# Patient Record
Sex: Male | Born: 1935
Health system: Southern US, Community
[De-identification: ages and names within clinical notes are randomized; demographics above are authoritative.]

## PROBLEM LIST (undated history)

## (undated) DIAGNOSIS — I639 Cerebral infarction, unspecified: Secondary | ICD-10-CM

## (undated) DIAGNOSIS — E119 Type 2 diabetes mellitus without complications: Secondary | ICD-10-CM

## (undated) DIAGNOSIS — C44611 Basal cell carcinoma of skin of unspecified upper limb, including shoulder: Secondary | ICD-10-CM

## (undated) DIAGNOSIS — M1990 Unspecified osteoarthritis, unspecified site: Secondary | ICD-10-CM

## (undated) DIAGNOSIS — C44310 Basal cell carcinoma of skin of unspecified parts of face: Secondary | ICD-10-CM

## (undated) DIAGNOSIS — G252 Other specified forms of tremor: Secondary | ICD-10-CM

## (undated) DIAGNOSIS — R202 Paresthesia of skin: Secondary | ICD-10-CM

## (undated) DIAGNOSIS — G43909 Migraine, unspecified, not intractable, without status migrainosus: Secondary | ICD-10-CM

## (undated) DIAGNOSIS — R079 Chest pain, unspecified: Secondary | ICD-10-CM

## (undated) HISTORY — PX: TONSILLECTOMY: SUR1361

## (undated) HISTORY — DX: Other specified forms of tremor: G25.2

## (undated) HISTORY — PX: HAMMER TOE SURGERY: SHX385

## (undated) HISTORY — PX: JOINT REPLACEMENT: SHX530

## (undated) HISTORY — DX: Chest pain, unspecified: R07.9

## (undated) HISTORY — DX: Paresthesia of skin: R20.2

## (undated) HISTORY — PX: BACK SURGERY: SHX140

## (undated) HISTORY — PX: LUMBAR LAMINECTOMY: SHX95

## (undated) HISTORY — DX: Type 2 diabetes mellitus without complications: E11.9

## (undated) HISTORY — DX: Unspecified osteoarthritis, unspecified site: M19.90

---

## 1998-07-13 ENCOUNTER — Ambulatory Visit (HOSPITAL_COMMUNITY): Admission: RE | Admit: 1998-07-13 | Discharge: 1998-07-13 | Payer: Self-pay | Admitting: Orthopaedic Surgery

## 1998-09-16 ENCOUNTER — Ambulatory Visit (HOSPITAL_COMMUNITY): Admission: RE | Admit: 1998-09-16 | Discharge: 1998-09-17 | Payer: Self-pay | Admitting: Orthopaedic Surgery

## 1999-07-13 ENCOUNTER — Ambulatory Visit (HOSPITAL_COMMUNITY): Admission: RE | Admit: 1999-07-13 | Discharge: 1999-07-13 | Payer: Self-pay | Admitting: Orthopaedic Surgery

## 2000-02-22 HISTORY — PX: KNEE ARTHROSCOPY: SUR90

## 2000-02-24 ENCOUNTER — Ambulatory Visit (HOSPITAL_COMMUNITY): Admission: RE | Admit: 2000-02-24 | Discharge: 2000-02-24 | Payer: Self-pay | Admitting: Orthopaedic Surgery

## 2000-04-05 ENCOUNTER — Inpatient Hospital Stay (HOSPITAL_COMMUNITY): Admission: AD | Admit: 2000-04-05 | Discharge: 2000-04-06 | Payer: Self-pay | Admitting: Orthopaedic Surgery

## 2003-02-19 ENCOUNTER — Encounter: Payer: Self-pay | Admitting: Internal Medicine

## 2003-02-22 DIAGNOSIS — I639 Cerebral infarction, unspecified: Secondary | ICD-10-CM

## 2003-02-22 HISTORY — DX: Cerebral infarction, unspecified: I63.9

## 2003-04-02 ENCOUNTER — Encounter: Payer: Self-pay | Admitting: Internal Medicine

## 2004-05-25 ENCOUNTER — Ambulatory Visit: Payer: Self-pay | Admitting: Internal Medicine

## 2004-10-15 ENCOUNTER — Ambulatory Visit: Payer: Self-pay | Admitting: Internal Medicine

## 2004-10-19 ENCOUNTER — Encounter: Payer: Self-pay | Admitting: Internal Medicine

## 2004-11-03 ENCOUNTER — Encounter: Payer: Self-pay | Admitting: Internal Medicine

## 2005-10-14 ENCOUNTER — Encounter: Payer: Self-pay | Admitting: Internal Medicine

## 2006-12-07 ENCOUNTER — Ambulatory Visit: Payer: Self-pay | Admitting: Internal Medicine

## 2006-12-07 DIAGNOSIS — R209 Unspecified disturbances of skin sensation: Secondary | ICD-10-CM | POA: Insufficient documentation

## 2006-12-07 DIAGNOSIS — G250 Essential tremor: Secondary | ICD-10-CM

## 2006-12-07 DIAGNOSIS — G252 Other specified forms of tremor: Secondary | ICD-10-CM

## 2006-12-07 DIAGNOSIS — M1990 Unspecified osteoarthritis, unspecified site: Secondary | ICD-10-CM

## 2006-12-07 DIAGNOSIS — M179 Osteoarthritis of knee, unspecified: Secondary | ICD-10-CM | POA: Insufficient documentation

## 2007-04-30 ENCOUNTER — Ambulatory Visit: Payer: Self-pay | Admitting: Internal Medicine

## 2007-04-30 DIAGNOSIS — L500 Allergic urticaria: Secondary | ICD-10-CM | POA: Insufficient documentation

## 2007-10-22 ENCOUNTER — Encounter: Payer: Self-pay | Admitting: Internal Medicine

## 2007-10-23 ENCOUNTER — Ambulatory Visit: Payer: Self-pay | Admitting: Internal Medicine

## 2007-10-23 DIAGNOSIS — E119 Type 2 diabetes mellitus without complications: Secondary | ICD-10-CM

## 2007-10-23 DIAGNOSIS — IMO0002 Reserved for concepts with insufficient information to code with codable children: Secondary | ICD-10-CM | POA: Insufficient documentation

## 2007-10-23 DIAGNOSIS — E1065 Type 1 diabetes mellitus with hyperglycemia: Secondary | ICD-10-CM

## 2007-10-23 HISTORY — DX: Type 2 diabetes mellitus without complications: E11.9

## 2007-10-23 LAB — CONVERTED CEMR LAB
ALT: 35 units/L (ref 0–53)
AST: 22 units/L (ref 0–37)
Albumin: 3.4 g/dL — ABNORMAL LOW (ref 3.5–5.2)
Alkaline Phosphatase: 99 units/L (ref 39–117)
BUN: 18 mg/dL (ref 6–23)
Basophils Absolute: 0 10*3/uL (ref 0.0–0.1)
Basophils Relative: 0.5 % (ref 0.0–3.0)
Bilirubin, Direct: 0.1 mg/dL (ref 0.0–0.3)
CO2: 28 meq/L (ref 19–32)
Calcium: 9 mg/dL (ref 8.4–10.5)
Chloride: 101 meq/L (ref 96–112)
Creatinine, Ser: 1 mg/dL (ref 0.4–1.5)
Eosinophils Absolute: 0 10*3/uL (ref 0.0–0.7)
Eosinophils Relative: 1.2 % (ref 0.0–5.0)
GFR calc Af Amer: 95 mL/min
GFR calc non Af Amer: 79 mL/min
Glucose, Bld: 719 mg/dL (ref 70–99)
HCT: 44.3 % (ref 39.0–52.0)
Hemoglobin: 15.2 g/dL (ref 13.0–17.0)
Hgb A1c MFr Bld: 15.7 % — ABNORMAL HIGH (ref 4.6–6.0)
Lymphocytes Relative: 20.1 % (ref 12.0–46.0)
MCHC: 34.3 g/dL (ref 30.0–36.0)
MCV: 89.2 fL (ref 78.0–100.0)
Monocytes Absolute: 0.3 10*3/uL (ref 0.1–1.0)
Monocytes Relative: 7.9 % (ref 3.0–12.0)
Neutro Abs: 2.9 10*3/uL (ref 1.4–7.7)
Neutrophils Relative %: 70.3 % (ref 43.0–77.0)
Platelets: 175 10*3/uL (ref 150–400)
Potassium: 5.7 meq/L — ABNORMAL HIGH (ref 3.5–5.1)
RBC: 4.97 M/uL (ref 4.22–5.81)
RDW: 12.7 % (ref 11.5–14.6)
Sodium: 132 meq/L — ABNORMAL LOW (ref 135–145)
Total Bilirubin: 1.1 mg/dL (ref 0.3–1.2)
Total Protein: 6.8 g/dL (ref 6.0–8.3)
WBC: 4 10*3/uL — ABNORMAL LOW (ref 4.5–10.5)

## 2007-10-30 ENCOUNTER — Ambulatory Visit: Payer: Self-pay | Admitting: Internal Medicine

## 2007-11-27 ENCOUNTER — Encounter: Payer: Self-pay | Admitting: Internal Medicine

## 2007-11-27 ENCOUNTER — Encounter: Admission: RE | Admit: 2007-11-27 | Discharge: 2007-11-27 | Payer: Self-pay | Admitting: Internal Medicine

## 2007-11-28 ENCOUNTER — Telehealth: Payer: Self-pay | Admitting: Internal Medicine

## 2007-12-05 ENCOUNTER — Ambulatory Visit: Payer: Self-pay | Admitting: Internal Medicine

## 2007-12-27 ENCOUNTER — Telehealth: Payer: Self-pay | Admitting: Internal Medicine

## 2008-01-21 ENCOUNTER — Ambulatory Visit: Payer: Self-pay | Admitting: Internal Medicine

## 2008-01-21 LAB — CONVERTED CEMR LAB
Creatinine,U: 30.3 mg/dL
Hgb A1c MFr Bld: 5.9 % (ref 4.6–6.0)
Microalb, Ur: 0.2 mg/dL (ref 0.0–1.9)

## 2008-02-25 ENCOUNTER — Ambulatory Visit: Payer: Self-pay | Admitting: Internal Medicine

## 2008-04-01 ENCOUNTER — Encounter: Payer: Self-pay | Admitting: Internal Medicine

## 2008-04-10 ENCOUNTER — Encounter: Payer: Self-pay | Admitting: Internal Medicine

## 2008-04-12 ENCOUNTER — Emergency Department (HOSPITAL_COMMUNITY): Admission: EM | Admit: 2008-04-12 | Discharge: 2008-04-12 | Payer: Self-pay | Admitting: Emergency Medicine

## 2008-04-22 ENCOUNTER — Encounter: Payer: Self-pay | Admitting: Internal Medicine

## 2008-05-27 ENCOUNTER — Ambulatory Visit: Payer: Self-pay | Admitting: Internal Medicine

## 2008-05-27 LAB — CONVERTED CEMR LAB: Hgb A1c MFr Bld: 5.7 % (ref 4.6–6.5)

## 2008-07-11 ENCOUNTER — Ambulatory Visit: Payer: Self-pay | Admitting: Family Medicine

## 2008-07-11 DIAGNOSIS — L03119 Cellulitis of unspecified part of limb: Secondary | ICD-10-CM

## 2008-07-11 DIAGNOSIS — L02619 Cutaneous abscess of unspecified foot: Secondary | ICD-10-CM | POA: Insufficient documentation

## 2008-07-12 ENCOUNTER — Encounter: Payer: Self-pay | Admitting: Family Medicine

## 2008-07-17 ENCOUNTER — Ambulatory Visit: Payer: Self-pay | Admitting: Internal Medicine

## 2008-07-17 DIAGNOSIS — L84 Corns and callosities: Secondary | ICD-10-CM | POA: Insufficient documentation

## 2008-08-04 ENCOUNTER — Encounter: Payer: Self-pay | Admitting: Internal Medicine

## 2008-08-12 ENCOUNTER — Encounter: Payer: Self-pay | Admitting: Internal Medicine

## 2008-08-26 ENCOUNTER — Ambulatory Visit: Payer: Self-pay | Admitting: Internal Medicine

## 2008-08-29 ENCOUNTER — Encounter: Payer: Self-pay | Admitting: Internal Medicine

## 2008-09-02 ENCOUNTER — Encounter: Payer: Self-pay | Admitting: Internal Medicine

## 2008-09-02 ENCOUNTER — Ambulatory Visit: Payer: Self-pay

## 2008-11-19 ENCOUNTER — Ambulatory Visit: Payer: Self-pay | Admitting: Internal Medicine

## 2008-11-19 DIAGNOSIS — I951 Orthostatic hypotension: Secondary | ICD-10-CM | POA: Insufficient documentation

## 2008-11-19 LAB — CONVERTED CEMR LAB: Hgb A1c MFr Bld: 5.4 % (ref 4.6–6.5)

## 2008-12-15 ENCOUNTER — Ambulatory Visit: Payer: Self-pay | Admitting: Internal Medicine

## 2009-02-26 ENCOUNTER — Ambulatory Visit: Payer: Self-pay | Admitting: Internal Medicine

## 2009-04-13 ENCOUNTER — Ambulatory Visit: Payer: Self-pay | Admitting: Internal Medicine

## 2009-07-13 ENCOUNTER — Ambulatory Visit: Payer: Self-pay | Admitting: Internal Medicine

## 2009-07-13 LAB — CONVERTED CEMR LAB: Hgb A1c MFr Bld: 5.6 % (ref 4.6–6.5)

## 2009-11-10 ENCOUNTER — Ambulatory Visit: Payer: Self-pay | Admitting: Internal Medicine

## 2009-11-10 DIAGNOSIS — R519 Headache, unspecified: Secondary | ICD-10-CM | POA: Insufficient documentation

## 2009-11-10 DIAGNOSIS — R51 Headache: Secondary | ICD-10-CM

## 2009-11-10 LAB — CONVERTED CEMR LAB: Blood Glucose, Fingerstick: 147

## 2010-03-21 LAB — CONVERTED CEMR LAB
ALT: 17 units/L (ref 0–53)
ALT: 27 units/L (ref 0–53)
AST: 19 units/L (ref 0–37)
AST: 20 units/L (ref 0–37)
Albumin: 3.6 g/dL (ref 3.5–5.2)
Alkaline Phosphatase: 61 units/L (ref 39–117)
BUN: 13 mg/dL (ref 6–23)
BUN: 19 mg/dL (ref 6–23)
Basophils Absolute: 0 10*3/uL (ref 0.0–0.1)
Basophils Relative: 0.7 % (ref 0.0–1.0)
Bilirubin, Direct: 0 mg/dL (ref 0.0–0.3)
Bilirubin, Direct: 0.2 mg/dL (ref 0.0–0.3)
CO2: 26 meq/L (ref 19–32)
Calcium: 9.5 mg/dL (ref 8.4–10.5)
Chloride: 110 meq/L (ref 96–112)
Cholesterol: 227 mg/dL (ref 0–200)
Cholesterol: 228 mg/dL — ABNORMAL HIGH (ref 0–200)
Creatinine, Ser: 1 mg/dL (ref 0.4–1.5)
Creatinine, Ser: 1 mg/dL (ref 0.4–1.5)
Direct LDL: 119.3 mg/dL
Eosinophils Absolute: 0.1 10*3/uL (ref 0.0–0.6)
Eosinophils Relative: 1.4 % (ref 0.0–5.0)
Eosinophils Relative: 1.8 % (ref 0.0–5.0)
Folate: 14.5 ng/mL
GFR calc Af Amer: 96 mL/min
GFR calc non Af Amer: 78.3 mL/min (ref >60)
GFR calc non Af Amer: 79 mL/min
Glucose, Bld: 122 mg/dL — ABNORMAL HIGH (ref 70–99)
HCT: 42.6 % (ref 39.0–52.0)
HDL: 33.1 mg/dL — ABNORMAL LOW (ref >39.0)
HDL: 38.7 mg/dL — ABNORMAL LOW (ref >39.00)
Hemoglobin: 15.1 g/dL (ref 13.0–17.0)
Lymphocytes Relative: 25.3 % (ref 12.0–46.0)
MCHC: 35.4 g/dL (ref 30.0–36.0)
MCV: 89.7 fL (ref 78.0–100.0)
Monocytes Absolute: 0.5 10*3/uL (ref 0.1–1.0)
Monocytes Absolute: 0.5 10*3/uL (ref 0.2–0.7)
Monocytes Relative: 10.4 % (ref 3.0–12.0)
Monocytes Relative: 9 % (ref 3.0–11.0)
Neutro Abs: 3.2 10*3/uL (ref 1.4–7.7)
Neutrophils Relative %: 61.8 % (ref 43.0–77.0)
Neutrophils Relative %: 63.6 % (ref 43.0–77.0)
PSA: 0.45 ng/mL (ref 0.10–4.00)
PSA: 0.51 ng/mL (ref 0.10–4.00)
Platelets: 191 10*3/uL (ref 150.0–400.0)
Platelets: 213 10*3/uL (ref 150–400)
Potassium: 4.5 meq/L (ref 3.5–5.1)
RBC: 4.75 M/uL (ref 4.22–5.81)
RDW: 12.7 % (ref 11.5–14.6)
Sodium: 142 meq/L (ref 135–145)
TSH: 2.17 microintl units/mL (ref 0.35–5.50)
Total Bilirubin: 0.7 mg/dL (ref 0.3–1.2)
Total Bilirubin: 1.2 mg/dL (ref 0.3–1.2)
Total CHOL/HDL Ratio: 6.9
Total Protein: 6.5 g/dL (ref 6.0–8.3)
Triglycerides: 352 mg/dL (ref 0–149)
VLDL: 61.2 mg/dL — ABNORMAL HIGH (ref 0.0–40.0)
VLDL: 70 mg/dL — ABNORMAL HIGH (ref 0–40)
Vitamin B-12: 250 pg/mL (ref 211–911)
WBC: 5.1 10*3/uL (ref 4.5–10.5)
WBC: 5.1 10*3/uL (ref 4.5–10.5)

## 2010-03-23 NOTE — Assessment & Plan Note (Signed)
Summary: HEADACHE // RS   Vital Signs:  Patient profile:   75 year old male Weight:      202 pounds Temp:     97.8 degrees F oral BP sitting:   110 / 80  (right arm) Cuff size:   regular  Vitals Entered By: Duard Brady LPN (November 10, 2009 8:04 AM) CC: c/o headache - on saturday - severe and with visual distrubance Is Patient Diabetic? Yes CBG Result 147  Vision Screening:Left eye w/o correction: 20 / 60 Right Eye w/o correction: 20 / 60 Both eyes w/o correction:  20/ 40        Vision Entered By: Duard Brady LPN (November 10, 2009 8:41 AM) Flu Vaccine Consent Questions     Do you have a history of severe allergic reactions to this vaccine? no    Any prior history of allergic reactions to egg and/or gelatin? no    Do you have a sensitivity to the preservative Thimersol? no    Do you have a past history of Guillan-Barre Syndrome? no    Do you currently have an acute febrile illness? no    Have you ever had a severe reaction to latex? no    Vaccine information given and explained to patient? yes    Are you currently pregnant? no    Lot Number:[id number]   Exp Date:08/21/2010   Site Given  Left Deltoid IM   CC:  c/o headache - on saturday - severe and with visual distrubance.  History of Present Illness: 75 -year-old patient who is seen today for follow-up of his type 2 diabetes.  Three days ago he began having some headaches associated with some visual disturbances.  That lasted approximately 30 minutes.  This occurred while he was out shopping over the weekend.  He continues to have some mild intermittent headaches in the frontal region.  His vision is back to baseline.  Headaches are relieved by aspirin.  He has type 2 diabetes which has been stable.  Hemoglobin A1c is have been in a non-diabetic range.  Allergies (verified): No Known Drug Allergies  Past History:  Past Medical History: Reviewed history from 02/25/2008 and no changes  required. Osteoarthritis Diabetes mellitus, type II  onset September 2009 intention tremor paresthesias of the feet  Family History: Reviewed history from 10/23/2007 and no changes required. mother died at 56- history of impaired glucose tolerance father died age 57, MI two brothers two sisters one brother deceased of a melanoma; history of diabetes, type II  Social History: Reviewed history from 12/15/2008 and no changes required. Never Smoked Regular exercise-yes  Review of Systems       The patient complains of vision loss and headaches.  The patient denies anorexia, fever, weight loss, weight gain, decreased hearing, hoarseness, chest pain, syncope, dyspnea on exertion, peripheral edema, prolonged cough, hemoptysis, abdominal pain, melena, hematochezia, severe indigestion/heartburn, hematuria, incontinence, genital sores, muscle weakness, suspicious skin lesions, transient blindness, difficulty walking, depression, unusual weight change, abnormal bleeding, enlarged lymph nodes, angioedema, breast masses, and testicular masses.    Physical Exam  General:  Well-developed,well-nourished,in no acute distress; alert,appropriate and cooperative throughout examination Head:  Normocephalic and atraumatic without obvious abnormalities. No apparent alopecia or balding. Eyes:  No corneal or conjunctival inflammation noted. EOMI. Perrla. Funduscopic exam benign, without hemorrhages, exudates or papilledema. Vision grossly normal. 20/40 OU Mouth:  Oral mucosa and oropharynx without lesions or exudates.  Teeth in good repair. Neck:  No deformities, masses, or tenderness noted. Lungs:  Normal respiratory effort, chest expands symmetrically. Lungs are clear to auscultation, no crackles or wheezes. Heart:  Normal rate and regular rhythm. S1 and S2 normal without gallop, murmur, click, rub or other extra sounds. Abdomen:  Bowel sounds positive,abdomen soft and non-tender without masses,  organomegaly or hernias noted. Msk:  No deformity or scoliosis noted of thoracic or lumbar spine.   Extremities:  No clubbing, cyanosis, edema, or deformity noted with normal full range of motion of all joints.    Diabetes Management Exam:    Eye Exam:       Eye Exam done here today          Results: normal   Impression & Recommendations:  Problem # 1:  DIABETES MELLITUS, TYPE II (ICD-250.00)  His updated medication list for this problem includes:    Glyburide-metformin 2.5-500 Mg Tabs (Glyburide-metformin) ..... One twice daily  Orders: Capillary Blood Glucose/CBG (16109) Venipuncture (60454) TLB-A1C / Hgb A1C (Glycohemoglobin) (83036-A1C) Specimen Handling (09811)  His updated medication list for this problem includes:    Glyburide-metformin 2.5-500 Mg Tabs (Glyburide-metformin) ..... One twice daily  Problem # 2:  HEADACHE (ICD-784.0)  Complete Medication List: 1)  Triamcinolone Acetonide 0.1 % Crea (Triamcinolone acetonide) .... Use twice daily 2)  Glyburide-metformin 2.5-500 Mg Tabs (Glyburide-metformin) .... One twice daily 3)  Bd Ultra-fine Pen Needles 29g X 12.73mm Misc (Insulin pen needle) .... Use daily 4)  Accu-chek Aviva Strp (Glucose blood) .... Use daily  Other Orders: Flu Vaccine 5yrs + MEDICARE PATIENTS (B1478) Administration Flu vaccine - MCR (G9562)  Patient Instructions: 1)  ophthalmology evaluation this week 2)  call if  visual disturbances recur 3)  Take an Aspirin every day. 4)  Please schedule a follow-up appointment in 3 months. 5)  Limit your Sodium (Salt). 6)  It is important that you exercise regularly at least 20 minutes 5 times a week. If you develop chest pain, have severe difficulty breathing, or feel very tired , stop exercising immediately and seek medical attention. Prescriptions: ACCU-CHEK AVIVA  STRP (GLUCOSE BLOOD) use daily Brand medically necessary #100 x 7   Entered and Authorized by:   Gordy Savers  MD   Signed by:   Gordy Savers  MD on 11/10/2009   Method used:   Electronically to        [facility] (retail)       [address]       [place], [place]  [address]       Ph: [phone] or [phone]       Fax: [phone]   RxID:   [id number] BD ULTRA-FINE PEN NEEDLES 29G X 12.7MM MISC (INSULIN PEN NEEDLE) use daily  #100 x 5   Entered and Authorized by:   Gordy Savers  MD   Signed by:   Gordy Savers  MD on 11/10/2009   Method used:   Electronically to        [facility] (retail)       [address]       [place], [place]  [address]       Ph: [phone] or [phone]       Fax: [phone]   RxID:   [id number] GLYBURIDE-METFORMIN 2.5-500 MG TABS (GLYBURIDE-METFORMIN) one twice daily  #180 Tablet x 5   Entered and Authorized by:   Gordy Savers  MD   Signed by:   Gordy Savers  MD on 11/10/2009   Method used:  Electronically to        [facility]* (retail)       [address]       [place], [place]  [address]       Ph: [phone] or [phone]       Fax: [phone]   RxID:   [id number]

## 2010-03-23 NOTE — Assessment & Plan Note (Signed)
Summary: 3 MONTH ROV/NJR   Vital Signs:  Patient profile:   75 year old male Weight:      208 pounds Temp:     98.5 degrees F oral BP sitting:   118 / 70  (right arm) Cuff size:   regular  Vitals Entered By: Duard Brady LPN (Jul 13, 2009 8:57 AM) CC: rov - doing well       fbs87 Is Patient Diabetic? Yes Did you bring your meter with you today? No   CC:  rov - doing well       fbs87.  History of Present Illness: 75 year old patient who has a history of type 2 diabetes.  Last visit his Lantus was titrated and presently is on oral agents only.  He has osteoarthritis, which has been fairly stable.  No concerns or complaints today.  He has had no hypoglycemic reactions.  His weight is down 2 pounds.  He continues to need much better avoiding fried foods and starches  Allergies (verified): No Known Drug Allergies  Past History:  Past Medical History: Reviewed history from 02/25/2008 and no changes required. Osteoarthritis Diabetes mellitus, type II  onset September 2009 intention tremor paresthesias of the feet  Review of Systems  The patient denies anorexia, fever, weight loss, weight gain, vision loss, decreased hearing, hoarseness, chest pain, syncope, dyspnea on exertion, peripheral edema, prolonged cough, headaches, hemoptysis, abdominal pain, melena, hematochezia, severe indigestion/heartburn, hematuria, incontinence, genital sores, muscle weakness, suspicious skin lesions, transient blindness, difficulty walking, depression, unusual weight change, abnormal bleeding, enlarged lymph nodes, angioedema, breast masses, and testicular masses.    Physical Exam  General:  Well-developed,well-nourished,in no acute distress; alert,appropriate and cooperative throughout examination Head:  Normocephalic and atraumatic without obvious abnormalities. No apparent alopecia or balding. Eyes:  No corneal or conjunctival inflammation noted. EOMI. Perrla. Funduscopic exam benign,  without hemorrhages, exudates or papilledema. Vision grossly normal. Mouth:  Oral mucosa and oropharynx without lesions or exudates.  Teeth in good repair. Neck:  No deformities, masses, or tenderness noted. Lungs:  Normal respiratory effort, chest expands symmetrically. Lungs are clear to auscultation, no crackles or wheezes. Heart:  Normal rate and regular rhythm. S1 and S2 normal without gallop, murmur, click, rub or other extra sounds. Abdomen:  Bowel sounds positive,abdomen soft and non-tender without masses, organomegaly or hernias noted. Msk:  No deformity or scoliosis noted of thoracic or lumbar spine.   Pulses:  R and L carotid,radial,femoral,dorsalis pedis and posterior tibial pulses are full and equal bilaterally   Impression & Recommendations:  Problem # 1:  DIABETES MELLITUS, TYPE II (ICD-250.00)  His updated medication list for this problem includes:    Glyburide-metformin 2.5-500 Mg Tabs (Glyburide-metformin) ..... One twice daily    His updated medication list for this problem includes:    Glyburide-metformin 2.5-500 Mg Tabs (Glyburide-metformin) ..... One twice daily  Problem # 2:  OSTEOARTHRITIS (ICD-715.90)  Complete Medication List: 1)  Triamcinolone Acetonide 0.1 % Crea (Triamcinolone acetonide) .... Use twice daily 2)  Glyburide-metformin 2.5-500 Mg Tabs (Glyburide-metformin) .... One twice daily 3)  Bd Ultra-fine Pen Needles 29g X 12.39mm Misc (Insulin pen needle) .... Use daily 4)  Accu-chek Aviva Strp (Glucose blood) .... Use daily  Other Orders: Venipuncture (88416) TLB-A1C / Hgb A1C (Glycohemoglobin) (83036-A1C) Prescription Created Electronically [id number])  Patient Instructions: 1)  Please schedule a follow-up appointment in 4 months. 2)  Limit your Sodium (Salt). 3)  It is important that you exercise regularly at least 20 minutes 5 times a week.  If you develop chest pain, have severe difficulty breathing, or feel very tired , stop exercising immediately  and seek medical attention. 4)  Check your blood sugars regularly. If your readings are usually above : or below 70 you should contact our office. 5)  It is important that your Diabetic A1c level is checked every 3 months. 6)  See your eye doctor yearly to check for diabetic eye damage. Prescriptions: ACCU-CHEK AVIVA  STRP (GLUCOSE BLOOD) use daily Brand medically necessary #100 x 7   Entered and Authorized by:   Gordy Savers  MD   Signed by:   Gordy Savers  MD on 07/13/2009   Method used:   Electronically to        [facility] (retail)       [address]       [place], [place]  [address]       Ph: [phone] or [phone]       Fax: [phone]   RxID:   [id number] BD ULTRA-FINE PEN NEEDLES 29G X 12.7MM MISC (INSULIN PEN NEEDLE) use daily  #100 x 5   Entered and Authorized by:   Gordy Savers  MD   Signed by:   Gordy Savers  MD on 07/13/2009   Method used:   Electronically to        [facility] (retail)       [address]       [place], [place]  [address]       Ph: [phone] or [phone]       Fax: [phone]   RxID:   [id number] GLYBURIDE-METFORMIN 2.5-500 MG TABS (GLYBURIDE-METFORMIN) one twice daily  #180 Tablet x 5   Entered and Authorized by:   Gordy Savers  MD   Signed by:   Gordy Savers  MD on 07/13/2009   Method used:   Electronically to        [facility] (retail)       [address]       [place], [place]  [address]       Ph: [phone] or [phone]       Fax: [phone]   RxID:   [id number]

## 2010-03-23 NOTE — Assessment & Plan Note (Signed)
Summary: ROA X 4 MTHS / RS   Vital Signs:  Patient profile:   75 year old male Weight:      213 pounds Temp:     98.6 degrees F BP sitting:   120 / 70  (left arm) Cuff size:   regular  Vitals Entered By: Duard Brady LPN (April 13, 2009 8:36 AM) CC: 4 mos rov    fbs 67 Is Patient Diabetic? Yes   CC:  4 mos rov    fbs 67.  History of Present Illness: 59 -year-old male, who is seen today for follow-up of his diabetes.  His Lantus has been down titrated to 12 units at bedtime.  He remains on oral medications.  Denies any frank hypoglycemia, but does state that some fasting blood sugars are in the 60s.  His last two hemoglobin A1c's have been 5.4.  Otherwise, doing quite well.  He has a history of arthritis, which has been stable.  Preventive Screening-Counseling & Management  Alcohol-Tobacco     Smoking Status: never  Allergies: No Known Drug Allergies  Past History:  Past Medical History: Reviewed history from 02/25/2008 and no changes required. Osteoarthritis Diabetes mellitus, type II  onset September 2009 intention tremor paresthesias of the feet  Review of Systems  The patient denies anorexia, fever, weight loss, weight gain, vision loss, decreased hearing, hoarseness, chest pain, syncope, dyspnea on exertion, peripheral edema, prolonged cough, headaches, hemoptysis, abdominal pain, melena, hematochezia, severe indigestion/heartburn, hematuria, incontinence, genital sores, muscle weakness, suspicious skin lesions, transient blindness, difficulty walking, depression, unusual weight change, abnormal bleeding, enlarged lymph nodes, angioedema, breast masses, and testicular masses.    Physical Exam  General:  Well-developed,well-nourished,in no acute distress; alert,appropriate and cooperative throughout examination Eyes:  No corneal or conjunctival inflammation noted. EOMI. Perrla. Funduscopic exam benign, without hemorrhages, exudates or papilledema. Vision  grossly normal. Mouth:  Oral mucosa and oropharynx without lesions or exudates.  Teeth in good repair. Neck:  No deformities, masses, or tenderness noted. Lungs:  Normal respiratory effort, chest expands symmetrically. Lungs are clear to auscultation, no crackles or wheezes. Heart:  Normal rate and regular rhythm. S1 and S2 normal without gallop, murmur, click, rub or other extra sounds. Abdomen:  Bowel sounds positive,abdomen soft and non-tender without masses, organomegaly or hernias noted. Msk:  No deformity or scoliosis noted of thoracic or lumbar spine.   Pulses:  R and L carotid,radial,femoral,dorsalis pedis and posterior tibial pulses are full and equal bilaterally   Impression & Recommendations:  Problem # 1:  DIABETES MELLITUS, TYPE II (ICD-250.00)  The following medications were removed from the medication list:    Lantus Solostar 100 Unit/ml Soln (Insulin glargine) .Marland Kitchen... 14 units at bedtime His updated medication list for this problem includes:    Glyburide-metformin 2.5-500 Mg Tabs (Glyburide-metformin) ..... One twice daily    The following medications were removed from the medication list:    Lantus Solostar 100 Unit/ml Soln (Insulin glargine) .Marland Kitchen... 14 units at bedtime His updated medication list for this problem includes:    Glyburide-metformin 2.5-500 Mg Tabs (Glyburide-metformin) ..... One twice daily  Problem # 2:  OSTEOARTHRITIS (ICD-715.90)  Complete Medication List: 1)  Triamcinolone Acetonide 0.1 % Crea (Triamcinolone acetonide) .... Use twice daily 2)  Glyburide-metformin 2.5-500 Mg Tabs (Glyburide-metformin) .... One twice daily 3)  Bd Ultra-fine Pen Needles 29g X 12.2mm Misc (Insulin pen needle) .... Use daily 4)  Accu-chek Aviva Strp (Glucose blood) .... Use daily  Other Orders: Prescription Created Electronically [id number])  Patient  Instructions: 1)  Please schedule a follow-up appointment in 3 months. 2)  It is important that you exercise regularly at  least 20 minutes 5 times a week. If you develop chest pain, have severe difficulty breathing, or feel very tired , stop exercising immediately and seek medical attention. 3)  You need to lose weight. Consider a lower calorie diet and regular exercise.  4)  Check your blood sugars regularly. If your readings are usually above : or below 70 you should contact our office. 5)  It is important that your Diabetic A1c level is checked every 3 months. 6)  See your eye doctor yearly to check for diabetic eye damage. Prescriptions: ACCU-CHEK AVIVA  STRP (GLUCOSE BLOOD) use daily Brand medically necessary #100 x 7   Entered and Authorized by:   Gordy Savers  MD   Signed by:   Gordy Savers  MD on 04/13/2009   Method used:   Print then Give to Patient   RxID:   [id number] GLYBURIDE-METFORMIN 2.5-500 MG TABS (GLYBURIDE-METFORMIN) one twice daily  #180 Tablet x 5   Entered and Authorized by:   Gordy Savers  MD   Signed by:   Gordy Savers  MD on 04/13/2009   Method used:   Print then Give to Patient   RxID:   [id number] TRIAMCINOLONE ACETONIDE 0.1 %  CREA (TRIAMCINOLONE ACETONIDE) use twice daily  #120 gm x 2   Entered and Authorized by:   Gordy Savers  MD   Signed by:   Gordy Savers  MD on 04/13/2009   Method used:   Print then Give to Patient   RxID:   [id number] ACCU-CHEK AVIVA  STRP (GLUCOSE BLOOD) use daily Brand medically necessary #100 x 7   Entered and Authorized by:   Gordy Savers  MD   Signed by:   Gordy Savers  MD on 04/13/2009   Method used:   Electronically to        [facility] (retail)       [address]       [place], [place]  [address]       Ph: [phone] or [phone]       Fax: [phone]   RxID:   [id number] GLYBURIDE-METFORMIN 2.5-500 MG TABS (GLYBURIDE-METFORMIN) one twice daily  #180 Tablet x 5   Entered and Authorized by:   Gordy Savers  MD   Signed by:   Gordy Savers  MD on 04/13/2009   Method used:   Electronically to        [facility] (retail)       [address]       [place], [place]  [address]       Ph: [phone] or [phone]       Fax: [phone]   RxID:   [id number] TRIAMCINOLONE ACETONIDE 0.1 %  CREA (TRIAMCINOLONE ACETONIDE) use twice daily  #120 gm x 2   Entered and Authorized by:   Gordy Savers  MD   Signed by:   Gordy Savers  MD on 04/13/2009   Method used:   Electronically to        [facility] (retail)       [address]       [place], [place]  [address]       Ph: [phone] or [phone]       Fax: [phone]   RxID:   [id number]

## 2010-03-23 NOTE — Progress Notes (Signed)
Summary: [employer] [employer]   Imported By: Lennie Odor 08/13/2009 09:12:18  _____________________________________________________________________  External Attachment:    Type:   Image     Comment:   External Document

## 2010-03-23 NOTE — Letter (Signed)
Summary: Phone call from patient-wants lab results  Phone call from patient-wants lab results   Imported By: Maryln Gottron 07/16/2009 09:02:18  _____________________________________________________________________  External Attachment:    Type:   Image     Comment:   External Document

## 2010-03-23 NOTE — Op Note (Signed)
Summary: [employer] [employer]   Imported By: Lennie Odor 08/13/2009 09:10:35  _____________________________________________________________________  External Attachment:    Type:   Image     Comment:   External Document

## 2010-03-23 NOTE — Progress Notes (Signed)
Summary: Tyler Greer  Tyler Greer   Imported By: Maryln Gottron 07/15/2009 14:22:42  _____________________________________________________________________  External Attachment:    Type:   Image     Comment:   External Document

## 2010-03-23 NOTE — Progress Notes (Signed)
Summary: [employer] [employer]   Imported By: Lennie Odor 08/13/2009 09:13:17  _____________________________________________________________________  External Attachment:    Type:   Image     Comment:   External Document

## 2010-03-23 NOTE — Assessment & Plan Note (Signed)
Summary: [name]   Vital Signs:  Patient profile:   75 year old male Weight:      211 pounds BMI:     28.72 Temp:     98.0 degrees F oral BP sitting:   114 / 78  (left arm) Cuff size:   regular  Vitals Entered By: Raechel Ache, RN (February 26, 2009 1:14 PM) CC: C/o itchy rash on back and ankles. BS 70no. Is Patient Diabetic? Yes   CC:  C/o itchy rash on back and ankles. BS 70no.Tyler Greer  History of Present Illness: 75 year old patient who is seen today for follow-up of his type 2 diabetes.  He has history of urticaria Osteoarthritis.  doing quite well except for a very pruritic rash involving his upper back and right medial ankle.  His diabetes has been under excellent control.  Blood sugars fasting are often in the 70 range  Allergies: No Known Drug Allergies  Past History:  Past Medical History: Reviewed history from 02/25/2008 and no changes required. Osteoarthritis Diabetes mellitus, type II  onset September 2009 intention tremor paresthesias of the feet  Review of Systems       The patient complains of suspicious skin lesions.  The patient denies anorexia, fever, weight loss, weight gain, vision loss, decreased hearing, hoarseness, chest pain, syncope, dyspnea on exertion, peripheral edema, prolonged cough, headaches, hemoptysis, abdominal pain, melena, hematochezia, severe indigestion/heartburn, hematuria, incontinence, genital sores, muscle weakness, transient blindness, difficulty walking, depression, unusual weight change, abnormal bleeding, enlarged lymph nodes, angioedema, breast masses, and testicular masses.    Physical Exam  General:  Well-developed,well-nourished,in no acute distress; alert,appropriate and cooperative throughout examination Head:  Normocephalic and atraumatic without obvious abnormalities. No apparent alopecia or balding. Eyes:  No corneal or conjunctival inflammation noted. EOMI. Perrla. Funduscopic exam benign, without hemorrhages,  exudates or papilledema. Vision grossly normal. Mouth:  Oral mucosa and oropharynx without lesions or exudates.  Teeth in good repair. Neck:  No deformities, masses, or tenderness noted. Lungs:  Normal respiratory effort, chest expands symmetrically. Lungs are clear to auscultation, no crackles or wheezes. Heart:  Normal rate and regular rhythm. S1 and S2 normal without gallop, murmur, click, rub or other extra sounds. Abdomen:  Bowel sounds positive,abdomen soft and non-tender without masses, organomegaly or hernias noted. Skin:  dry flaky slightly erythematous rash involving his upper shoulder area, as well as his right medial ankle.   Impression & Recommendations:  Problem # 1:  DIABETES MELLITUS, TYPE II (ICD-250.00)  His updated medication list for this problem includes:    Glyburide-metformin 2.5-500 Mg Tabs (Glyburide-metformin) ..... One twice daily    Lantus Solostar 100 Unit/ml Soln (Insulin glargine) .Tyler Greer... 14 units at bedtime    His updated medication list for this problem includes:    Glyburide-metformin 2.5-500 Mg Tabs (Glyburide-metformin) ..... One twice daily    Lantus Solostar 100 Unit/ml Soln (Insulin glargine) .Tyler Greer... 14 units at bedtime  Problem # 2:  URTICARIA, ALLERGIC (ICD-708.0)  Complete Medication List: 1)  Triamcinolone Acetonide 0.1 % Crea (Triamcinolone acetonide) .... Use twice daily 2)  Glyburide-metformin 2.5-500 Mg Tabs (Glyburide-metformin) .... One twice daily 3)  Lantus Solostar 100 Unit/ml Soln (Insulin glargine) .Tyler Greer.. 14 units at bedtime 4)  Bd Ultra-fine Pen Needles 29g X 12.63mm Misc (Insulin pen needle) .... Use daily 5)  Accu-chek Aviva Strp (Glucose blood) .... Use daily  Other Orders: Venipuncture (60454) TLB-A1C / Hgb A1C (Glycohemoglobin) (83036-A1C)  Patient Instructions: 1)  Please schedule a follow-up appointment  in 4 months. 2)  Limit your Sodium (Salt). 3)  It is important that you exercise regularly at least 20 minutes 5 times a  week. If you develop chest pain, have severe difficulty breathing, or feel very tired , stop exercising immediately and seek medical attention. 4)  Check your blood sugars regularly. If your readings are usually above : or below 70 you should contact our office. 5)  It is important that your Diabetic A1c level is checked every 3 months. 6)  See your eye doctor yearly to check for diabetic eye damage. Prescriptions: TRIAMCINOLONE ACETONIDE 0.1 %  CREA (TRIAMCINOLONE ACETONIDE) use twice daily  #120 gm x 2   Entered and Authorized by:   Gordy Savers  MD   Signed by:   Gordy Savers  MD on 02/26/2009   Method used:   Print then Give to Patient   RxID:   [id number]

## 2010-04-23 ENCOUNTER — Encounter: Payer: Self-pay | Admitting: Internal Medicine

## 2010-04-26 ENCOUNTER — Encounter: Payer: Self-pay | Admitting: Internal Medicine

## 2010-04-26 ENCOUNTER — Ambulatory Visit (INDEPENDENT_AMBULATORY_CARE_PROVIDER_SITE_OTHER): Payer: Medicare Other | Admitting: Internal Medicine

## 2010-04-26 DIAGNOSIS — I739 Peripheral vascular disease, unspecified: Secondary | ICD-10-CM

## 2010-04-26 DIAGNOSIS — N529 Male erectile dysfunction, unspecified: Secondary | ICD-10-CM

## 2010-04-26 DIAGNOSIS — E119 Type 2 diabetes mellitus without complications: Secondary | ICD-10-CM

## 2010-04-26 DIAGNOSIS — E1065 Type 1 diabetes mellitus with hyperglycemia: Secondary | ICD-10-CM

## 2010-04-26 DIAGNOSIS — M1990 Unspecified osteoarthritis, unspecified site: Secondary | ICD-10-CM

## 2010-04-26 LAB — TESTOSTERONE: Testosterone: 334.01 ng/dL — ABNORMAL LOW (ref 350.00–890.00)

## 2010-04-26 LAB — HEMOGLOBIN A1C: Hgb A1c MFr Bld: 5.7 % (ref 4.6–6.5)

## 2010-04-26 MED ORDER — METFORMIN HCL ER 500 MG PO TB24: ORAL_TABLET | ORAL | Status: DC

## 2010-04-26 NOTE — Patient Instructions (Signed)
Limit your sodium (Salt) intake   Please check your hemoglobin A1c every 3 months    It is important that you exercise regularly, at least 20 minutes 3 to 4 times per week.  If you develop chest pain or shortness of breath seek  medical attention.   

## 2010-04-26 NOTE — Progress Notes (Signed)
  Subjective:    Patient ID: Tyler Greer, male    DOB: Apr 12, 1935, 75 y.o.   MRN: [id number]  HPI  75 Year old patient who is in today for followup of his type 2 diabetes. He has been on combination there being in blood sugars have been tightly controlled. Hemoglobin A1c is consistently less than 6. A random blood sugar today 69  and He has a history of claudication which has been stable. He has osteoarthritis . Main complaint today is erectile dysfunction. He has tried Viagra in the past which caused significant flushing.    Review of Systems  Constitutional: Negative for fever, chills, appetite change and fatigue.  HENT: Negative for hearing loss, ear pain, congestion, sore throat, trouble swallowing, neck stiffness, dental problem, voice change and tinnitus.   Eyes: Negative for pain, discharge and visual disturbance.  Respiratory: Negative for cough, chest tightness, wheezing and stridor.   Cardiovascular: Negative for chest pain, palpitations and leg swelling.  Gastrointestinal: Negative for nausea, vomiting, abdominal pain, diarrhea, constipation, blood in stool and abdominal distention.  Genitourinary: Negative for urgency, hematuria, flank pain, discharge, difficulty urinating and genital sores.  Musculoskeletal: Negative for myalgias, back pain, joint swelling, arthralgias and gait problem.  Skin: Negative for rash.  Neurological: Negative for dizziness, syncope, speech difficulty, weakness, numbness and headaches.  Hematological: Negative for adenopathy. Does not bruise/bleed easily.  Psychiatric/Behavioral: Negative for behavioral problems and dysphoric mood. The patient is not nervous/anxious.        Objective:   Physical Exam  Constitutional: He is oriented to person, place, and time. He appears well-developed.  HENT:  Head: Normocephalic.  Right Ear: External ear normal.  Left Ear: External ear normal.  Eyes: Conjunctivae and EOM are normal.  Neck: Normal range of  motion.  Cardiovascular: Normal rate and normal heart sounds.   Pulmonary/Chest: Breath sounds normal.  Abdominal: Bowel sounds are normal.  Musculoskeletal: Normal range of motion. He exhibits no edema and no tenderness.  Neurological: He is alert and oriented to person, place, and time.  Psychiatric: He has a normal mood and affect. His behavior is normal.          Assessment & Plan:   diabetes mellitus. We'll discontinued combination therapy in attempt to control on metformin alone  Claudication stable  Osteoarthritis stable  Erectile dysfunction. We'll check a testosterone level and give a trial of Levitra

## 2010-04-27 ENCOUNTER — Other Ambulatory Visit: Payer: Self-pay | Admitting: Internal Medicine

## 2010-04-27 MED ORDER — TESTOSTERONE 12.5 MG/ACT (1%) TD GEL: 4.0000 | Freq: Every day | TRANSDERMAL | Status: DC

## 2010-04-27 NOTE — Progress Notes (Signed)
Quick Note:  spke with pt - made aware of test results - he does wish to do replacement tx depending on cost. I informed it may require a pre auth. From ins. Co.  Rx. Sent to [facility] pharmacy ______

## 2010-04-27 NOTE — Progress Notes (Signed)
Quick Note:  Attempt to call - VM - LMTCB to discuss labs ______

## 2010-04-28 ENCOUNTER — Telehealth: Payer: Self-pay

## 2010-04-28 NOTE — Telephone Encounter (Signed)
error 

## 2010-04-30 ENCOUNTER — Telehealth: Payer: Self-pay

## 2010-04-30 NOTE — Telephone Encounter (Signed)
Pt in office inquiring about androgell rx - not filled yet.   Called Bob at [facility] to comfirm that they had rx - had been filled 3/7 Informed pt. KIK

## 2010-05-14 ENCOUNTER — Telehealth: Payer: Self-pay | Admitting: Internal Medicine

## 2010-05-14 NOTE — Telephone Encounter (Signed)
Pt was given last office visit note .

## 2010-05-14 NOTE — Telephone Encounter (Signed)
Pt came by office and needs to get documentation from when he was contacted about his potassium level being low. Pt got a ticket and has to appear in court. Pt says having low potassium makes him drowsy. Need to get this today.

## 2010-06-08 LAB — GLUCOSE, CAPILLARY: Glucose-Capillary: 102 mg/dL — ABNORMAL HIGH (ref 70–99)

## 2010-07-09 NOTE — Op Note (Signed)
[place]  Patient:    Tyler Greer, Tyler Greer                     MRN: [id number] Proc. Date: 04/05/00 Adm. Date:  [id number] Attending:  Jacki Cones                           Operative Report  PREOPERATIVE DIAGNOSIS:  L3-4 spinal stenosis.  POSTOPERATIVE DIAGNOSIS:  L3-4 spinal stenosis.  PROCEDURE:  L3-4 decompression, foraminotomy.  SURGEON:  Mark C. Ophelia Charter, M.D.  ASSISTANT:  Cammy Copa, M.D.  ANESTHESIA:  GOT.  ESTIMATED BLOOD LOSS:  200 ml.  DESCRIPTION OF PROCEDURE:  After induction of general anesthesia, orotracheal intubation, patient placed on the Andrews frame with careful padding and positioning.  Area was covered with towels, Betadine Vi-Drape applied, and laminectomy sheet and drapes.  Old incisions was inspected.  Needles were placed after palpation to approximate the area for planned decompression just above and below the L3-4 disk space.  Plan was for placement of the needles at the midbody position above and below, which corresponded to the areas of severe spinal stenosis on MRI scan.  X-ray was taken, which shows the top needle was at the top level of the inferior aspect of the pedicle as planned, midbody on 3, and inferior needle was just below the end plate of the disk, and a sterile skin marker was used to mark the drapes and lines drawn across to the exact level of decompression. Old midline incision top aspect was used, and it was extended, subcutaneous tissue sharply dissected with the Bovie electrocautery.  Subperiosteal dissection of the lamina.  Self-retaining retractor was placed.  Posterior elements were removed from the planned area of decompression, taking the spinous process and lamina of L3.  The top portion of L4 spinous process and top portion of the lamina was removed.  Entire lamina of L3 was removed. There was severe facet overgrowth and thick, hypertrophic ligamentum, which was removed in  large chunks.  Decompression started at the midline and extended up to the top of L3 lamina, then extended right and left out to the level of the pedicles of both sides.  Osteotomes were used to remove the portion of the medial facet that was spur and overhang.  Continued undercutting out to the level of the pedicle was performed until there followed decompression from the inferior aspect of the L3 pedicle to the midportion L4 pedicle.  Bone was removed and with palpation with the hockey stick, the L4 pedicles were free and smooth.  Disk was soft.  There were multiple small, flat epidural veins present.  Nerve root was inspected, L3 and L4 nerve root on both the right and left side, and palpation ventral to the nerve root as well as on the shoulder, and there were no areas of remaining compression.  Sweep could be made anterior to the dura along with this, 180 degree sweep from both sides nice and free.  The hour-glass severe deformity of the dura was well-decompressed, and there was space for a hockey stick to e placed on each side, not touching the dura as it was placed down the lateral gutter to the level of the disk and down to the pedicle above and below with decompression parallel to the medial edge of the pedicle on each side.  After repeat irrigation, x-ray was taken with hockey stick at the top and  a #4 Penfield at the bottom, showing the planned decompression.  Repeat irrigation, suctioning dry, closure of the fascia with 0 Vicryl, 2-0 Vicryl in the subcutaneous tissue, Marcaine infiltration in the skin, skin staple closure, and postoperative dressing, and was transferred to the recovery room. Instrument count and needle count was correct. DD:  04/05/00 TD:  04/06/00 Job: [id number]

## 2010-07-27 ENCOUNTER — Encounter: Payer: Self-pay | Admitting: Internal Medicine

## 2010-07-27 ENCOUNTER — Ambulatory Visit (INDEPENDENT_AMBULATORY_CARE_PROVIDER_SITE_OTHER): Payer: Medicare Other | Admitting: Internal Medicine

## 2010-07-27 DIAGNOSIS — E785 Hyperlipidemia, unspecified: Secondary | ICD-10-CM

## 2010-07-27 DIAGNOSIS — E349 Endocrine disorder, unspecified: Secondary | ICD-10-CM | POA: Insufficient documentation

## 2010-07-27 DIAGNOSIS — E119 Type 2 diabetes mellitus without complications: Secondary | ICD-10-CM

## 2010-07-27 DIAGNOSIS — R209 Unspecified disturbances of skin sensation: Secondary | ICD-10-CM

## 2010-07-27 DIAGNOSIS — M1990 Unspecified osteoarthritis, unspecified site: Secondary | ICD-10-CM

## 2010-07-27 DIAGNOSIS — E291 Testicular hypofunction: Secondary | ICD-10-CM

## 2010-07-27 DIAGNOSIS — G250 Essential tremor: Secondary | ICD-10-CM

## 2010-07-27 LAB — GLUCOSE, POCT (MANUAL RESULT ENTRY): POC Glucose: 110

## 2010-07-27 NOTE — Patient Instructions (Signed)
It is important that you exercise regularly, at least 20 minutes 3 to 4 times per week.  If you develop chest pain or shortness of breath seek  medical attention.  Return in 4 months for follow-up  

## 2010-07-27 NOTE — Progress Notes (Signed)
  Subjective:    Patient ID: Tyler Greer, male    DOB: February 13, 1936, 75 y.o.   MRN: [id number]  HPI  is a 75 year old patient who is in today for followup. He has a history of type 2 diabetes that has been well controlled over the past 2 years on combination therapy uncontrolled diabetes with hypoglycemic symptoms and a random blood sugar over 700. His hemoglobin A1c's are consistently less than 6. He has a history of osteoarthritis and has been followed by Dr. Ophelia Charter in the past he has required back surgery. He walks daily in spite of some knee pain. He has a history of testosterone deficiency but no longer uses supplemental medication. He takes daily aspirin in addition to his diabetic medication he does check blood sugars occasionally with all with normal results. He has a history of claudication. 2 years ago he presented with    Review of Systems  Constitutional: Negative for fever, chills, appetite change and fatigue.  HENT: Negative for hearing loss, ear pain, congestion, sore throat, trouble swallowing, neck stiffness, dental problem, voice change and tinnitus.   Eyes: Negative for pain, discharge and visual disturbance.  Respiratory: Negative for cough, chest tightness, wheezing and stridor.   Cardiovascular: Negative for chest pain, palpitations and leg swelling.  Gastrointestinal: Negative for nausea, vomiting, abdominal pain, diarrhea, constipation, blood in stool and abdominal distention.  Genitourinary: Negative for urgency, hematuria, flank pain, discharge, difficulty urinating and genital sores.  Musculoskeletal: Negative for myalgias, back pain, joint swelling, arthralgias and gait problem.  Skin: Negative for rash.  Neurological: Negative for dizziness, syncope, speech difficulty, weakness, numbness and headaches.  Hematological: Negative for adenopathy. Does not bruise/bleed easily.  Psychiatric/Behavioral: Negative for behavioral problems and dysphoric mood. The patient is not  nervous/anxious.        Objective:   Physical Exam  Constitutional: He is oriented to person, place, and time. He appears well-developed.  HENT:  Head: Normocephalic.  Right Ear: External ear normal.  Left Ear: External ear normal.  Eyes: Conjunctivae and EOM are normal.  Neck: Normal range of motion.  Cardiovascular: Normal rate and normal heart sounds.        Posterior tibia pulses full  Pulmonary/Chest: Breath sounds normal.  Abdominal: Bowel sounds are normal.  Musculoskeletal: Normal range of motion. He exhibits no edema and no tenderness.  Neurological: He is alert and oriented to person, place, and time.  Psychiatric: He has a normal mood and affect. His behavior is normal.          Assessment & Plan:  Diabetes mellitus. Well controlled. We'll continue his present regimen Osteoarthritis. Stable Testosterone deficiency. We'll continue him off medication Mild dyslipidemia. We'll check a fasting lipid profile and consider statin therapy at his next visit

## 2010-11-26 ENCOUNTER — Ambulatory Visit (INDEPENDENT_AMBULATORY_CARE_PROVIDER_SITE_OTHER): Payer: Medicare Other | Admitting: Internal Medicine

## 2010-11-26 ENCOUNTER — Encounter: Payer: Self-pay | Admitting: Internal Medicine

## 2010-11-26 DIAGNOSIS — Z0000 Encounter for general adult medical examination without abnormal findings: Secondary | ICD-10-CM

## 2010-11-26 DIAGNOSIS — Z23 Encounter for immunization: Secondary | ICD-10-CM

## 2010-11-26 DIAGNOSIS — Z125 Encounter for screening for malignant neoplasm of prostate: Secondary | ICD-10-CM

## 2010-11-26 DIAGNOSIS — E119 Type 2 diabetes mellitus without complications: Secondary | ICD-10-CM

## 2010-11-26 DIAGNOSIS — M1990 Unspecified osteoarthritis, unspecified site: Secondary | ICD-10-CM

## 2010-11-26 DIAGNOSIS — R209 Unspecified disturbances of skin sensation: Secondary | ICD-10-CM

## 2010-11-26 DIAGNOSIS — Z136 Encounter for screening for cardiovascular disorders: Secondary | ICD-10-CM

## 2010-11-26 DIAGNOSIS — E785 Hyperlipidemia, unspecified: Secondary | ICD-10-CM

## 2010-11-26 LAB — COMPREHENSIVE METABOLIC PANEL
ALT: 14 U/L (ref 0–53)
Albumin: 3.7 g/dL (ref 3.5–5.2)
CO2: 28 mEq/L (ref 19–32)
Calcium: 9 mg/dL (ref 8.4–10.5)
Chloride: 104 mEq/L (ref 96–112)
GFR: 90.25 mL/min (ref >60.00)
Glucose, Bld: 90 mg/dL (ref 70–99)
Potassium: 4.5 mEq/L (ref 3.5–5.1)
Sodium: 139 mEq/L (ref 135–145)
Total Protein: 6.5 g/dL (ref 6.0–8.3)

## 2010-11-26 LAB — GLUCOSE, POCT (MANUAL RESULT ENTRY): POC Glucose: 106

## 2010-11-26 LAB — CBC WITH DIFFERENTIAL/PLATELET
Basophils Absolute: 0 10*3/uL (ref 0.0–0.1)
Eosinophils Absolute: 0.1 10*3/uL (ref 0.0–0.7)
Hemoglobin: 13.9 g/dL (ref 13.0–17.0)
Lymphocytes Relative: 22.9 % (ref 12.0–46.0)
MCHC: 33.1 g/dL (ref 30.0–36.0)
Neutro Abs: 3.3 10*3/uL (ref 1.4–7.7)
Neutrophils Relative %: 66.7 % (ref 43.0–77.0)
Platelets: 194 10*3/uL (ref 150.0–400.0)
RDW: 14.6 % (ref 11.5–14.6)

## 2010-11-26 LAB — HEMOGLOBIN A1C: Hgb A1c MFr Bld: 6.7 % — ABNORMAL HIGH (ref 4.6–6.5)

## 2010-11-26 LAB — LIPID PANEL: Total CHOL/HDL Ratio: 5

## 2010-11-26 LAB — PSA: PSA: 0.49 ng/mL (ref 0.10–4.00)

## 2010-11-26 MED ORDER — GLUCOSE BLOOD VI STRP: ORAL_STRIP | Status: DC

## 2010-11-26 MED ORDER — GLYBURIDE-METFORMIN 2.5-500 MG PO TABS: 1.0000 | ORAL_TABLET | Freq: Two times a day (BID) | ORAL | Status: DC

## 2010-11-26 NOTE — Patient Instructions (Signed)
It is important that you exercise regularly, at least 20 minutes 3 to 4 times per week.  If you develop chest pain or shortness of breath seek  medical attention.   Please check your hemoglobin A1c every 3 months  Schedule your colonoscopy to help detect colon cancer.

## 2010-11-26 NOTE — Progress Notes (Signed)
Subjective:    Patient ID: Tyler Greer, male    DOB: [birth year], 75 y.o.   MRN: [id number]  HPI  History of Present Illness:   75 year-old patient who is seen today for a comprehensive examination. He has an approximate  3 year history diabetes mellitus. He presented with a random blood sugar is 719 about  3 years  ago and has been under excellent control. Since that time. Blood sugars are occasionally less than 70. Marland Kitchen He is also on oral medications. He has a history of lower extremity paresthesias and also an intention tremor. He has osteoarthritis. He has a history of mild testosterone deficiency  1. Risk factors, based on past  M,S,F history-  cardiovascular risk factors include a history of type 2 diabetes  2.  Physical activities: Remains quite active still works part-time walks daily in the mornings  3.  Depression/mood: No history depression or mood disorder 4.  Hearing:  No deficits  5.  ADL's: Independent in all aspects of daily living  6.  Fall risk: Low  7.  Home safety: No problems identified  8.  Height weight, and visual acuity; height and weight stable;  is scheduled to see a retinal specialist next month  9.  Counseling: Followup eye exam. He will consider a followup screening colonoscopy  10. Lab orders based on risk factors: Laboratory panel including hemoglobin A1c will be reviewed  11. Referral: Referral  12. Care plan: Continue a regular exercise program proper diet  13. Cognitive assessment: Alert and oriented normal affect. No cognitive dysfunction.      Preventive Screening-Counseling & Management  Alcohol-Tobacco  Smoking Status: never  Caffeine-Diet-Exercise  Does Patient Exercise: yes   Allergies:  No Known Drug Allergies   Past History:  Past Medical History:  Reviewed history from 02/25/2008 and no changes required.  Osteoarthritis  Diabetes mellitus, type II onset September 2009  intention tremor  paresthesias of the feet   Past  Surgical History:  Tonsillectomy  Lumbar laminectomyx 2  bilateral arthroscopic knee surgery 2002  colonoscopy approximately 2000  foot surgery for hammertoe deformities   Family History:  Reviewed history from 10/23/2007 and no changes required.  mother died at 67- history of impaired glucose tolerance  father died age 20, MI  two brothers two sisters  one brother deceased of a melanoma; history of diabetes, type II   Social History:   Widower Never Smoked  Regular exercise-yes  Smoking Status: never  Does Patient Exercise: yes      Review of Systems  Constitutional: Negative for fever, chills, activity change, appetite change and fatigue.  HENT: Negative for hearing loss, ear pain, congestion, rhinorrhea, sneezing, mouth sores, trouble swallowing, neck pain, neck stiffness, dental problem, voice change, sinus pressure and tinnitus.   Eyes: Negative for photophobia, pain, redness and visual disturbance.  Respiratory: Negative for apnea, cough, choking, chest tightness, shortness of breath and wheezing.   Cardiovascular: Negative for chest pain, palpitations and leg swelling.  Gastrointestinal: Negative for nausea, vomiting, abdominal pain, diarrhea, constipation, blood in stool, abdominal distention, anal bleeding and rectal pain.  Genitourinary: Negative for dysuria, urgency, frequency, hematuria, flank pain, decreased urine volume, discharge, penile swelling, scrotal swelling, difficulty urinating, genital sores and testicular pain.  Musculoskeletal: Negative for myalgias, back pain, joint swelling, arthralgias and gait problem.  Skin: Negative for color change, rash and wound.  Neurological: Negative for dizziness, tremors, seizures, syncope, facial asymmetry, speech difficulty, weakness, light-headedness, numbness and headaches.  Hematological: Negative  for adenopathy. Does not bruise/bleed easily.  Psychiatric/Behavioral: Negative for suicidal ideas, hallucinations,  behavioral problems, confusion, sleep disturbance, self-injury, dysphoric mood, decreased concentration and agitation. The patient is not nervous/anxious.        Objective:   Physical Exam  Constitutional: He appears well-developed and well-nourished.  HENT:  Head: Normocephalic and atraumatic.  Right Ear: External ear normal.  Left Ear: External ear normal.  Nose: Nose normal.  Mouth/Throat: Oropharynx is clear and moist.  Eyes: Conjunctivae and EOM are normal. Pupils are equal, round, and reactive to light. No scleral icterus.  Neck: Normal range of motion. Neck supple. No JVD present. No thyromegaly present.  Cardiovascular: Regular rhythm, normal heart sounds and intact distal pulses.  Exam reveals no gallop and no friction rub.   No murmur heard. Pulmonary/Chest: Effort normal and breath sounds normal. He exhibits no tenderness.  Abdominal: Soft. Bowel sounds are normal. He exhibits no distension and no mass. There is no tenderness.  Genitourinary: Prostate normal and penis normal.  Musculoskeletal: Normal range of motion. He exhibits no edema and no tenderness.  Lymphadenopathy:    He has no cervical adenopathy.  Neurological: He is alert. He has normal reflexes. No cranial nerve deficit. Coordination normal.       Mild decreased vibratory sensation of his feet   Skin: Skin is warm and dry. No rash noted.  Psychiatric: He has a normal mood and affect. His behavior is normal.          Assessment & Plan:    Preventive health examination Type 2 diabetes  We'll continue active lifestyle. His weight is ideal at present continue proper diet. Hemoglobin A1c in general I will be reviewed He will consider a followup colonoscopy. He is scheduled for followup eye exam

## 2011-04-07 ENCOUNTER — Encounter: Payer: Self-pay | Admitting: Internal Medicine

## 2011-04-14 ENCOUNTER — Encounter: Payer: Self-pay | Admitting: Internal Medicine

## 2011-04-14 ENCOUNTER — Ambulatory Visit (INDEPENDENT_AMBULATORY_CARE_PROVIDER_SITE_OTHER): Payer: Medicare Other | Admitting: Internal Medicine

## 2011-04-14 DIAGNOSIS — L84 Corns and callosities: Secondary | ICD-10-CM

## 2011-04-14 DIAGNOSIS — E119 Type 2 diabetes mellitus without complications: Secondary | ICD-10-CM | POA: Diagnosis not present

## 2011-04-14 DIAGNOSIS — R209 Unspecified disturbances of skin sensation: Secondary | ICD-10-CM

## 2011-04-14 LAB — HEMOGLOBIN A1C: Hgb A1c MFr Bld: 6 % (ref 4.6–6.5)

## 2011-04-14 NOTE — Progress Notes (Signed)
  Subjective:    Patient ID: Tyler Greer, male    DOB: 12/12/1935, 76 y.o.   MRN: [id number]  HPI  76 year old patient who is seen today for followup of his type 2 diabetes. This was diagnosed approximately 4 years ago and has been constipated by peripheral neuropathy. He describes a constant sense of coldness and numbness involving both feet. Last hemoglobin A1c of 6.7 but prior to this values have been less than 6. He feels well today except for his chronic paresthesias of the feet He has a prior history of foot cellulitis.    Review of Systems  Constitutional: Negative for fever, chills, appetite change and fatigue.  HENT: Negative for hearing loss, ear pain, congestion, sore throat, trouble swallowing, neck stiffness, dental problem, voice change and tinnitus.   Eyes: Negative for pain, discharge and visual disturbance.  Respiratory: Negative for cough, chest tightness, wheezing and stridor.   Cardiovascular: Negative for chest pain, palpitations and leg swelling.  Gastrointestinal: Negative for nausea, vomiting, abdominal pain, diarrhea, constipation, blood in stool and abdominal distention.  Genitourinary: Negative for urgency, hematuria, flank pain, discharge, difficulty urinating and genital sores.  Musculoskeletal: Negative for myalgias, back pain, joint swelling, arthralgias and gait problem.  Skin: Negative for rash.  Neurological: Positive for numbness. Negative for dizziness, syncope, speech difficulty, weakness and headaches.  Hematological: Negative for adenopathy. Does not bruise/bleed easily.  Psychiatric/Behavioral: Negative for behavioral problems and dysphoric mood. The patient is not nervous/anxious.        Objective:   Physical Exam  Constitutional: He is oriented to person, place, and time. He appears well-developed.  HENT:  Head: Normocephalic.  Right Ear: External ear normal.  Left Ear: External ear normal.  Eyes: Conjunctivae and EOM are normal.  Neck:  Normal range of motion.  Cardiovascular: Normal rate and normal heart sounds.   Pulmonary/Chest: Breath sounds normal.  Abdominal: Bowel sounds are normal.  Musculoskeletal: Normal range of motion. He exhibits no edema and no tenderness.       Left bunion Bilateral calluses present over the first metatarsal heads bilaterally  Neurological: He is alert and oriented to person, place, and time.       Decreased soft touch and vibratory sensation involving both feet  Psychiatric: He has a normal mood and affect. His behavior is normal.          Assessment & Plan:   Diabetes mellitus. Well controlled we'll check a hemoglobin A1c and continue present regimen Peripheral neuropathy. Forms completed for diabetic shoes Callus formation Left hallux valgus deformity   Recheck 3 months

## 2011-04-14 NOTE — Patient Instructions (Signed)
Please check your hemoglobin A1c every 3 months    It is important that you exercise regularly, at least 20 minutes 3 to 4 times per week.  If you develop chest pain or shortness of breath seek  medical attention.   

## 2011-04-18 ENCOUNTER — Telehealth: Payer: Self-pay | Admitting: Internal Medicine

## 2011-04-18 NOTE — Telephone Encounter (Signed)
Pt walk in requesting A1C results given 6.0. Pt will pick up dm med

## 2011-04-18 NOTE — Telephone Encounter (Signed)
Pt would like to know a1c results before getting a refill on dm med

## 2011-04-18 NOTE — Telephone Encounter (Signed)
noted 

## 2011-04-18 NOTE — Telephone Encounter (Signed)
Attempt to call- [facility] - LMT[facility] if questions - A1c WLN - contiune meds at current doses

## 2011-05-21 ENCOUNTER — Other Ambulatory Visit: Payer: Self-pay | Admitting: Internal Medicine

## 2011-05-27 ENCOUNTER — Ambulatory Visit: Payer: Medicare Other | Admitting: Internal Medicine

## 2011-07-14 ENCOUNTER — Ambulatory Visit (INDEPENDENT_AMBULATORY_CARE_PROVIDER_SITE_OTHER): Payer: Medicare Other | Admitting: Internal Medicine

## 2011-07-14 ENCOUNTER — Encounter: Payer: Self-pay | Admitting: Internal Medicine

## 2011-07-14 DIAGNOSIS — E119 Type 2 diabetes mellitus without complications: Secondary | ICD-10-CM

## 2011-07-14 DIAGNOSIS — R209 Unspecified disturbances of skin sensation: Secondary | ICD-10-CM | POA: Diagnosis not present

## 2011-07-14 DIAGNOSIS — M1990 Unspecified osteoarthritis, unspecified site: Secondary | ICD-10-CM | POA: Diagnosis not present

## 2011-07-14 MED ORDER — GLUCOSE BLOOD VI STRP: 1.0000 | ORAL_STRIP | Freq: Every day | Status: DC

## 2011-07-14 NOTE — Patient Instructions (Signed)
Limit your sodium (Salt) intake    It is important that you exercise regularly, at least 20 minutes 3 to 4 times per week.  If you develop chest pain or shortness of breath seek  medical attention.  Return in 6 months for follow-up  

## 2011-07-14 NOTE — Progress Notes (Signed)
  Subjective:    Patient ID: Tyler Greer, male    DOB: November 15, 1935, 76 y.o.   MRN: [id number]  HPI  76 year old patient who is seen today for followup of type 2 diabetes. He is on metformin therapy alone with very nice glycemic control. Last hemoglobin A1c 6.0. He does have a peripheral neuropathy. No concerns or complaints he does have osteoarthritis but remains reactive with a regular and fairly vigorous exercise regimen.    Review of Systems  Constitutional: Negative for fever, chills, appetite change and fatigue.  HENT: Negative for hearing loss, ear pain, congestion, sore throat, trouble swallowing, neck stiffness, dental problem, voice change and tinnitus.   Eyes: Negative for pain, discharge and visual disturbance.  Respiratory: Negative for cough, chest tightness, wheezing and stridor.   Cardiovascular: Negative for chest pain, palpitations and leg swelling.  Gastrointestinal: Negative for nausea, vomiting, abdominal pain, diarrhea, constipation, blood in stool and abdominal distention.  Genitourinary: Negative for urgency, hematuria, flank pain, discharge, difficulty urinating and genital sores.  Musculoskeletal: Negative for myalgias, back pain, joint swelling, arthralgias and gait problem.  Skin: Negative for rash.  Neurological: Negative for dizziness, syncope, speech difficulty, weakness, numbness and headaches.  Hematological: Negative for adenopathy. Does not bruise/bleed easily.  Psychiatric/Behavioral: Negative for behavioral problems and dysphoric mood. The patient is not nervous/anxious.        Objective:   Physical Exam  Constitutional: He is oriented to person, place, and time. He appears well-developed.  HENT:  Head: Normocephalic.  Right Ear: External ear normal.  Left Ear: External ear normal.  Eyes: Conjunctivae and EOM are normal.  Neck: Normal range of motion.  Cardiovascular: Normal rate and normal heart sounds.   Pulmonary/Chest: Breath sounds normal.   Abdominal: Bowel sounds are normal.  Musculoskeletal: Normal range of motion. He exhibits no edema and no tenderness.  Neurological: He is alert and oriented to person, place, and time.  Psychiatric: He has a normal mood and affect. His behavior is normal.          Assessment & Plan:    I diabetes mellitus well controlled. We'll check a hemoglobin A1c. If this still remains in a nondiabetic range we'll recheck in 6 months osteoarthritis stable Peripheral neuropathy

## 2011-08-24 ENCOUNTER — Other Ambulatory Visit: Payer: Self-pay | Admitting: Internal Medicine

## 2011-11-30 ENCOUNTER — Encounter: Payer: Self-pay | Admitting: Internal Medicine

## 2011-11-30 ENCOUNTER — Ambulatory Visit (INDEPENDENT_AMBULATORY_CARE_PROVIDER_SITE_OTHER): Payer: Medicare Other | Admitting: Internal Medicine

## 2011-11-30 VITALS — BP 120/70 | Temp 98.3°F | Wt 202.0 lb

## 2011-11-30 DIAGNOSIS — S90129A Contusion of unspecified lesser toe(s) without damage to nail, initial encounter: Secondary | ICD-10-CM

## 2011-11-30 DIAGNOSIS — E119 Type 2 diabetes mellitus without complications: Secondary | ICD-10-CM | POA: Diagnosis not present

## 2011-11-30 DIAGNOSIS — M1990 Unspecified osteoarthritis, unspecified site: Secondary | ICD-10-CM | POA: Diagnosis not present

## 2011-11-30 DIAGNOSIS — Z23 Encounter for immunization: Secondary | ICD-10-CM

## 2011-11-30 DIAGNOSIS — S90122A Contusion of left lesser toe(s) without damage to nail, initial encounter: Secondary | ICD-10-CM

## 2011-11-30 NOTE — Patient Instructions (Signed)
Limit your sodium (Salt) intake    It is important that you exercise regularly, at least 20 minutes 3 to 4 times per week.  If you develop chest pain or shortness of breath seek  medical attention.  Return in 6 months for follow-up  

## 2011-11-30 NOTE — Progress Notes (Signed)
Subjective:    Patient ID: Tyler Greer, male    DOB: 11-08-1935, 76 y.o.   MRN: [id number]  HPI  76 year old patient who has type 2 diabetes that has been tightly controlled on metformin therapy. Hemoglobin A1c is are generally less than 6. He does have a history of lower extremity paresthesias. Yesterday he traumatized his left third toe and was concerned about the complications. He does have a history of claudication but when he walks or even runs knee pain is his limiting factor  Past Medical History  Diagnosis Date  . Osteoarthritis   . Diabetes mellitus type II 10/2007  . Intention tremor   . Paresthesia of foot     bilateral    History   Social History  . Marital Status: Widowed    Spouse Name: N/A    Number of Children: N/A  . Years of Education: N/A   Occupational History  . Not on file.   Social History Main Topics  . Smoking status: Never Smoker   . Smokeless tobacco: Never Used  . Alcohol Use: No  . Drug Use: No  . Sexually Active: Not on file   Other Topics Concern  . Not on file   Social History Narrative   Designated third party release signed on 07/13/09    Past Surgical History  Procedure Date  . Tonsillectomy   . Lumbar laminectomy     times 2  . Knee arthroscopy 2002    bilateral  . Hammer toe surgery     Family History  Problem Relation Age of Onset  . Heart disease Father     died age 8-MI  . Diabetes type II Brother   . Melanoma Brother     No Known Allergies  Current Outpatient Prescriptions on File Prior to Visit  Medication Sig Dispense Refill  . ACCU-CHEK COMPACT TEST DRUM test strip CHECK BLOOD GLOCOSE (SUGAR) DAILY  102 each  3  . aspirin 81 MG tablet Take 81 mg by mouth daily.        . Lancets Misc. (ACCU-CHEK MULTICLIX LANCET DEV) KIT AS DIRECTED  100 each  3  . metFORMIN (GLUCOPHAGE-XR) 500 MG 24 hr tablet TAKE TWO TABLETS EVERY DAY  180 tablet  3    BP 120/70  Temp 98.3 F (36.8 C) (Oral)  Wt 202 lb (91.627  kg)      Review of Systems  Constitutional: Negative for fever, chills, appetite change and fatigue.  HENT: Negative for hearing loss, ear pain, congestion, sore throat, trouble swallowing, neck stiffness, dental problem, voice change and tinnitus.   Eyes: Negative for pain, discharge and visual disturbance.  Respiratory: Negative for cough, chest tightness, wheezing and stridor.   Cardiovascular: Negative for chest pain, palpitations and leg swelling.  Gastrointestinal: Negative for nausea, vomiting, abdominal pain, diarrhea, constipation, blood in stool and abdominal distention.  Genitourinary: Negative for urgency, hematuria, flank pain, discharge, difficulty urinating and genital sores.  Musculoskeletal: Positive for gait problem. Negative for myalgias, back pain, joint swelling and arthralgias.       Ecchymoses left third distal toe. No subungual hematoma  Skin: Negative for rash.  Neurological: Negative for dizziness, syncope, speech difficulty, weakness, numbness and headaches.  Hematological: Negative for adenopathy. Does not bruise/bleed easily.  Psychiatric/Behavioral: Negative for behavioral problems and dysphoric mood. The patient is not nervous/anxious.        Objective:   Physical Exam  Constitutional: He is oriented to person, place, and time. He appears well-developed.  HENT:  Head: Normocephalic.  Right Ear: External ear normal.  Left Ear: External ear normal.  Eyes: Conjunctivae normal and EOM are normal.  Neck: Normal range of motion.  Cardiovascular: Normal rate, normal heart sounds and intact distal pulses.        Pedal pulses are full  Pulmonary/Chest: Breath sounds normal.  Abdominal: Bowel sounds are normal.  Musculoskeletal: Normal range of motion. He exhibits no edema and no tenderness.       Ecchymoses left distal third toe. No misalignment  Neurological: He is alert and oriented to person, place, and time.  Psychiatric: He has a normal mood and  affect. His behavior is normal.          Assessment & Plan:    diabetes mellitus excellent control  contusion ecchymosis left third toe  Osteoarthritis stable  A hemoglobin A1c will be checked Flu vaccine administered Recheck 6 months/

## 2012-01-16 ENCOUNTER — Encounter: Payer: Medicare Other | Admitting: Internal Medicine

## 2012-05-29 ENCOUNTER — Encounter: Payer: Self-pay | Admitting: Internal Medicine

## 2012-05-29 ENCOUNTER — Ambulatory Visit (INDEPENDENT_AMBULATORY_CARE_PROVIDER_SITE_OTHER): Payer: Medicare Other | Admitting: Internal Medicine

## 2012-05-29 VITALS — BP 132/80 | HR 76 | Temp 97.6°F | Resp 18 | Ht 70.5 in | Wt 210.0 lb

## 2012-05-29 DIAGNOSIS — R209 Unspecified disturbances of skin sensation: Secondary | ICD-10-CM | POA: Diagnosis not present

## 2012-05-29 DIAGNOSIS — E785 Hyperlipidemia, unspecified: Secondary | ICD-10-CM

## 2012-05-29 DIAGNOSIS — M1990 Unspecified osteoarthritis, unspecified site: Secondary | ICD-10-CM | POA: Diagnosis not present

## 2012-05-29 DIAGNOSIS — E349 Endocrine disorder, unspecified: Secondary | ICD-10-CM

## 2012-05-29 DIAGNOSIS — Z0000 Encounter for general adult medical examination without abnormal findings: Secondary | ICD-10-CM

## 2012-05-29 DIAGNOSIS — E291 Testicular hypofunction: Secondary | ICD-10-CM

## 2012-05-29 DIAGNOSIS — E119 Type 2 diabetes mellitus without complications: Secondary | ICD-10-CM | POA: Diagnosis not present

## 2012-05-29 LAB — LIPID PANEL
Cholesterol: 218 mg/dL — ABNORMAL HIGH (ref 0–200)
Total CHOL/HDL Ratio: 6
VLDL: 59.6 mg/dL — ABNORMAL HIGH (ref 0.0–40.0)

## 2012-05-29 LAB — COMPREHENSIVE METABOLIC PANEL
ALT: 15 U/L (ref 0–53)
AST: 19 U/L (ref 0–37)
Chloride: 103 mEq/L (ref 96–112)
Creatinine, Ser: 1 mg/dL (ref 0.4–1.5)
Sodium: 139 mEq/L (ref 135–145)
Total Bilirubin: 0.6 mg/dL (ref 0.3–1.2)

## 2012-05-29 LAB — CBC WITH DIFFERENTIAL/PLATELET
Basophils Relative: 0.2 % (ref 0.0–3.0)
Eosinophils Relative: 1 % (ref 0.0–5.0)
HCT: 42.9 % (ref 39.0–52.0)
Hemoglobin: 14.4 g/dL (ref 13.0–17.0)
Lymphs Abs: 1 10*3/uL (ref 0.7–4.0)
Monocytes Relative: 9.2 % (ref 3.0–12.0)
Neutro Abs: 5.4 10*3/uL (ref 1.4–7.7)
WBC: 7.1 10*3/uL (ref 4.5–10.5)

## 2012-05-29 LAB — MICROALBUMIN / CREATININE URINE RATIO: Microalb Creat Ratio: 0.4 mg/g (ref 0.0–30.0)

## 2012-05-29 MED ORDER — METFORMIN HCL ER 500 MG PO TB24: ORAL_TABLET | ORAL | Status: DC

## 2012-05-29 NOTE — Progress Notes (Signed)
Subjective:    Patient ID: Tyler Greer, male    DOB: [birth year], 77 y.o.   MRN: [id number]  HPI   Wt Readings from Last 3 Encounters:  05/29/12 210 lb (95.255 kg)  11/30/11 202 lb (91.627 kg)  07/14/11 247 lb (112.038 kg)    Subjective:    Patient ID: Tyler Greer, male    DOB: 1936/02/19, 77 y.o.   MRN: [id number]  HPI  History of Present Illness:   77  year-old patient who is seen today for a comprehensive examination. He has an approximate  3 year history diabetes mellitus. He presented with a random blood sugar is 719 about  5 years  ago and has been under excellent control. Since that time. Blood sugars are occasionally less than 70. Marland Kitchen He is also on oral medications. He has a history of lower extremity paresthesias and also an intention tremor. He has osteoarthritis. He has a history of mild testosterone deficiency  1. Risk factors, based on past  M,S,F history-  cardiovascular risk factors include a history of type 2 diabetes  2.  Physical activities: Remains quite active still works part-time walks daily in the mornings  3.  Depression/mood: No history depression or mood disorder 4.  Hearing:  No deficits  5.  ADL's: Independent in all aspects of daily living  6.  Fall risk: Low  7.  Home safety: No problems identified  8.  Height weight, and visual acuity; height and weight stable;  is scheduled to see a retinal specialist next month  9.  Counseling: Followup eye exam. He will consider a followup screening colonoscopy  10. Lab orders based on risk factors: Laboratory panel including hemoglobin A1c will be reviewed  11. Referral: Referral  12. Care plan: Continue a regular exercise program proper diet  13. Cognitive assessment: Alert and oriented normal affect. No cognitive dysfunction.      Preventive Screening-Counseling & Management  Alcohol-Tobacco  Smoking Status: never  Caffeine-Diet-Exercise  Does Patient Exercise: yes   Allergies:  No  Known Drug Allergies   Past History:  Past Medical History:  Reviewed history from 02/25/2008 and no changes required.  Osteoarthritis  Diabetes mellitus, type II onset September 2009  intention tremor  paresthesias of the feet   Past Surgical History:  Tonsillectomy  Lumbar laminectomyx 2  bilateral arthroscopic knee surgery 2002  colonoscopy approximately 2000  foot surgery for hammertoe deformities   Family History:  Reviewed history from 10/23/2007 and no changes required.  mother died at 48- history of impaired glucose tolerance  father died age 2, MI  two brothers two sisters  one brother deceased of a melanoma; history of diabetes, type II   Social History:   Widower Never Smoked  Regular exercise-yes  Smoking Status: never  Does Patient Exercise: yes      Review of Systems  Constitutional: Negative for fever, chills, activity change, appetite change and fatigue.  HENT: Negative for hearing loss, ear pain, congestion, rhinorrhea, sneezing, mouth sores, trouble swallowing, neck pain, neck stiffness, dental problem, voice change, sinus pressure and tinnitus.   Eyes: Negative for photophobia, pain, redness and visual disturbance.  Respiratory: Negative for apnea, cough, choking, chest tightness, shortness of breath and wheezing.   Cardiovascular: Negative for chest pain, palpitations and leg swelling.  Gastrointestinal: Negative for nausea, vomiting, abdominal pain, diarrhea, constipation, blood in stool, abdominal distention, anal bleeding and rectal pain.  Genitourinary: Negative for dysuria, urgency, frequency, hematuria, flank pain, decreased urine volume, discharge,  penile swelling, scrotal swelling, difficulty urinating, genital sores and testicular pain.  Musculoskeletal: Negative for myalgias, back pain, joint swelling, arthralgias and gait problem.  Skin: Negative for color change, rash and wound.  Neurological: Negative for dizziness, tremors, seizures,  syncope, facial asymmetry, speech difficulty, weakness, light-headedness, numbness and headaches.  Hematological: Negative for adenopathy. Does not bruise/bleed easily.  Psychiatric/Behavioral: Negative for suicidal ideas, hallucinations, behavioral problems, confusion, sleep disturbance, self-injury, dysphoric mood, decreased concentration and agitation. The patient is not nervous/anxious.        Objective:   Physical Exam  Constitutional: He appears well-developed and well-nourished.  HENT:  Head: Normocephalic and atraumatic.  Right Ear: External ear normal.  Left Ear: External ear normal.  Nose: Nose normal.  Mouth/Throat: Oropharynx is clear and moist.  Eyes: Conjunctivae and EOM are normal. Pupils are equal, round, and reactive to light. No scleral icterus.  Neck: Normal range of motion. Neck supple. No JVD present. No thyromegaly present.  Cardiovascular: Regular rhythm, normal heart sounds and intact distal pulses.  Exam reveals no gallop and no friction rub.   No murmur heard. Pulmonary/Chest: Effort normal and breath sounds normal. He exhibits no tenderness.  Abdominal: Soft. Bowel sounds are normal. He exhibits no distension and no mass. There is no tenderness.  Genitourinary: Prostate normal and penis normal.  Musculoskeletal: Normal range of motion. He exhibits no edema and no tenderness.  Lymphadenopathy:    He has no cervical adenopathy.  Neurological: He is alert. He has normal reflexes. No cranial nerve deficit. Coordination normal.       Mild decreased vibratory sensation of his feet   Skin: Skin is warm and dry. No rash noted.  Psychiatric: He has a normal mood and affect. His behavior is normal.          Assessment & Plan:    Preventive health examination Type 2 diabetes  We'll continue active lifestyle. His weight is ideal at present continue proper diet. Hemoglobin A1c will be checked   He will consider a followup colonoscopy. He is scheduled for  followup eye exam  .Review of Systems     Objective:   Physical Exam        Assessment & Plan:

## 2012-05-29 NOTE — Patient Instructions (Signed)
Limit your sodium (Salt) intake    It is important that you exercise regularly, at least 20 minutes 3 to 4 times per week.  If you develop chest pain or shortness of breath seek  medical attention.  You need to lose weight.  Consider a lower calorie diet and regular exercise. 

## 2012-05-30 ENCOUNTER — Encounter: Payer: Medicare Other | Admitting: Internal Medicine

## 2012-07-11 ENCOUNTER — Encounter: Payer: Self-pay | Admitting: Family Medicine

## 2012-07-11 ENCOUNTER — Ambulatory Visit (INDEPENDENT_AMBULATORY_CARE_PROVIDER_SITE_OTHER): Payer: Medicare Other | Admitting: Family Medicine

## 2012-07-11 VITALS — BP 120/72 | HR 66 | Temp 98.2°F | Resp 18

## 2012-07-11 DIAGNOSIS — R110 Nausea: Secondary | ICD-10-CM

## 2012-07-11 DIAGNOSIS — R42 Dizziness and giddiness: Secondary | ICD-10-CM

## 2012-07-11 DIAGNOSIS — H8110 Benign paroxysmal vertigo, unspecified ear: Secondary | ICD-10-CM

## 2012-07-11 DIAGNOSIS — E119 Type 2 diabetes mellitus without complications: Secondary | ICD-10-CM | POA: Diagnosis not present

## 2012-07-11 NOTE — Progress Notes (Signed)
  Subjective:    Patient ID: Tyler Greer, male    DOB: Jun 11, 1935, 77 y.o.   MRN: [id number]  HPI Here as a walk in for feeling bad for the past few hours. He was painting a door and started to feel lightheaded and dizzy. This has gotten worse to the point that he now feels like the room is spinning around him whenever he moves quickly. When he sits still he feels better. He is very nauseated but has not vomited. No chest pain or palpitations or SOB. He had complete labs here last month and these were within normal limits. He denies any neurologic deficits. He has had "inner ear trouble" with similar symptoms in the past.    Review of Systems  Constitutional: Negative.   HENT: Negative.   Respiratory: Negative.   Cardiovascular: Negative.   Gastrointestinal: Positive for nausea. Negative for vomiting, abdominal pain, diarrhea, constipation, blood in stool and abdominal distention.  Neurological: Positive for dizziness and light-headedness. Negative for tremors, seizures, syncope, facial asymmetry, speech difficulty, weakness, numbness and headaches.       Objective:   Physical Exam  Constitutional: He is oriented to person, place, and time.  He appears ill and wants to sit still. No photophobia   HENT:  Head: Normocephalic and atraumatic.  Right Ear: External ear normal.  Left Ear: External ear normal.  Eyes: Conjunctivae and EOM are normal. Pupils are equal, round, and reactive to light.  Neck: Normal range of motion. Neck supple. No thyromegaly present.  Cardiovascular: Normal rate, regular rhythm, normal heart sounds and intact distal pulses.   EKG shows no change from one month ago with sinus rhythm , RBBB, and bifascicular block   Pulmonary/Chest: Effort normal and breath sounds normal. No respiratory distress. He has no wheezes. He has no rales. He exhibits no tenderness.  Lymphadenopathy:    He has no cervical adenopathy.  Neurological: He is alert and oriented to person,  place, and time. He has normal reflexes. He displays normal reflexes. No cranial nerve deficit. He exhibits normal muscle tone.  A little unsteady on his feet but walks without assistance          Assessment & Plan:  He is having vertigo from vestibular dysfunction. I offered to get him some meclizine and phenergan, but he declined. He has some Dramamine at home and he wants to just go home and use this. I advised him to rest and drink plenty of fluids. Recheck prn

## 2012-11-28 ENCOUNTER — Ambulatory Visit (INDEPENDENT_AMBULATORY_CARE_PROVIDER_SITE_OTHER): Payer: Medicare Other | Admitting: Internal Medicine

## 2012-11-28 ENCOUNTER — Encounter: Payer: Self-pay | Admitting: Internal Medicine

## 2012-11-28 VITALS — BP 120/72 | HR 84 | Temp 97.7°F | Resp 20 | Wt 205.0 lb

## 2012-11-28 DIAGNOSIS — E119 Type 2 diabetes mellitus without complications: Secondary | ICD-10-CM | POA: Diagnosis not present

## 2012-11-28 DIAGNOSIS — M1990 Unspecified osteoarthritis, unspecified site: Secondary | ICD-10-CM | POA: Diagnosis not present

## 2012-11-28 DIAGNOSIS — Z23 Encounter for immunization: Secondary | ICD-10-CM | POA: Diagnosis not present

## 2012-11-28 DIAGNOSIS — G250 Essential tremor: Secondary | ICD-10-CM | POA: Diagnosis not present

## 2012-11-28 DIAGNOSIS — E785 Hyperlipidemia, unspecified: Secondary | ICD-10-CM | POA: Diagnosis not present

## 2012-11-28 LAB — HEMOGLOBIN A1C: Hgb A1c MFr Bld: 6.1 % (ref 4.6–6.5)

## 2012-11-28 MED ORDER — ASPIRIN 81 MG PO TABS: 81.0000 mg | ORAL_TABLET | Freq: Every day | ORAL | Status: DC

## 2012-11-28 NOTE — Progress Notes (Signed)
Subjective:    Patient ID: Tyler Greer, male    DOB: Jun 14, 1935, 77 y.o.   MRN: [id number]  HPI  77 year old patient who has type 2 diabetes which has been well controlled with low-dose metformin therapy. Hemoglobin A1c's have basically been in a nondiabetic range. He feels quite well CHD risk calculated at 16% and statin therapy discussed. He wishes to avoid Denies any cardiopulmonary complaints.  Past Medical History  Diagnosis Date  . Osteoarthritis   . Diabetes mellitus type II 10/2007  . Intention tremor   . Paresthesia of foot     bilateral    History   Social History  . Marital Status: Widowed    Spouse Name: N/A    Number of Children: N/A  . Years of Education: N/A   Occupational History  . Not on file.   Social History Main Topics  . Smoking status: Never Smoker   . Smokeless tobacco: Never Used  . Alcohol Use: No  . Drug Use: No  . Sexual Activity: Not on file   Other Topics Concern  . Not on file   Social History Narrative   Designated third party release signed on 07/13/09    Past Surgical History  Procedure Laterality Date  . Tonsillectomy    . Lumbar laminectomy      times 2  . Knee arthroscopy  2002    bilateral  . Hammer toe surgery      Family History  Problem Relation Age of Onset  . Heart disease Father     died age 57-MI  . Diabetes type II Brother   . Melanoma Brother     No Known Allergies  Current Outpatient Prescriptions on File Prior to Visit  Medication Sig Dispense Refill  . ACCU-CHEK COMPACT TEST DRUM test strip CHECK BLOOD GLOCOSE (SUGAR) DAILY  102 each  3  . aspirin 325 MG tablet Take 325 mg by mouth daily.      . Lancets Misc. (ACCU-CHEK MULTICLIX LANCET DEV) KIT AS DIRECTED  100 each  3  . metFORMIN (GLUCOPHAGE-XR) 500 MG 24 hr tablet TAKE TWO TABLETS EVERY DAY  180 tablet  6   No current facility-administered medications on file prior to visit.    BP 120/72  Pulse 84  Temp(Src) 97.7 F (36.5 C) (Oral)   Resp 20  Wt 205 lb (92.987 kg)  BMI 28.99 kg/m2  SpO2 98%       Review of Systems  Constitutional: Negative for fever, chills, appetite change and fatigue.  HENT: Negative for congestion, dental problem, ear pain, hearing loss, sore throat, tinnitus, trouble swallowing and voice change.   Eyes: Negative for pain, discharge and visual disturbance.  Respiratory: Negative for cough, chest tightness, wheezing and stridor.   Cardiovascular: Negative for chest pain, palpitations and leg swelling.  Gastrointestinal: Negative for nausea, vomiting, abdominal pain, diarrhea, constipation, blood in stool and abdominal distention.  Genitourinary: Negative for urgency, hematuria, flank pain, discharge, difficulty urinating and genital sores.  Musculoskeletal: Negative for arthralgias, back pain, gait problem, joint swelling, myalgias and neck stiffness.  Skin: Negative for rash.  Neurological: Negative for dizziness, syncope, speech difficulty, weakness, numbness and headaches.  Hematological: Negative for adenopathy. Does not bruise/bleed easily.  Psychiatric/Behavioral: Negative for behavioral problems and dysphoric mood. The patient is not nervous/anxious.        Objective:   Physical Exam  Constitutional: He is oriented to person, place, and time. He appears well-developed.  HENT:  Head: Normocephalic.  Right  Ear: External ear normal.  Left Ear: External ear normal.  Eyes: Conjunctivae and EOM are normal.  Neck: Normal range of motion.  Cardiovascular: Normal rate and normal heart sounds.   Pulmonary/Chest: Breath sounds normal.  Abdominal: Bowel sounds are normal.  Musculoskeletal: Normal range of motion. He exhibits no edema and no tenderness.  Neurological: He is alert and oriented to person, place, and time.  Psychiatric: He has a normal mood and affect. His behavior is normal.          Assessment & Plan:   Diabetes mellitus Dyslipidemia  Statin therapy discussed. He is  aware of the benefits. He wishes to defer at this time CPX 6 months

## 2012-11-28 NOTE — Patient Instructions (Signed)
Limit your sodium (Salt) intake    It is important that you exercise regularly, at least 20 minutes 3 to 4 times per week.  If you develop chest pain or shortness of breath seek  medical attention.  Return in 6 months for follow-up  

## 2013-04-18 DIAGNOSIS — C44319 Basal cell carcinoma of skin of other parts of face: Secondary | ICD-10-CM | POA: Diagnosis not present

## 2013-04-18 DIAGNOSIS — C44320 Squamous cell carcinoma of skin of unspecified parts of face: Secondary | ICD-10-CM | POA: Diagnosis not present

## 2013-04-18 DIAGNOSIS — D0420 Carcinoma in situ of skin of unspecified ear and external auricular canal: Secondary | ICD-10-CM | POA: Diagnosis not present

## 2013-04-18 DIAGNOSIS — D492 Neoplasm of unspecified behavior of bone, soft tissue, and skin: Secondary | ICD-10-CM | POA: Diagnosis not present

## 2013-05-30 ENCOUNTER — Encounter: Payer: Medicare Other | Admitting: Internal Medicine

## 2013-06-01 ENCOUNTER — Other Ambulatory Visit: Payer: Self-pay | Admitting: Internal Medicine

## 2013-08-01 DIAGNOSIS — C44319 Basal cell carcinoma of skin of other parts of face: Secondary | ICD-10-CM | POA: Diagnosis not present

## 2013-08-28 ENCOUNTER — Ambulatory Visit (INDEPENDENT_AMBULATORY_CARE_PROVIDER_SITE_OTHER): Payer: Medicare Other | Admitting: Internal Medicine

## 2013-08-28 ENCOUNTER — Encounter: Payer: Self-pay | Admitting: Internal Medicine

## 2013-08-28 VITALS — BP 130/80 | HR 62 | Temp 98.1°F | Resp 20 | Ht 71.0 in | Wt 198.0 lb

## 2013-08-28 DIAGNOSIS — E119 Type 2 diabetes mellitus without complications: Secondary | ICD-10-CM

## 2013-08-28 DIAGNOSIS — E1349 Other specified diabetes mellitus with other diabetic neurological complication: Secondary | ICD-10-CM

## 2013-08-28 DIAGNOSIS — G250 Essential tremor: Secondary | ICD-10-CM | POA: Diagnosis not present

## 2013-08-28 DIAGNOSIS — E1142 Type 2 diabetes mellitus with diabetic polyneuropathy: Secondary | ICD-10-CM

## 2013-08-28 DIAGNOSIS — M1990 Unspecified osteoarthritis, unspecified site: Secondary | ICD-10-CM | POA: Diagnosis not present

## 2013-08-28 DIAGNOSIS — Z0000 Encounter for general adult medical examination without abnormal findings: Secondary | ICD-10-CM | POA: Diagnosis not present

## 2013-08-28 DIAGNOSIS — E1140 Type 2 diabetes mellitus with diabetic neuropathy, unspecified: Secondary | ICD-10-CM | POA: Insufficient documentation

## 2013-08-28 DIAGNOSIS — R209 Unspecified disturbances of skin sensation: Secondary | ICD-10-CM

## 2013-08-28 DIAGNOSIS — E0842 Diabetes mellitus due to underlying condition with diabetic polyneuropathy: Secondary | ICD-10-CM

## 2013-08-28 DIAGNOSIS — G252 Other specified forms of tremor: Secondary | ICD-10-CM

## 2013-08-28 DIAGNOSIS — E785 Hyperlipidemia, unspecified: Secondary | ICD-10-CM

## 2013-08-28 LAB — LIPID PANEL
CHOL/HDL RATIO: 6
CHOLESTEROL: 247 mg/dL — AB (ref 0–200)
HDL: 39 mg/dL — AB (ref >39.00)
LDL CALC: 147 mg/dL — AB (ref 0–99)
NonHDL: 208
TRIGLYCERIDES: 303 mg/dL — AB (ref 0.0–149.0)
VLDL: 60.6 mg/dL — ABNORMAL HIGH (ref 0.0–40.0)

## 2013-08-28 LAB — COMPREHENSIVE METABOLIC PANEL
ALBUMIN: 3.4 g/dL — AB (ref 3.5–5.2)
ALK PHOS: 65 U/L (ref 39–117)
ALT: 11 U/L (ref 0–53)
AST: 17 U/L (ref 0–37)
BUN: 25 mg/dL — AB (ref 6–23)
CALCIUM: 9.1 mg/dL (ref 8.4–10.5)
CO2: 27 mEq/L (ref 19–32)
CREATININE: 1 mg/dL (ref 0.4–1.5)
Chloride: 107 mEq/L (ref 96–112)
GFR: 75.54 mL/min (ref >60.00)
GLUCOSE: 103 mg/dL — AB (ref 70–99)
POTASSIUM: 4 meq/L (ref 3.5–5.1)
Sodium: 138 mEq/L (ref 135–145)
Total Bilirubin: 0.4 mg/dL (ref 0.2–1.2)
Total Protein: 6.9 g/dL (ref 6.0–8.3)

## 2013-08-28 LAB — MICROALBUMIN / CREATININE URINE RATIO
CREATININE, U: 194.4 mg/dL
Microalb Creat Ratio: 0.1 mg/g (ref 0.0–30.0)
Microalb, Ur: 0.2 mg/dL (ref 0.0–1.9)

## 2013-08-28 LAB — CBC WITH DIFFERENTIAL/PLATELET
BASOS PCT: 0.4 % (ref 0.0–3.0)
Basophils Absolute: 0 10*3/uL (ref 0.0–0.1)
EOS PCT: 1.7 % (ref 0.0–5.0)
Eosinophils Absolute: 0.1 10*3/uL (ref 0.0–0.7)
HCT: 40.1 % (ref 39.0–52.0)
HEMOGLOBIN: 13.6 g/dL (ref 13.0–17.0)
LYMPHS PCT: 22.8 % (ref 12.0–46.0)
Lymphs Abs: 1 10*3/uL (ref 0.7–4.0)
MCHC: 33.9 g/dL (ref 30.0–36.0)
MCV: 90 fl (ref 78.0–100.0)
Monocytes Absolute: 0.4 10*3/uL (ref 0.1–1.0)
Monocytes Relative: 9.4 % (ref 3.0–12.0)
NEUTROS ABS: 3 10*3/uL (ref 1.4–7.7)
NEUTROS PCT: 65.7 % (ref 43.0–77.0)
Platelets: 201 10*3/uL (ref 150.0–400.0)
RBC: 4.46 Mil/uL (ref 4.22–5.81)
RDW: 14.6 % (ref 11.5–15.5)
WBC: 4.6 10*3/uL (ref 4.0–10.5)

## 2013-08-28 LAB — TSH: TSH: 2.61 u[IU]/mL (ref 0.35–4.50)

## 2013-08-28 MED ORDER — METFORMIN HCL ER 500 MG PO TB24: ORAL_TABLET | ORAL | Status: DC

## 2013-08-28 NOTE — Progress Notes (Signed)
Subjective:    Patient ID: Tyler Greer, male    DOB: 20-Dec-1935, 78 y.o.   MRN: [id number]  Diabetes     Wt Readings from Last 3 Encounters:  08/28/13 198 lb (89.812 kg)  11/28/12 205 lb (92.987 kg)  05/29/12 210 lb (95.255 kg)    Subjective:    Patient ID: Tyler Greer, male    DOB: 01-18-1936, 78 y.o.   MRN: [id number]  HPI  History of Present Illness:   78   year-old patient who is seen today for a comprehensive examination. He has an approximate  5 year history diabetes mellitus. He presented with a random blood sugar of  719 about  5 years  ago and has been under excellent control. Since that time. Blood sugars are occasionally less than 70. Marland Kitchen He is also on oral medications. He has a history of lower extremity paresthesias and also an intention tremor. He has osteoarthritis. He has a history of mild testosterone deficiency  His last colonoscopy was in 2000.  He does not wish to have any further colonoscopies.  He has had an eye exam within the past year  1. Risk factors, based on past  M,S,F history-  cardiovascular risk factors include a history of type 2 diabetes  2.  Physical activities: Remains quite active still works part-time walks daily in the mornings  3.  Depression/mood: No history depression or mood disorder 4.  Hearing:  No deficits  5.  ADL's: Independent in all aspects of daily living  6.  Fall risk: Low  7.  Home safety: No problems identified  8.  Height weight, and visual acuity; height and weight stable;  is scheduled to see a retinal specialist next month  9.  Counseling: Followup eye exam. He will consider a followup screening colonoscopy  10. Lab orders based on risk factors: Laboratory panel including hemoglobin A1c will be reviewed  11. Referral: Not appropriate at this time  12. Care plan: Continue a regular exercise program proper diet  13. Cognitive assessment: Alert and oriented normal affect. No cognitive dysfunction.       Preventive Screening-Counseling & Management  Alcohol-Tobacco  Smoking Status: never  Caffeine-Diet-Exercise  Does Patient Exercise: yes   Allergies:  No Known Drug Allergies   Past History:  Past Medical History:   Osteoarthritis  Diabetes mellitus, type II onset September 2009  intention tremor  paresthesias of the feet   Past Surgical History:  Tonsillectomy  Lumbar laminectomyx 2  bilateral arthroscopic knee surgery 2002  colonoscopy approximately 2000  foot surgery for hammertoe deformities   Family History:   mother died at 36- history of impaired glucose tolerance  father died age 45, MI  two brothers two sisters  one brother deceased of a melanoma; history of diabetes, type II   Social History:   Widower Never Smoked  Regular exercise-yes  Smoking Status: never  Does Patient Exercise: yes      Review of Systems  Constitutional: Negative for fever, chills, activity change, appetite change and fatigue.  HENT: Negative for hearing loss, ear pain, congestion, rhinorrhea, sneezing, mouth sores, trouble swallowing, neck pain, neck stiffness, dental problem, voice change, sinus pressure and tinnitus.   Eyes: Negative for photophobia, pain, redness and visual disturbance.  Respiratory: Negative for apnea, cough, choking, chest tightness, shortness of breath and wheezing.   Cardiovascular: Negative for chest pain, palpitations and leg swelling.  Gastrointestinal: Negative for nausea, vomiting, abdominal pain, diarrhea, constipation, blood in stool, abdominal  distention, anal bleeding and rectal pain.  Genitourinary: Negative for dysuria, urgency, frequency, hematuria, flank pain, decreased urine volume, discharge, penile swelling, scrotal swelling, difficulty urinating, genital sores and testicular pain.  Musculoskeletal: Negative for myalgias, back pain, joint swelling, arthralgias and gait problem.  Skin: Negative for color change, rash and wound.   Neurological: Negative for dizziness, tremors, seizures, syncope, facial asymmetry, speech difficulty, weakness, light-headedness, numbness and headaches.  Hematological: Negative for adenopathy. Does not bruise/bleed easily.  Psychiatric/Behavioral: Negative for suicidal ideas, hallucinations, behavioral problems, confusion, sleep disturbance, self-injury, dysphoric mood, decreased concentration and agitation. The patient is not nervous/anxious.        Objective:   Physical Exam  Constitutional: He appears well-developed and well-nourished.  HENT:  Head: Normocephalic and atraumatic.  Right Ear: External ear normal.  Left Ear: External ear normal.  Nose: Nose normal.  Mouth/Throat: Oropharynx is clear and moist.  Eyes: Conjunctivae and EOM are normal. Pupils are equal, round, and reactive to light. No scleral icterus.  Neck: Normal range of motion. Neck supple. No JVD present. No thyromegaly present.  Cardiovascular: Regular rhythm, normal heart sounds and intact distal pulses.  Exam reveals no gallop and no friction rub.   No murmur heard. Pulmonary/Chest: Effort normal and breath sounds normal. He exhibits no tenderness.  Abdominal: Soft. Bowel sounds are normal. He exhibits no distension and no mass. There is no tenderness.  Genitourinary: Prostate normal and penis normal.  Musculoskeletal: Normal range of motion. He exhibits no edema and no tenderness.  Lymphadenopathy:    He has no cervical adenopathy.  Neurological: He is alert. He has normal reflexes. No cranial nerve deficit. Coordination normal.       Mild decreased vibratory sensation of his feet   Skin: Skin is warm and dry. No rash noted.  Psychiatric: He has a normal mood and affect. His behavior is normal.          Assessment & Plan:    Preventive health examination Type 2 diabetes.  We'll check a hemoglobin A1c.  Statin therapy discussed.  He wishes to defer at bedtime.  He is aware that there are treatment  options for tremor and diabetic peripheral neuropathy.  If these symptoms worsen  We'll continue active lifestyle. His weight is ideal at present continue proper diet. Hemoglobin A1c will be checked   He will consider a followup colonoscopy. He is scheduled for followup eye exam  .Review of Systems     as above Objective:   Physical Exam  As above        Assessment & Plan:   As above

## 2013-08-28 NOTE — Patient Instructions (Signed)
Limit your sodium (Salt) intake    It is important that you exercise regularly, at least 20 minutes 3 to 4 times per week.  If you develop chest pain or shortness of breath seek  medical attention.  Return in one year for follow-up   

## 2013-08-28 NOTE — Progress Notes (Signed)
Pre visit review using our clinic review tool, if applicable. No additional management support is needed unless otherwise documented below in the visit note. 

## 2013-09-02 ENCOUNTER — Telehealth: Payer: Self-pay | Admitting: Internal Medicine

## 2013-09-02 DIAGNOSIS — E119 Type 2 diabetes mellitus without complications: Secondary | ICD-10-CM

## 2013-09-02 NOTE — Telephone Encounter (Signed)
Pt would like blood work results °

## 2013-09-03 NOTE — Telephone Encounter (Signed)
Pt is calling back requesting his blood work results

## 2013-09-03 NOTE — Telephone Encounter (Signed)
Please call/notify patient that lab/test/procedure is normal except cholesterol elevated at 247

## 2013-09-04 NOTE — Telephone Encounter (Signed)
Pt called back told him lab results were normal except cholesterol was elevated at 247. Pt verbalized understanding and asked what his A1c was? Told pt that was not done it was not ordered, but I will put order in and he can call back and schedule lab appointment this week. Pt verbalized understanding.

## 2013-11-06 DIAGNOSIS — Z85828 Personal history of other malignant neoplasm of skin: Secondary | ICD-10-CM | POA: Diagnosis not present

## 2013-11-06 DIAGNOSIS — L570 Actinic keratosis: Secondary | ICD-10-CM | POA: Diagnosis not present

## 2013-11-20 ENCOUNTER — Telehealth: Payer: Self-pay

## 2013-11-20 ENCOUNTER — Ambulatory Visit (INDEPENDENT_AMBULATORY_CARE_PROVIDER_SITE_OTHER): Payer: Medicare Other | Admitting: *Deleted

## 2013-11-20 DIAGNOSIS — Z23 Encounter for immunization: Secondary | ICD-10-CM | POA: Diagnosis not present

## 2013-11-20 MED ORDER — SILDENAFIL CITRATE 100 MG PO TABS: 50.0000 mg | ORAL_TABLET | ORAL | Status: DC | PRN

## 2013-11-20 NOTE — Telephone Encounter (Signed)
100 mg  1/2-1 tab as needed

## 2013-11-20 NOTE — Telephone Encounter (Signed)
Pt notified Rx sent to pharmacy

## 2013-11-20 NOTE — Telephone Encounter (Signed)
Pt came into the office for flu shot today and states he saw Dr. Raliegh Ip for an annual exam in July and was told he could have a prescription sent over for Viagra.  Pls send to [facility].

## 2013-11-20 NOTE — Telephone Encounter (Signed)
Please advise dosage of Viagra? 

## 2014-02-24 ENCOUNTER — Encounter: Payer: Self-pay | Admitting: Internal Medicine

## 2014-02-24 ENCOUNTER — Ambulatory Visit (INDEPENDENT_AMBULATORY_CARE_PROVIDER_SITE_OTHER): Payer: Medicare Other | Admitting: Internal Medicine

## 2014-02-24 DIAGNOSIS — Z23 Encounter for immunization: Secondary | ICD-10-CM | POA: Diagnosis not present

## 2014-02-24 DIAGNOSIS — M150 Primary generalized (osteo)arthritis: Secondary | ICD-10-CM

## 2014-02-24 DIAGNOSIS — E119 Type 2 diabetes mellitus without complications: Secondary | ICD-10-CM

## 2014-02-24 DIAGNOSIS — E785 Hyperlipidemia, unspecified: Secondary | ICD-10-CM | POA: Diagnosis not present

## 2014-02-24 DIAGNOSIS — M159 Polyosteoarthritis, unspecified: Secondary | ICD-10-CM

## 2014-02-24 DIAGNOSIS — E0842 Diabetes mellitus due to underlying condition with diabetic polyneuropathy: Secondary | ICD-10-CM | POA: Diagnosis not present

## 2014-02-24 LAB — HEMOGLOBIN A1C: Hgb A1c MFr Bld: 6 % (ref 4.6–6.5)

## 2014-02-24 NOTE — Patient Instructions (Signed)
It is important that you exercise regularly, at least 20 minutes 3 to 4 times per week.  If you develop chest pain or shortness of breath seek  medical attention.  Please see your eye doctor yearly to check for diabetic eye damage  Return in 6 months for follow-up

## 2014-02-24 NOTE — Progress Notes (Signed)
Subjective:    Patient ID: Tyler Greer, male    DOB: Oct 15, 1935, 79 y.o.   MRN: [id number]  HPI 79 year old patient who is seen today for follow-up of type 2 diabetes.  This has been well-controlled on metformin therapy alone.  He does have diabetic neuropathy.  He has mild osteoarthritis in general has done quite well. He does have a history of mild dyslipidemia.  Statin therapy and current guidelines discussed at length.  He does not wish to consider statin therapy at this time  Past Medical History  Diagnosis Date  . Osteoarthritis   . Diabetes mellitus type II 10/2007  . Intention tremor   . Paresthesia of foot     bilateral    History   Social History  . Marital Status: Widowed    Spouse Name: N/A    Number of Children: N/A  . Years of Education: N/A   Occupational History  . Not on file.   Social History Main Topics  . Smoking status: Never Smoker   . Smokeless tobacco: Never Used  . Alcohol Use: No  . Drug Use: No  . Sexual Activity: Not on file   Other Topics Concern  . Not on file   Social History Narrative   Designated third party release signed on 07/13/09    Past Surgical History  Procedure Laterality Date  . Tonsillectomy    . Lumbar laminectomy      times 2  . Knee arthroscopy  2002    bilateral  . Hammer toe surgery      Family History  Problem Relation Age of Onset  . Heart disease Father     died age 55-MI  . Diabetes type II Brother   . Melanoma Brother     No Known Allergies  Current Outpatient Prescriptions on File Prior to Visit  Medication Sig Dispense Refill  . ACCU-CHEK COMPACT TEST DRUM test strip CHECK BLOOD GLOCOSE (SUGAR) DAILY 102 each 3  . Lancets Misc. (ACCU-CHEK MULTICLIX LANCET DEV) KIT AS DIRECTED 100 each 3  . metFORMIN (GLUCOPHAGE-XR) 500 MG 24 hr tablet TAKE TWO TABLETS EVERY DAY 180 tablet 1  . sildenafil (VIAGRA) 100 MG tablet Take 0.5-1 tablets (50-100 mg total) by mouth as needed for erectile dysfunction.  5 tablet 2   No current facility-administered medications on file prior to visit.    BP 120/80 mmHg  Pulse 91  Temp(Src) 97.9 F (36.6 C) (Oral)  Resp 20  Ht 5' 11"  (1.803 m)  Wt 205 lb (92.987 kg)  BMI 28.60 kg/m2     Review of Systems  Constitutional: Negative for fever, chills, appetite change and fatigue.  HENT: Negative for congestion, dental problem, ear pain, hearing loss, sore throat, tinnitus, trouble swallowing and voice change.   Eyes: Negative for pain, discharge and visual disturbance.  Respiratory: Negative for cough, chest tightness, wheezing and stridor.   Cardiovascular: Negative for chest pain, palpitations and leg swelling.  Gastrointestinal: Negative for nausea, vomiting, abdominal pain, diarrhea, constipation, blood in stool and abdominal distention.  Genitourinary: Negative for urgency, hematuria, flank pain, discharge, difficulty urinating and genital sores.  Musculoskeletal: Negative for myalgias, back pain, joint swelling, arthralgias, gait problem and neck stiffness.  Skin: Negative for rash.  Neurological: Positive for numbness. Negative for dizziness, syncope, speech difficulty, weakness and headaches.  Hematological: Negative for adenopathy. Does not bruise/bleed easily.  Psychiatric/Behavioral: Negative for behavioral problems and dysphoric mood. The patient is not nervous/anxious.  Objective:   Physical Exam  Constitutional: He is oriented to person, place, and time. He appears well-developed.  Blood pressure low normal  HENT:  Head: Normocephalic.  Right Ear: External ear normal.  Left Ear: External ear normal.  Eyes: Conjunctivae and EOM are normal.  Neck: Normal range of motion.  Cardiovascular: Normal rate and normal heart sounds.   Pulmonary/Chest: Breath sounds normal.  Abdominal: Bowel sounds are normal.  Musculoskeletal: Normal range of motion. He exhibits no edema or tenderness.  Neurological: He is alert and oriented to  person, place, and time.  Psychiatric: He has a normal mood and affect. His behavior is normal.          Assessment & Plan:   Diabetes mellitus.  Will check a hemoglobin A1c.  This has been well-controlled Dyslipidemia.  Statin therapy.  Discussed patient declines Osteoarthritis  Check hemoglobin A1c CPX 6 months

## 2014-02-24 NOTE — Progress Notes (Signed)
Pre visit review using our clinic review tool, if applicable. No additional management support is needed unless otherwise documented below in the visit note. 

## 2014-02-27 ENCOUNTER — Telehealth: Payer: Self-pay | Admitting: *Deleted

## 2014-02-27 DIAGNOSIS — Z86008 Personal history of in-situ neoplasm of other site: Secondary | ICD-10-CM | POA: Diagnosis not present

## 2014-02-27 DIAGNOSIS — L570 Actinic keratosis: Secondary | ICD-10-CM | POA: Diagnosis not present

## 2014-02-27 DIAGNOSIS — Z85828 Personal history of other malignant neoplasm of skin: Secondary | ICD-10-CM | POA: Diagnosis not present

## 2014-02-27 NOTE — Telephone Encounter (Signed)
Spoke to pt, told him hemoglobin A1c came back at 6.0 very good. Pt verbalized understanding.

## 2014-04-29 ENCOUNTER — Other Ambulatory Visit: Payer: Self-pay | Admitting: Internal Medicine

## 2014-04-29 NOTE — Telephone Encounter (Signed)
Sent to the pharmacy by e-scribe.  Pt has upcoming Medicare Wellness on 09/02/14 scheduled.

## 2014-07-23 DIAGNOSIS — C44122 Squamous cell carcinoma of skin of right eyelid, including canthus: Secondary | ICD-10-CM | POA: Diagnosis not present

## 2014-07-23 DIAGNOSIS — L570 Actinic keratosis: Secondary | ICD-10-CM | POA: Diagnosis not present

## 2014-07-23 DIAGNOSIS — Z86008 Personal history of in-situ neoplasm of other site: Secondary | ICD-10-CM | POA: Diagnosis not present

## 2014-07-23 DIAGNOSIS — Z85828 Personal history of other malignant neoplasm of skin: Secondary | ICD-10-CM | POA: Diagnosis not present

## 2014-07-23 DIAGNOSIS — Z9889 Other specified postprocedural states: Secondary | ICD-10-CM | POA: Diagnosis not present

## 2014-08-27 DIAGNOSIS — C44319 Basal cell carcinoma of skin of other parts of face: Secondary | ICD-10-CM | POA: Diagnosis not present

## 2014-08-27 DIAGNOSIS — C44329 Squamous cell carcinoma of skin of other parts of face: Secondary | ICD-10-CM | POA: Diagnosis not present

## 2014-09-02 ENCOUNTER — Ambulatory Visit (INDEPENDENT_AMBULATORY_CARE_PROVIDER_SITE_OTHER): Payer: Medicare Other | Admitting: Internal Medicine

## 2014-09-02 ENCOUNTER — Encounter: Payer: Self-pay | Admitting: Internal Medicine

## 2014-09-02 VITALS — BP 114/70 | HR 60 | Temp 98.0°F | Resp 18 | Ht 70.75 in | Wt 206.0 lb

## 2014-09-02 DIAGNOSIS — Z0000 Encounter for general adult medical examination without abnormal findings: Secondary | ICD-10-CM | POA: Diagnosis not present

## 2014-09-02 DIAGNOSIS — E119 Type 2 diabetes mellitus without complications: Secondary | ICD-10-CM | POA: Diagnosis not present

## 2014-09-02 DIAGNOSIS — E785 Hyperlipidemia, unspecified: Secondary | ICD-10-CM | POA: Diagnosis not present

## 2014-09-02 DIAGNOSIS — E0841 Diabetes mellitus due to underlying condition with diabetic mononeuropathy: Secondary | ICD-10-CM | POA: Diagnosis not present

## 2014-09-02 LAB — CBC WITH DIFFERENTIAL/PLATELET
BASOS PCT: 0.4 % (ref 0.0–3.0)
Basophils Absolute: 0 10*3/uL (ref 0.0–0.1)
Eosinophils Absolute: 0.1 10*3/uL (ref 0.0–0.7)
Eosinophils Relative: 1.7 % (ref 0.0–5.0)
HEMATOCRIT: 39.6 % (ref 39.0–52.0)
HEMOGLOBIN: 13.4 g/dL (ref 13.0–17.0)
Lymphocytes Relative: 24.5 % (ref 12.0–46.0)
Lymphs Abs: 1.1 10*3/uL (ref 0.7–4.0)
MCHC: 33.8 g/dL (ref 30.0–36.0)
MCV: 89.7 fl (ref 78.0–100.0)
MONOS PCT: 9.7 % (ref 3.0–12.0)
Monocytes Absolute: 0.4 10*3/uL (ref 0.1–1.0)
NEUTROS ABS: 2.9 10*3/uL (ref 1.4–7.7)
NEUTROS PCT: 63.7 % (ref 43.0–77.0)
Platelets: 196 10*3/uL (ref 150.0–400.0)
RBC: 4.41 Mil/uL (ref 4.22–5.81)
RDW: 14.3 % (ref 11.5–15.5)
WBC: 4.5 10*3/uL (ref 4.0–10.5)

## 2014-09-02 LAB — LIPID PANEL
CHOL/HDL RATIO: 5
CHOLESTEROL: 204 mg/dL — AB (ref 0–200)
HDL: 41.7 mg/dL (ref >39.00)
NonHDL: 162.3
Triglycerides: 202 mg/dL — ABNORMAL HIGH (ref 0.0–149.0)
VLDL: 40.4 mg/dL — ABNORMAL HIGH (ref 0.0–40.0)

## 2014-09-02 LAB — MICROALBUMIN / CREATININE URINE RATIO
Creatinine,U: 261.3 mg/dL
MICROALB/CREAT RATIO: 0.3 mg/g (ref 0.0–30.0)
Microalb, Ur: <0.7 mg/dL (ref 0.0–1.9)

## 2014-09-02 LAB — LDL CHOLESTEROL, DIRECT: Direct LDL: 119 mg/dL

## 2014-09-02 LAB — HEMOGLOBIN A1C: HEMOGLOBIN A1C: 5.7 % (ref 4.6–6.5)

## 2014-09-02 LAB — TSH: TSH: 2.48 u[IU]/mL (ref 0.35–4.50)

## 2014-09-02 NOTE — Patient Instructions (Signed)
Limit your sodium (Salt) intake    It is important that you exercise regularly, at least 20 minutes 3 to 4 times per week.  If you develop chest pain or shortness of breath seek  medical attention.  Return in 6 months for follow-up  

## 2014-09-02 NOTE — Progress Notes (Signed)
Subjective:    Patient ID: Tyler Greer, male    DOB: 12-07-1935, 79 y.o.   MRN: [id number]  HPI    Wt Readings from Last 3 Encounters:  09/02/14 206 lb (93.441 kg)  02/24/14 205 lb (92.987 kg)  08/28/13 198 lb (89.812 kg)    Subjective:    Patient ID: Tyler Greer, male    DOB: Feb 06, 1936, 79 y.o.   MRN: [id number]  HPI  History of Present Illness:   79    year-old patient who is seen today for a comprehensive examination.  He has an approximate  5 year history diabetes mellitus. He presented with a random blood sugar of  719 about  5 years  ago and has been under excellent control. Since that time. Blood sugars are occasionally less than 70. Marland Kitchen He is also on oral medications. He has a history of lower extremity paresthesias and also an intention tremor. He has osteoarthritis. He has a history of mild testosterone deficiency  His last colonoscopy was in 2000.  He does not wish to have any further colonoscopies.  He has had an eye exam within the past year  1. Risk factors, based on past  M,S,F history-  cardiovascular risk factors include a history of type 2 diabetes  2.  Physical activities: Remains quite active still works part-time walks daily in the mornings  3.  Depression/mood: No history depression or mood disorder 4.  Hearing:  No deficits  5.  ADL's: Independent in all aspects of daily living  6.  Fall risk: Low  7.  Home safety: No problems identified  8.  Height weight, and visual acuity; height and weight stable;  is scheduled to see a retinal specialist next month  9.  Counseling: Followup eye exam. He will consider a followup screening colonoscopy  10. Lab orders based on risk factors: Laboratory panel including hemoglobin A1c will be reviewed  11. Referral: Follow-up dermatology  12. Care plan: Continue a regular exercise program proper diet  13. Cognitive assessment: Alert and oriented normal affect. No cognitive dysfunction.  14.  Preventive  services will include annual examinations, as well as clinical physical examinations with screening lab.  Patient has declined any further screening colonoscopies. Patient was provided with a written and personalized care plan  15.  Provider list includes primary care ophthalmology and dermatology      Preventive Screening-Counseling & Management  Alcohol-Tobacco  Smoking Status: never  Caffeine-Diet-Exercise  Does Patient Exercise: yes   Allergies:  No Known Drug Allergies   Past History:  Past Medical History:   Osteoarthritis  Diabetes mellitus, type II onset September 2009  intention tremor  paresthesias of the feet   Past Surgical History:  Tonsillectomy  Lumbar laminectomyx 2  bilateral arthroscopic knee surgery 2002  colonoscopy approximately 2000  foot surgery for hammertoe deformities   Family History:   mother died at 31- history of impaired glucose tolerance  father died age 25, [place]  two brothers two sisters  one brother deceased of a melanoma; history of diabetes, type II   Social History:   Widower Never Smoked  Regular exercise-yes  Smoking Status: never  Does Patient Exercise: yes      Review of Systems  Constitutional: Negative for fever, chills, activity change, appetite change and fatigue.  HENT: Negative for hearing loss, ear pain, congestion, rhinorrhea, sneezing, mouth sores, trouble swallowing, neck pain, neck stiffness, dental problem, voice change, sinus pressure and tinnitus.   Eyes: Negative for  photophobia, pain, redness and visual disturbance.  Respiratory: Negative for apnea, cough, choking, chest tightness, shortness of breath and wheezing.   Cardiovascular: Negative for chest pain, palpitations and leg swelling.  Gastrointestinal: Negative for nausea, vomiting, abdominal pain, diarrhea, constipation, blood in stool, abdominal distention, anal bleeding and rectal pain.  Genitourinary: Negative for dysuria, urgency, frequency,  hematuria, flank pain, decreased urine volume, discharge, penile swelling, scrotal swelling, difficulty urinating, genital sores and testicular pain.  Musculoskeletal: Negative for myalgias, back pain, joint swelling, arthralgias and gait problem.  Skin: Negative for color change, rash and wound.  Neurological: Negative for dizziness, tremors, seizures, syncope, facial asymmetry, speech difficulty, weakness, light-headedness, numbness and headaches.  Hematological: Negative for adenopathy. Does not bruise/bleed easily.  Psychiatric/Behavioral: Negative for suicidal ideas, hallucinations, behavioral problems, confusion, sleep disturbance, self-injury, dysphoric mood, decreased concentration and agitation. The patient is not nervous/anxious.        Objective:   Physical Exam  Constitutional: He appears well-developed and well-nourished.  HENT:  Head: Normocephalic and atraumatic.  Right Ear: External ear normal.  Left Ear: External ear normal.  Nose: Nose normal.  Mouth/Throat: Oropharynx is clear and moist.  Eyes: Conjunctivae and EOM are normal. Pupils are equal, round, and reactive to light. No scleral icterus.  Neck: Normal range of motion. Neck supple. No JVD present. No thyromegaly present.  Cardiovascular: Regular rhythm, normal heart sounds and intact distal pulses.  Exam reveals no gallop and no friction rub.   No murmur heard. Pulmonary/Chest: Effort normal and breath sounds normal. He exhibits no tenderness.  Abdominal: Soft. Bowel sounds are normal. He exhibits no distension and no mass. There is no tenderness.  Genitourinary: Prostate normal and penis normal.  Musculoskeletal: Normal range of motion. He exhibits no edema and no tenderness.  Lymphadenopathy:    He has no cervical adenopathy.  Neurological: He is alert. He has normal reflexes. No cranial nerve deficit. Coordination normal.       Mild decreased vibratory sensation of his feet   Skin: Skin is warm and dry. No  rash noted.  Psychiatric: He has a normal mood and affect. His behavior is normal.          Assessment & Plan:    Preventive health examination Type 2 diabetes.  We'll check a hemoglobin A1c.  Statin therapy discussed.  He wishes to defer at bedtime.  He is aware that there are treatment options for tremor and diabetic peripheral neuropathy.  If these symptoms worsen  We'll continue active lifestyle. His weight is ideal at present continue proper diet. Hemoglobin A1c will be checked   He will consider a followup colonoscopy. He is scheduled for followup eye exam  .Review of Systems     as above Objective:   Physical Exam  As above        Assessment & Plan:   As above

## 2014-09-02 NOTE — Progress Notes (Signed)
Pre visit review using our clinic review tool, if applicable. No additional management support is needed unless otherwise documented below in the visit note. 

## 2014-09-19 NOTE — Progress Notes (Signed)
   Subjective:    Patient ID: Tyler Greer, male    DOB: 1936-01-10, 79 y.o.   MRN: [id number]  HPI  EKG reviewed.  This revealed a right bundle branch block as well as a left anterior hemiblock.  No change from prior tracings  Review of Systems     Objective:   Physical Exam        Assessment & Plan:

## 2014-09-29 DIAGNOSIS — L570 Actinic keratosis: Secondary | ICD-10-CM | POA: Diagnosis not present

## 2014-09-29 DIAGNOSIS — L821 Other seborrheic keratosis: Secondary | ICD-10-CM | POA: Diagnosis not present

## 2014-09-29 DIAGNOSIS — C44329 Squamous cell carcinoma of skin of other parts of face: Secondary | ICD-10-CM | POA: Diagnosis not present

## 2014-10-08 DIAGNOSIS — C44329 Squamous cell carcinoma of skin of other parts of face: Secondary | ICD-10-CM | POA: Diagnosis not present

## 2014-10-08 DIAGNOSIS — Z85828 Personal history of other malignant neoplasm of skin: Secondary | ICD-10-CM | POA: Diagnosis not present

## 2014-11-25 ENCOUNTER — Other Ambulatory Visit: Payer: Self-pay | Admitting: Internal Medicine

## 2014-11-28 ENCOUNTER — Encounter: Payer: Self-pay | Admitting: Podiatry

## 2014-11-28 ENCOUNTER — Ambulatory Visit (INDEPENDENT_AMBULATORY_CARE_PROVIDER_SITE_OTHER): Payer: Medicare Other

## 2014-11-28 ENCOUNTER — Ambulatory Visit (INDEPENDENT_AMBULATORY_CARE_PROVIDER_SITE_OTHER): Payer: Medicare Other | Admitting: Podiatry

## 2014-11-28 DIAGNOSIS — R52 Pain, unspecified: Secondary | ICD-10-CM | POA: Diagnosis not present

## 2014-11-28 DIAGNOSIS — M2012 Hallux valgus (acquired), left foot: Secondary | ICD-10-CM

## 2014-11-28 DIAGNOSIS — M2450 Contracture, unspecified joint: Secondary | ICD-10-CM | POA: Diagnosis not present

## 2014-11-28 DIAGNOSIS — M21822 Other specified acquired deformities of left upper arm: Secondary | ICD-10-CM

## 2014-11-28 NOTE — Progress Notes (Signed)
   Subjective:    Patient ID: Tyler Greer, male    DOB: 09/16/1935, 79 y.o.   MRN: [id number]  HPI  79 year old male presents the office with concerns of his left big toe crossing over the second digit. He states he previously underwent bunion surgery with Dr. Valentina Lucks. He states that after the surgery to the toe started to drift over the second toe. He has pain of the toe particularly. And pressure. He is tried offloading padding without any relief. At this time he had to proceed with surgical intervention to help correct the toe. No other complaints at this time.   Review of Systems  All other systems reviewed and are negative.      Objective:   Physical Exam AAO x3, NAD DP/PT pulses palpable bilaterally, CRT less than 3 seconds Protective sensation intact with Simms Weinstein monofilament, vibratory sensation intact, Achilles tendon reflex intact On the left foot the hallux does appear to be somewhat contracted in an extended position as well as abducted leaning over the second toe. Range of motion of the joint is intact in dorsiflexion however somewhat decrease in plantar flexion of the MTPJ. There is no crepitation with MTPJ range of motion. There is irritation of the dorsal aspect of the hallux irritation as well as between the hallux and second toe. There is no open lesions identified this time. No significant hyperkeratotic tissue. No hypermobility of the first ray. No other areas of tenderness to bilateral lower extremities. MMT 5/5, ROM WNL.  No open lesions or pre-ulcerative lesions.  No overlying edema, erythema, increase in warmth to bilateral lower extremities.  No pain with calf compression, swelling, warmth, erythema bilaterally.      Assessment & Plan:   79 year old male with left hallux interphalangeus/hallux abductus. -X-rays were obtained and reviewed with the patient. There is a screw the first metatarsal from previous bunion surgery. Hallux sits in an abducted  position and is overlapping the second digit. -I discussed both conservative and surgical treatment options. This time he wishes to proceed with surgery to help decrease his pain and deformity. -Discussed with him aching osteotomy with MTPJ release and extensor tendon lengthening. He states that he only wants to have one more surgery and do whatever is needed to get the toe straight. I discussed with him that intraoperatively if the cartilage/joint is deteriorated may need MPJ fusion. He agreed to this. He was consented today for either surgery pending intraoperative findings. -The incision placement as well as the postoperative course was discussed with the patient. I discussed risks of the surgery which include, but not limited to, infection, bleeding, pain, swelling, need for further surgery, delayed or nonhealing, painful or ugly scar, numbness or sensation changes, over/under correction, recurrence, transfer lesions, further deformity, hardware failure, DVT/PE, loss of toe/foot. Patient understands these risks and wishes to proceed with surgery. The surgical consent was reviewed with the patient all 3 pages were signed. No promises or guarantees were given to the outcome of the procedure. All questions were answered to the best of my ability. Before the surgery the patient was encouraged to call the office if there is any further questions. The surgery will be performed at the [facility] on an outpatient basis.  Celesta Gentile, DPM

## 2014-11-28 NOTE — Patient Instructions (Signed)
Pre-Operative Instructions  Congratulations, you have decided to take an important step to improving your quality of life.  You can be assured that the doctors of [facility] will be with you every step of the way.  1. Plan to be at the surgery center/hospital at least 1 (one) hour prior to your scheduled time unless otherwise directed by the surgical center/hospital staff.  You must have a responsible adult accompany you, remain during the surgery and drive you home.  Make sure you have directions to the surgical center/hospital and know how to get there on time. 2. For hospital based surgery you will need to obtain a history and physical form from your family physician within 1 month prior to the date of surgery- we will give you a form for you primary physician.  3. We make every effort to accommodate the date you request for surgery.  There are however, times where surgery dates or times have to be moved.  We will contact you as soon as possible if a change in schedule is required.   4. No Aspirin/Ibuprofen for one week before surgery.  If you are on aspirin, any non-steroidal anti-inflammatory medications (Mobic, Aleve, Ibuprofen) you should stop taking it 7 days prior to your surgery.  You make take Tylenol  For pain prior to surgery.  5. Medications- If you are taking daily heart and blood pressure medications, seizure, reflux, allergy, asthma, anxiety, pain or diabetes medications, make sure the surgery center/hospital is aware before the day of surgery so they may notify you which medications to take or avoid the day of surgery. 6. No food or drink after midnight the night before surgery unless directed otherwise by surgical center/hospital staff. 7. No alcoholic beverages 24 hours prior to surgery.  No smoking 24 hours prior to or 24 hours after surgery. 8. Wear loose pants or shorts- loose enough to fit over bandages, boots, and casts. 9. No slip on shoes, sneakers are best. 10. Bring  your boot with you to the surgery center/hospital.  Also bring crutches or a walker if your physician has prescribed it for you.  If you do not have this equipment, it will be provided for you after surgery. 11. If you have not been contracted by the surgery center/hospital by the day before your surgery, call to confirm the date and time of your surgery. 12. Leave-time from work may vary depending on the type of surgery you have.  Appropriate arrangements should be made prior to surgery with your employer. 13. Prescriptions will be provided immediately following surgery by your doctor.  Have these filled as soon as possible after surgery and take the medication as directed. 14. Remove nail polish on the operative foot. 15. Wash the night before surgery.  The night before surgery wash the foot and leg well with the antibacterial soap provided and water paying special attention to beneath the toenails and in between the toes.  Rinse thoroughly with water and dry well with a towel.  Perform this wash unless told not to do so by your physician.  Enclosed: 1 Ice pack (please put in freezer the night before surgery)   1 Hibiclens skin cleaner   Pre-op Instructions  If you have any questions regarding the instructions, do not hesitate to call our office.  [place]: [address] [place], [place] [address] [phone]  [place]: [address] [place], [place] [address] [phone]  [place]: [address]  [place], [place] [address] [phone]  Dr. Richard   Tuchman DPM, Dr. Norman Regal DPM Dr. Richard Sikora DPM, Dr. M. Todd Hyatt DPM, Dr. Kathryn Egerton DPM, Dr. Matthew Wagoner DPM 

## 2014-12-01 ENCOUNTER — Ambulatory Visit (INDEPENDENT_AMBULATORY_CARE_PROVIDER_SITE_OTHER): Payer: Medicare Other | Admitting: *Deleted

## 2014-12-01 DIAGNOSIS — Z23 Encounter for immunization: Secondary | ICD-10-CM | POA: Diagnosis not present

## 2014-12-26 ENCOUNTER — Telehealth: Payer: Self-pay | Admitting: *Deleted

## 2014-12-30 DIAGNOSIS — L570 Actinic keratosis: Secondary | ICD-10-CM | POA: Diagnosis not present

## 2014-12-30 DIAGNOSIS — Z85828 Personal history of other malignant neoplasm of skin: Secondary | ICD-10-CM | POA: Diagnosis not present

## 2014-12-30 NOTE — Telephone Encounter (Signed)
"  Tyler Greer just called and canceled.  He said he had canceled with the office but it was still on our schedule."  I received a call from the surgical center that you canceled your surgery.  "I called your office about 2 weeks ago and told the girl that I wanted to cancel.  She said I'll take care of it."  Would you like to reschedule?  "No, not at this time"

## 2015-01-09 ENCOUNTER — Encounter: Payer: Self-pay | Admitting: Podiatry

## 2015-01-21 ENCOUNTER — Ambulatory Visit: Payer: PRIVATE HEALTH INSURANCE | Admitting: Adult Health

## 2015-05-06 DIAGNOSIS — M25561 Pain in right knee: Secondary | ICD-10-CM | POA: Diagnosis not present

## 2015-05-06 DIAGNOSIS — M25562 Pain in left knee: Secondary | ICD-10-CM | POA: Diagnosis not present

## 2015-05-07 ENCOUNTER — Encounter: Payer: Self-pay | Admitting: Adult Health

## 2015-05-07 ENCOUNTER — Ambulatory Visit (INDEPENDENT_AMBULATORY_CARE_PROVIDER_SITE_OTHER): Payer: Medicare Other | Admitting: Adult Health

## 2015-05-07 VITALS — BP 118/70 | HR 68 | Temp 98.5°F | Ht 70.75 in | Wt 204.9 lb

## 2015-05-07 DIAGNOSIS — Z01818 Encounter for other preprocedural examination: Secondary | ICD-10-CM

## 2015-05-07 DIAGNOSIS — E119 Type 2 diabetes mellitus without complications: Secondary | ICD-10-CM

## 2015-05-07 LAB — POCT GLYCOSYLATED HEMOGLOBIN (HGB A1C): Hemoglobin A1C: 5.6

## 2015-05-07 NOTE — Progress Notes (Signed)
 Subjective:    Patient ID: Tyler Greer, male    DOB: 11/16/1935, 79 y.o.   MRN: [id number]  HPI  79 year old male who presents to the office today for surgical clearance for left knee replacement.    Review of Systems  Constitutional: Negative.   HENT: Negative.   Respiratory: Negative.   Cardiovascular: Negative.   Gastrointestinal: Negative.   Endocrine: Negative.   Musculoskeletal: Positive for arthralgias.  Skin: Negative.   Neurological: Negative.   Hematological: Negative.   Psychiatric/Behavioral: Negative.   All other systems reviewed and are negative.  Past Medical History  Diagnosis Date  . Osteoarthritis   . Diabetes mellitus type II 10/2007  . Intention tremor   . Paresthesia of foot     bilateral    Social History   Social History  . Marital Status: Widowed    Spouse Name: N/A  . Number of Children: N/A  . Years of Education: N/A   Occupational History  . Not on file.   Social History Main Topics  . Smoking status: Never Smoker   . Smokeless tobacco: Never Used  . Alcohol Use: No  . Drug Use: No  . Sexual Activity: Not on file   Other Topics Concern  . Not on file   Social History Narrative   Designated third party release signed on 07/13/09    Past Surgical History  Procedure Laterality Date  . Tonsillectomy    . Lumbar laminectomy      times 2  . Knee arthroscopy  2002    bilateral  . Hammer toe surgery      Family History  Problem Relation Age of Onset  . Heart disease Father     died age 67-MI  . Diabetes type II Brother   . Melanoma Brother     No Known Allergies  Current Outpatient Prescriptions on File Prior to Visit  Medication Sig Dispense Refill  . ACCU-CHEK COMPACT TEST DRUM test strip CHECK BLOOD GLOCOSE (SUGAR) DAILY 102 each 3  . Lancets Misc. (ACCU-CHEK MULTICLIX LANCET DEV) KIT AS DIRECTED 100 each 3  . metFORMIN (GLUCOPHAGE-XR) 500 MG 24 hr tablet TAKE TWO TABLETS EVERY DAY 180 tablet 3  . sildenafil  (VIAGRA) 100 MG tablet Take 0.5-1 tablets (50-100 mg total) by mouth as needed for erectile dysfunction. 5 tablet 2   No current facility-administered medications on file prior to visit.    BP 118/70 mmHg  Pulse 68  Temp(Src) 98.5 F (36.9 C) (Oral)  Ht 5' 10.75" (1.797 m)  Wt 204 lb 14.4 oz (92.942 kg)  BMI 28.78 kg/m2  SpO2 98%       Objective:   Physical Exam  Constitutional: He is oriented to person, place, and time. He appears well-developed and well-nourished. No distress.  HENT:  Head: Normocephalic and atraumatic.  Right Ear: External ear normal.  Left Ear: External ear normal.  Nose: Nose normal.  Mouth/Throat: Oropharynx is clear and moist. No oropharyngeal exudate.  Neck: Normal range of motion. Neck supple. No thyromegaly present.  Cardiovascular: Normal rate, regular rhythm, normal heart sounds and intact distal pulses.  Exam reveals no gallop.   No murmur heard. Pulmonary/Chest: Effort normal and breath sounds normal. No respiratory distress. He has no wheezes. He has no rales. He exhibits no tenderness.  Abdominal: Soft. Bowel sounds are normal. He exhibits no distension and no mass. There is no tenderness. There is no rebound and no guarding.  Musculoskeletal: Normal range of motion.   He exhibits no edema or tenderness.  Lymphadenopathy:    He has no cervical adenopathy.  Neurological: He is alert and oriented to person, place, and time.  Skin: Skin is warm and dry. No rash noted. He is not diaphoretic. No erythema. No pallor.  Psychiatric: He has a normal mood and affect. His behavior is normal. Judgment and thought content normal.  Vitals reviewed.      Assessment & Plan:  1. Pre-op examination  - EKG 12-Lead - Sinus Rhythm,Rate 61. RBBB. No new EKG changes.  - POC HgB A1c -Patient cleared for surgery   2. Type 2 diabetes mellitus without complication, without long-term current use of insulin (HCC) - POC HgB A1c  

## 2015-05-07 NOTE — Progress Notes (Signed)
Pre visit review using our clinic review tool, if applicable. No additional management support is needed unless otherwise documented below in the visit note. 

## 2015-05-11 ENCOUNTER — Telehealth: Payer: Self-pay | Admitting: Internal Medicine

## 2015-05-11 NOTE — Telephone Encounter (Signed)
Pt call to ask if the letter for surgery clearance has been fax to [facility]

## 2015-05-11 NOTE — Telephone Encounter (Signed)
Called and spoke with Tammy and [place] [facility] - she will fax over new surgical clearance form. Will await form. Thanks!

## 2015-05-12 NOTE — Telephone Encounter (Signed)
Received surgical clearance papework was signed  And faxed back

## 2015-05-29 NOTE — Pre-Procedure Instructions (Signed)
Tyler Greer  05/29/2015      [facility] - [place], [place] - [address] [place] [address] Phone: [phone] Fax: [phone]    Your procedure is scheduled on Wednesday, April 19  Report to [facility] Admitting at 10:30 A.M.  Call this number if you have problems the morning of surgery:  [phone]               Any questions prior to surgery call [phone] Monday-Friday 8am-4pm   Remember:  Do not eat food or drink liquids after midnight on Tuesday, April 18   Take these medicines the morning of surgery with A SIP OF WATER : none             Stop Bio Flex medicine on Wed. April 12. Don't take any advil, ibuprofen, motrin, goody's, BC'C also.   How to Manage Your Diabetes Before and After Surgery  Why is it important to control my blood sugar before and after surgery? . Improving blood sugar levels before and after surgery helps healing and can limit problems. . A way of improving blood sugar control is eating a healthy diet by: o  Eating less sugar and carbohydrates o  Increasing activity/exercise o  Talking with your doctor about reaching your blood sugar goals . High blood sugars (greater than 180 mg/dL) can raise your risk of infections and slow your recovery, so you will need to focus on controlling your diabetes during the weeks before surgery. . Make sure that the doctor who takes care of your diabetes knows about your planned surgery including the date and location.  How do I manage my blood sugar before surgery? . Check your blood sugar at least 4 times a day, starting 2 days before surgery, to make sure that the level is not too high or low. o Check your blood sugar the morning of your surgery when you wake up and every 2 hours until you get to the Short Stay unit. . If your blood sugar is less than 70 mg/dL, you will need to treat for low blood sugar: o Do not take insulin. o Treat a low blood sugar (less than  70 mg/dL) with  cup of clear juice (cranberry or apple), 4 glucose tablets, OR glucose gel. o Recheck blood sugar in 15 minutes after treatment (to make sure it is greater than 70 mg/dL). If your blood sugar is not greater than 70 mg/dL on recheck, call [phone] for further instructions. . Report your blood sugar to the short stay nurse when you get to Short Stay.  . If you are admitted to the hospital after surgery: o Your blood sugar will be checked by the staff and you will probably be given insulin after surgery (instead of oral diabetes medicines) to make sure you have good blood sugar levels. o The goal for blood sugar control after surgery is 80-180 mg/dL.       WHAT DO I DO ABOUT MY DIABETES MEDICATION?   Marland Kitchen Do not take oral diabetes medicines (pills) the morning of surgery.     Do not wear jewelry.  Do not wear lotions, powders, or perfumes.  You may not wear deodorant.  Do not shave 48 hours prior to surgery.  Men may shave face and neck.  Do not bring valuables to the hospital.  [facility] is not responsible for any belongings or valuables.  Contacts, dentures or bridgework may not be  worn into surgery.  Leave your suitcase in the car.  After surgery it may be brought to your room.  For patients admitted to the hospital, discharge time will be determined by your treatment team.  Patients discharged the day of surgery will not be allowed to drive home.    Special instructions: review preparing for surgery handout  Please read over the following fact sheets that you were given. Pain Booklet, Coughing and Deep Breathing, Blood Transfusion Information, Total Joint Packet, MRSA Information and Surgical Site Infection Prevention

## 2015-06-01 ENCOUNTER — Encounter (HOSPITAL_COMMUNITY)
Admission: RE | Admit: 2015-06-01 | Discharge: 2015-06-01 | Disposition: A | Discharge status: Home / Self Care | Payer: Medicare Other | Source: Ambulatory Visit | Attending: Orthopaedic Surgery | Admitting: Orthopaedic Surgery

## 2015-06-01 ENCOUNTER — Ambulatory Visit (HOSPITAL_COMMUNITY)
Admission: RE | Admit: 2015-06-01 | Discharge: 2015-06-01 | Disposition: A | Discharge status: Home / Self Care | Payer: Medicare Other | Source: Ambulatory Visit | Attending: Surgery | Admitting: Surgery

## 2015-06-01 ENCOUNTER — Encounter (HOSPITAL_COMMUNITY): Payer: Self-pay

## 2015-06-01 DIAGNOSIS — Z01818 Encounter for other preprocedural examination: Secondary | ICD-10-CM | POA: Diagnosis not present

## 2015-06-01 DIAGNOSIS — Z79899 Other long term (current) drug therapy: Secondary | ICD-10-CM | POA: Insufficient documentation

## 2015-06-01 DIAGNOSIS — E785 Hyperlipidemia, unspecified: Secondary | ICD-10-CM | POA: Diagnosis not present

## 2015-06-01 DIAGNOSIS — Z7984 Long term (current) use of oral hypoglycemic drugs: Secondary | ICD-10-CM | POA: Insufficient documentation

## 2015-06-01 DIAGNOSIS — Z01812 Encounter for preprocedural laboratory examination: Secondary | ICD-10-CM | POA: Insufficient documentation

## 2015-06-01 DIAGNOSIS — E1142 Type 2 diabetes mellitus with diabetic polyneuropathy: Secondary | ICD-10-CM | POA: Diagnosis not present

## 2015-06-01 HISTORY — DX: Cerebral infarction, unspecified: I63.9

## 2015-06-01 LAB — URINALYSIS, ROUTINE W REFLEX MICROSCOPIC
Bilirubin Urine: NEGATIVE
Glucose, UA: NEGATIVE mg/dL
HGB URINE DIPSTICK: NEGATIVE
KETONES UR: NEGATIVE mg/dL
Nitrite: NEGATIVE
PROTEIN: NEGATIVE mg/dL
Specific Gravity, Urine: 1.008 (ref 1.005–1.030)
pH: 5 (ref 5.0–8.0)

## 2015-06-01 LAB — COMPREHENSIVE METABOLIC PANEL
ALBUMIN: 3.6 g/dL (ref 3.5–5.0)
ALK PHOS: 60 U/L (ref 38–126)
ALT: 15 U/L — AB (ref 17–63)
AST: 20 U/L (ref 15–41)
Anion gap: 11 (ref 5–15)
BUN: 18 mg/dL (ref 6–20)
CALCIUM: 9.2 mg/dL (ref 8.9–10.3)
CO2: 24 mmol/L (ref 22–32)
CREATININE: 1.07 mg/dL (ref 0.61–1.24)
Chloride: 104 mmol/L (ref 101–111)
GFR calc Af Amer: >60 mL/min (ref >60)
GFR calc non Af Amer: >60 mL/min (ref >60)
GLUCOSE: 94 mg/dL (ref 65–99)
Potassium: 4.6 mmol/L (ref 3.5–5.1)
SODIUM: 139 mmol/L (ref 135–145)
Total Bilirubin: 0.5 mg/dL (ref 0.3–1.2)
Total Protein: 6.8 g/dL (ref 6.5–8.1)

## 2015-06-01 LAB — ABO/RH: ABO/RH(D): A NEG

## 2015-06-01 LAB — CBC
HCT: 42.1 % (ref 39.0–52.0)
HEMOGLOBIN: 14.2 g/dL (ref 13.0–17.0)
MCH: 30.5 pg (ref 26.0–34.0)
MCHC: 33.7 g/dL (ref 30.0–36.0)
MCV: 90.5 fL (ref 78.0–100.0)
Platelets: 201 10*3/uL (ref 150–400)
RBC: 4.65 MIL/uL (ref 4.22–5.81)
RDW: 14 % (ref 11.5–15.5)
WBC: 5.3 10*3/uL (ref 4.0–10.5)

## 2015-06-01 LAB — TYPE AND SCREEN
ABO/RH(D): A NEG
Antibody Screen: NEGATIVE

## 2015-06-01 LAB — URINE MICROSCOPIC-ADD ON

## 2015-06-01 LAB — PROTIME-INR
INR: 0.99 (ref 0.00–1.49)
Prothrombin Time: 13.3 seconds (ref 11.6–15.2)

## 2015-06-01 LAB — SURGICAL PCR SCREEN
MRSA, PCR: NEGATIVE
Staphylococcus aureus: NEGATIVE

## 2015-06-01 LAB — GLUCOSE, CAPILLARY: GLUCOSE-CAPILLARY: 79 mg/dL (ref 65–99)

## 2015-06-01 LAB — APTT: APTT: 30 s (ref 24–37)

## 2015-06-01 NOTE — Progress Notes (Signed)
PCP:Dr. Burnice Logan No cardiologist. Pt. States he doesn't check blood sugars. Last hgb A1C 5.6  On 05-07-15.

## 2015-06-02 NOTE — Progress Notes (Signed)
Anesthesia Chart Review:  Pt is a 80 year old male scheduled for L computer assisted total knee arthroplasty on 06/10/2015 with Dr. Lorin Mercy.   PMH includes:  Stroke (2005), DM. Never smoker. BMI 29  Medications include: metformin  Preoperative labs reviewed.  HgbA1c was 5.6 on 05/07/15.   Chest 06/01/15 x-ray reviewed. No active cardiopulmonary disease.   EKG 05/07/15: sinus rhythm. LAD. RBBB.  PCP is Dr. Bluford Kaufmann, last office visit with Dorothyann Peng, NP who cleared pt medically for surgery.   If no changes, I anticipate pt can proceed with surgery as scheduled.   Willeen Cass, FNP-BC [facility]/Anesthesiology Phone: [phone] 06/02/2015 2:20 PM

## 2015-06-09 MED ORDER — CEFAZOLIN SODIUM-DEXTROSE 2-4 GM/100ML-% IV SOLN
2.0000 g | INTRAVENOUS | Status: AC
  Administered 2015-06-10: 2 g via INTRAVENOUS
  Filled 2015-06-09: qty 100

## 2015-06-09 MED ORDER — CHLORHEXIDINE GLUCONATE 4 % EX LIQD: 60.0000 mL | Freq: Once | CUTANEOUS | Status: DC

## 2015-06-10 ENCOUNTER — Inpatient Hospital Stay (HOSPITAL_COMMUNITY)
Admission: RE | Admit: 2015-06-10 | Discharge: 2015-06-12 | DRG: 470 | Disposition: A | Discharge status: Home Health | Payer: Medicare Other | Source: Ambulatory Visit | Attending: Orthopaedic Surgery | Admitting: Orthopaedic Surgery

## 2015-06-10 ENCOUNTER — Inpatient Hospital Stay (HOSPITAL_COMMUNITY): Payer: Medicare Other

## 2015-06-10 ENCOUNTER — Inpatient Hospital Stay (HOSPITAL_COMMUNITY): Payer: Medicare Other | Admitting: Emergency Medicine

## 2015-06-10 ENCOUNTER — Inpatient Hospital Stay (HOSPITAL_COMMUNITY): Payer: Medicare Other | Admitting: Anesthesiology

## 2015-06-10 ENCOUNTER — Encounter (HOSPITAL_COMMUNITY)
Admission: RE | Disposition: A | Discharge status: Home Health | Payer: Self-pay | Source: Ambulatory Visit | Attending: Orthopaedic Surgery

## 2015-06-10 ENCOUNTER — Encounter (HOSPITAL_COMMUNITY): Payer: Self-pay | Admitting: *Deleted

## 2015-06-10 DIAGNOSIS — M1712 Unilateral primary osteoarthritis, left knee: Secondary | ICD-10-CM | POA: Diagnosis not present

## 2015-06-10 DIAGNOSIS — Z8249 Family history of ischemic heart disease and other diseases of the circulatory system: Secondary | ICD-10-CM

## 2015-06-10 DIAGNOSIS — Z833 Family history of diabetes mellitus: Secondary | ICD-10-CM

## 2015-06-10 DIAGNOSIS — M13842 Other specified arthritis, left hand: Secondary | ICD-10-CM | POA: Diagnosis present

## 2015-06-10 DIAGNOSIS — M179 Osteoarthritis of knee, unspecified: Secondary | ICD-10-CM | POA: Diagnosis not present

## 2015-06-10 DIAGNOSIS — Z96652 Presence of left artificial knee joint: Secondary | ICD-10-CM | POA: Diagnosis not present

## 2015-06-10 DIAGNOSIS — Z85828 Personal history of other malignant neoplasm of skin: Secondary | ICD-10-CM

## 2015-06-10 DIAGNOSIS — Z808 Family history of malignant neoplasm of other organs or systems: Secondary | ICD-10-CM | POA: Diagnosis not present

## 2015-06-10 DIAGNOSIS — E785 Hyperlipidemia, unspecified: Secondary | ICD-10-CM | POA: Diagnosis present

## 2015-06-10 DIAGNOSIS — G8918 Other acute postprocedural pain: Secondary | ICD-10-CM | POA: Diagnosis not present

## 2015-06-10 DIAGNOSIS — Z7984 Long term (current) use of oral hypoglycemic drugs: Secondary | ICD-10-CM

## 2015-06-10 DIAGNOSIS — M13841 Other specified arthritis, right hand: Secondary | ICD-10-CM | POA: Diagnosis present

## 2015-06-10 DIAGNOSIS — Z79899 Other long term (current) drug therapy: Secondary | ICD-10-CM | POA: Diagnosis not present

## 2015-06-10 DIAGNOSIS — Z471 Aftercare following joint replacement surgery: Secondary | ICD-10-CM | POA: Diagnosis not present

## 2015-06-10 DIAGNOSIS — E1140 Type 2 diabetes mellitus with diabetic neuropathy, unspecified: Secondary | ICD-10-CM | POA: Diagnosis not present

## 2015-06-10 DIAGNOSIS — Z8673 Personal history of transient ischemic attack (TIA), and cerebral infarction without residual deficits: Secondary | ICD-10-CM

## 2015-06-10 DIAGNOSIS — Z09 Encounter for follow-up examination after completed treatment for conditions other than malignant neoplasm: Secondary | ICD-10-CM

## 2015-06-10 HISTORY — PX: KNEE ARTHROPLASTY: SHX992

## 2015-06-10 HISTORY — DX: Migraine, unspecified, not intractable, without status migrainosus: G43.909

## 2015-06-10 HISTORY — PX: TOTAL KNEE ARTHROPLASTY: SHX125

## 2015-06-10 HISTORY — DX: Basal cell carcinoma of skin of unspecified parts of face: C44.310

## 2015-06-10 HISTORY — DX: Unspecified osteoarthritis, unspecified site: M19.90

## 2015-06-10 HISTORY — DX: Basal cell carcinoma of skin of unspecified upper limb, including shoulder: C44.611

## 2015-06-10 LAB — GLUCOSE, CAPILLARY
GLUCOSE-CAPILLARY: 139 mg/dL — AB (ref 65–99)
GLUCOSE-CAPILLARY: 161 mg/dL — AB (ref 65–99)
Glucose-Capillary: 103 mg/dL — ABNORMAL HIGH (ref 65–99)

## 2015-06-10 SURGERY — ARTHROPLASTY, KNEE, TOTAL, USING IMAGELESS COMPUTER-ASSISTED NAVIGATION
Anesthesia: General | Site: Knee | Laterality: Left

## 2015-06-10 MED ORDER — INSULIN ASPART 100 UNIT/ML ~~LOC~~ SOLN
0.0000 [IU] | Freq: Three times a day (TID) | SUBCUTANEOUS | Status: DC
  Administered 2015-06-11: 3 [IU] via SUBCUTANEOUS

## 2015-06-10 MED ORDER — SODIUM CHLORIDE 0.9 % IR SOLN
Status: DC | PRN
  Administered 2015-06-10: 3000 mL

## 2015-06-10 MED ORDER — LACTATED RINGERS IV SOLN
INTRAVENOUS | Status: DC
  Administered 2015-06-10 (×2): via INTRAVENOUS

## 2015-06-10 MED ORDER — DEXTROSE 5 % IV SOLN
500.0000 mg | Freq: Four times a day (QID) | INTRAVENOUS | Status: DC | PRN
  Filled 2015-06-10: qty 5

## 2015-06-10 MED ORDER — LABETALOL HCL 5 MG/ML IV SOLN
INTRAVENOUS | Status: DC | PRN
  Administered 2015-06-10 (×2): 5 mg via INTRAVENOUS

## 2015-06-10 MED ORDER — HYDROMORPHONE HCL 1 MG/ML IJ SOLN: 1.0000 mg | INTRAMUSCULAR | Status: DC | PRN

## 2015-06-10 MED ORDER — DOCUSATE SODIUM 100 MG PO CAPS
100.0000 mg | ORAL_CAPSULE | Freq: Two times a day (BID) | ORAL | Status: DC
  Administered 2015-06-11 – 2015-06-12 (×3): 100 mg via ORAL
  Filled 2015-06-10 (×3): qty 1

## 2015-06-10 MED ORDER — 0.9 % SODIUM CHLORIDE (POUR BTL) OPTIME
TOPICAL | Status: DC | PRN
  Administered 2015-06-10: 1000 mL

## 2015-06-10 MED ORDER — CEFAZOLIN SODIUM 1-5 GM-% IV SOLN
1.0000 g | Freq: Three times a day (TID) | INTRAVENOUS | Status: AC
  Administered 2015-06-10 – 2015-06-11 (×2): 1 g via INTRAVENOUS
  Filled 2015-06-10 (×2): qty 50

## 2015-06-10 MED ORDER — OXYCODONE HCL 5 MG/5ML PO SOLN: 5.0000 mg | Freq: Once | ORAL | Status: DC | PRN

## 2015-06-10 MED ORDER — ONDANSETRON HCL 4 MG/2ML IJ SOLN
INTRAMUSCULAR | Status: AC
  Filled 2015-06-10: qty 2

## 2015-06-10 MED ORDER — ONDANSETRON HCL 4 MG/2ML IJ SOLN
4.0000 mg | Freq: Once | INTRAMUSCULAR | Status: AC | PRN
  Administered 2015-06-10: 4 mg via INTRAVENOUS

## 2015-06-10 MED ORDER — MENTHOL 3 MG MT LOZG: 1.0000 | LOZENGE | OROMUCOSAL | Status: DC | PRN

## 2015-06-10 MED ORDER — FENTANYL CITRATE (PF) 100 MCG/2ML IJ SOLN
INTRAMUSCULAR | Status: AC
  Filled 2015-06-10: qty 2

## 2015-06-10 MED ORDER — SODIUM CHLORIDE 0.45 % IV SOLN: INTRAVENOUS | Status: DC

## 2015-06-10 MED ORDER — FENTANYL CITRATE (PF) 100 MCG/2ML IJ SOLN
INTRAMUSCULAR | Status: AC
  Administered 2015-06-10: 50 ug
  Filled 2015-06-10: qty 2

## 2015-06-10 MED ORDER — OXYCODONE HCL 5 MG PO TABS
5.0000 mg | ORAL_TABLET | ORAL | Status: DC | PRN
  Administered 2015-06-11 – 2015-06-12 (×6): 10 mg via ORAL
  Filled 2015-06-10 (×7): qty 2

## 2015-06-10 MED ORDER — LIDOCAINE HCL (CARDIAC) 20 MG/ML IV SOLN
INTRAVENOUS | Status: DC | PRN
  Administered 2015-06-10: 40 mg via INTRAVENOUS

## 2015-06-10 MED ORDER — ASPIRIN EC 325 MG PO TBEC
325.0000 mg | DELAYED_RELEASE_TABLET | Freq: Every day | ORAL | Status: DC
  Administered 2015-06-11 – 2015-06-12 (×2): 325 mg via ORAL
  Filled 2015-06-10 (×2): qty 1

## 2015-06-10 MED ORDER — METFORMIN HCL ER 500 MG PO TB24
500.0000 mg | ORAL_TABLET | Freq: Two times a day (BID) | ORAL | Status: DC
  Administered 2015-06-10 – 2015-06-12 (×3): 500 mg via ORAL
  Filled 2015-06-10 (×3): qty 1

## 2015-06-10 MED ORDER — ONDANSETRON HCL 4 MG/2ML IJ SOLN
INTRAMUSCULAR | Status: DC | PRN
  Administered 2015-06-10: 4 mg via INTRAVENOUS

## 2015-06-10 MED ORDER — POLYETHYLENE GLYCOL 3350 17 G PO PACK: 17.0000 g | PACK | Freq: Every day | ORAL | Status: DC | PRN

## 2015-06-10 MED ORDER — MIDAZOLAM HCL 5 MG/5ML IJ SOLN: INTRAMUSCULAR | Status: DC | PRN

## 2015-06-10 MED ORDER — FENTANYL CITRATE (PF) 100 MCG/2ML IJ SOLN
25.0000 ug | INTRAMUSCULAR | Status: DC | PRN
  Administered 2015-06-10 (×2): 50 ug via INTRAVENOUS

## 2015-06-10 MED ORDER — FENTANYL CITRATE (PF) 100 MCG/2ML IJ SOLN
INTRAMUSCULAR | Status: DC | PRN
  Administered 2015-06-10: 50 ug via INTRAVENOUS
  Administered 2015-06-10 (×2): 100 ug via INTRAVENOUS
  Administered 2015-06-10: 50 ug via INTRAVENOUS

## 2015-06-10 MED ORDER — ONDANSETRON HCL 4 MG PO TABS: 4.0000 mg | ORAL_TABLET | Freq: Four times a day (QID) | ORAL | Status: DC | PRN

## 2015-06-10 MED ORDER — MIDAZOLAM HCL 2 MG/2ML IJ SOLN
INTRAMUSCULAR | Status: AC
  Administered 2015-06-10: 1 mg
  Filled 2015-06-10: qty 2

## 2015-06-10 MED ORDER — ACETAMINOPHEN 650 MG RE SUPP: 650.0000 mg | Freq: Four times a day (QID) | RECTAL | Status: DC | PRN

## 2015-06-10 MED ORDER — BUPIVACAINE HCL (PF) 0.25 % IJ SOLN
INTRAMUSCULAR | Status: AC
  Filled 2015-06-10: qty 30

## 2015-06-10 MED ORDER — BUPIVACAINE LIPOSOME 1.3 % IJ SUSP
INTRAMUSCULAR | Status: DC | PRN
  Administered 2015-06-10: 20 mL

## 2015-06-10 MED ORDER — METHOCARBAMOL 500 MG PO TABS
500.0000 mg | ORAL_TABLET | Freq: Four times a day (QID) | ORAL | Status: DC | PRN
  Administered 2015-06-11 – 2015-06-12 (×5): 500 mg via ORAL
  Filled 2015-06-10 (×5): qty 1

## 2015-06-10 MED ORDER — BUPIVACAINE HCL (PF) 0.25 % IJ SOLN
INTRAMUSCULAR | Status: DC | PRN
  Administered 2015-06-10: 20 mL

## 2015-06-10 MED ORDER — FENTANYL CITRATE (PF) 250 MCG/5ML IJ SOLN
INTRAMUSCULAR | Status: AC
  Filled 2015-06-10: qty 5

## 2015-06-10 MED ORDER — PHENOL 1.4 % MT LIQD: 1.0000 | OROMUCOSAL | Status: DC | PRN

## 2015-06-10 MED ORDER — BUPIVACAINE LIPOSOME 1.3 % IJ SUSP
20.0000 mL | INTRAMUSCULAR | Status: DC
  Filled 2015-06-10: qty 20

## 2015-06-10 MED ORDER — OXYCODONE HCL 5 MG PO TABS: 5.0000 mg | ORAL_TABLET | Freq: Once | ORAL | Status: DC | PRN

## 2015-06-10 MED ORDER — METOCLOPRAMIDE HCL 5 MG/ML IJ SOLN
5.0000 mg | Freq: Three times a day (TID) | INTRAMUSCULAR | Status: DC | PRN
  Administered 2015-06-11: 10 mg via INTRAVENOUS
  Filled 2015-06-10: qty 2

## 2015-06-10 MED ORDER — ONDANSETRON HCL 4 MG/2ML IJ SOLN
4.0000 mg | Freq: Four times a day (QID) | INTRAMUSCULAR | Status: DC | PRN
  Administered 2015-06-10: 4 mg via INTRAVENOUS
  Filled 2015-06-10: qty 2

## 2015-06-10 MED ORDER — ACETAMINOPHEN 325 MG PO TABS
650.0000 mg | ORAL_TABLET | Freq: Four times a day (QID) | ORAL | Status: DC | PRN
  Administered 2015-06-11: 650 mg via ORAL
  Filled 2015-06-10: qty 2

## 2015-06-10 MED ORDER — METOCLOPRAMIDE HCL 5 MG PO TABS: 5.0000 mg | ORAL_TABLET | Freq: Three times a day (TID) | ORAL | Status: DC | PRN

## 2015-06-10 MED ORDER — PROPOFOL 10 MG/ML IV BOLUS
INTRAVENOUS | Status: DC | PRN
  Administered 2015-06-10: 160 mg via INTRAVENOUS

## 2015-06-10 SURGICAL SUPPLY — 78 items
APL SKNCLS STERI-STRIP NONHPOA (GAUZE/BANDAGES/DRESSINGS) ×1
BANDAGE ELASTIC 4 VELCRO ST LF (GAUZE/BANDAGES/DRESSINGS) ×5 IMPLANT
BANDAGE ESMARK 6X9 LF (GAUZE/BANDAGES/DRESSINGS) ×1 IMPLANT
BENZOIN TINCTURE PRP APPL 2/3 (GAUZE/BANDAGES/DRESSINGS) ×3 IMPLANT
BLADE SAGITTAL 25.0X1.19X90 (BLADE) ×2 IMPLANT
BLADE SAGITTAL 25.0X1.19X90MM (BLADE) ×1
BLADE SAW SGTL 13X75X1.27 (BLADE) ×3 IMPLANT
BNDG CMPR 9X6 STRL LF SNTH (GAUZE/BANDAGES/DRESSINGS) ×1
BNDG CMPR MED 10X6 ELC LF (GAUZE/BANDAGES/DRESSINGS) ×1
BNDG ELASTIC 6X10 VLCR STRL LF (GAUZE/BANDAGES/DRESSINGS) ×3 IMPLANT
BNDG ESMARK 6X9 LF (GAUZE/BANDAGES/DRESSINGS) ×3
BOWL SMART MIX CTS (DISPOSABLE) ×3 IMPLANT
CAP KNEE TOTAL 3 SIGMA ×2 IMPLANT
CEMENT HV SMART SET (Cement) ×6 IMPLANT
CLOSURE STERI-STRIP 1/2X4 (GAUZE/BANDAGES/DRESSINGS) ×2
CLOSURE WOUND 1/2 X4 (GAUZE/BANDAGES/DRESSINGS) ×1
CLSR STERI-STRIP ANTIMIC 1/2X4 (GAUZE/BANDAGES/DRESSINGS) ×4 IMPLANT
COVER SURGICAL LIGHT HANDLE (MISCELLANEOUS) ×3 IMPLANT
CUFF TOURNIQUET SINGLE 34IN LL (TOURNIQUET CUFF) ×3 IMPLANT
CUFF TOURNIQUET SINGLE 44IN (TOURNIQUET CUFF) IMPLANT
DRAPE ORTHO SPLIT 77X108 STRL (DRAPES) ×6
DRAPE SURG ORHT 6 SPLT 77X108 (DRAPES) ×2 IMPLANT
DRAPE U-SHAPE 47X51 STRL (DRAPES) ×3 IMPLANT
DRSG PAD ABDOMINAL 8X10 ST (GAUZE/BANDAGES/DRESSINGS) ×6 IMPLANT
DURAPREP 26ML APPLICATOR (WOUND CARE) ×3 IMPLANT
ELECT REM PT RETURN 9FT ADLT (ELECTROSURGICAL) ×3
ELECTRODE REM PT RTRN 9FT ADLT (ELECTROSURGICAL) ×1 IMPLANT
FACESHIELD WRAPAROUND (MASK) ×6 IMPLANT
FACESHIELD WRAPAROUND OR TEAM (MASK) ×2 IMPLANT
GAUZE SPONGE 4X4 12PLY STRL (GAUZE/BANDAGES/DRESSINGS) ×6 IMPLANT
GAUZE XEROFORM 5X9 LF (GAUZE/BANDAGES/DRESSINGS) ×3 IMPLANT
GLOVE BIO SURGEON STRL SZ7 (GLOVE) ×6 IMPLANT
GLOVE BIOGEL PI IND STRL 7.0 (GLOVE) IMPLANT
GLOVE BIOGEL PI IND STRL 8 (GLOVE) ×2 IMPLANT
GLOVE BIOGEL PI INDICATOR 7.0 (GLOVE) ×6
GLOVE BIOGEL PI INDICATOR 8 (GLOVE) ×4
GLOVE ORTHO TXT STRL SZ7.5 (GLOVE) ×6 IMPLANT
GOWN STRL REUS W/ TWL LRG LVL3 (GOWN DISPOSABLE) ×1 IMPLANT
GOWN STRL REUS W/ TWL XL LVL3 (GOWN DISPOSABLE) ×1 IMPLANT
GOWN STRL REUS W/TWL 2XL LVL3 (GOWN DISPOSABLE) ×3 IMPLANT
GOWN STRL REUS W/TWL LRG LVL3 (GOWN DISPOSABLE) ×9
GOWN STRL REUS W/TWL XL LVL3 (GOWN DISPOSABLE) ×3
HANDPIECE INTERPULSE COAX TIP (DISPOSABLE) ×3
IMMOBILIZER KNEE 22 UNIV (SOFTGOODS) ×2 IMPLANT
KIT BASIN OR (CUSTOM PROCEDURE TRAY) ×3 IMPLANT
KIT ROOM TURNOVER OR (KITS) ×3 IMPLANT
MANIFOLD NEPTUNE II (INSTRUMENTS) ×3 IMPLANT
MARKER SPHERE PSV REFLC THRD 5 (MARKER) ×12 IMPLANT
NDL HYPO 25GX1X1/2 BEV (NEEDLE) ×1 IMPLANT
NEEDLE 22X1 1/2 (OR ONLY) (NEEDLE) ×2 IMPLANT
NEEDLE HYPO 25GX1X1/2 BEV (NEEDLE) ×3 IMPLANT
NS IRRIG 1000ML POUR BTL (IV SOLUTION) ×3 IMPLANT
PACK TOTAL JOINT (CUSTOM PROCEDURE TRAY) ×3 IMPLANT
PACK UNIVERSAL I (CUSTOM PROCEDURE TRAY) ×3 IMPLANT
PAD ABD 8X10 STRL (GAUZE/BANDAGES/DRESSINGS) ×2 IMPLANT
PAD ARMBOARD 7.5X6 YLW CONV (MISCELLANEOUS) ×6 IMPLANT
PAD CAST 4YDX4 CTTN HI CHSV (CAST SUPPLIES) ×1 IMPLANT
PADDING CAST COTTON 4X4 STRL (CAST SUPPLIES) ×3
PADDING CAST COTTON 6X4 STRL (CAST SUPPLIES) ×6 IMPLANT
PIN SCHANZ 4MM 130MM (PIN) ×12 IMPLANT
SET HNDPC FAN SPRY TIP SCT (DISPOSABLE) ×1 IMPLANT
SPONGE GAUZE 4X4 12PLY STER LF (GAUZE/BANDAGES/DRESSINGS) ×4 IMPLANT
STAPLER VISISTAT 35W (STAPLE) IMPLANT
STRIP CLOSURE SKIN 1/2X4 (GAUZE/BANDAGES/DRESSINGS) ×1 IMPLANT
SUCTION FRAZIER HANDLE 10FR (MISCELLANEOUS) ×2
SUCTION TUBE FRAZIER 10FR DISP (MISCELLANEOUS) ×1 IMPLANT
SUT ETHILON 3 0 PS 1 (SUTURE) ×2 IMPLANT
SUT VIC AB 0 CT1 27 (SUTURE) ×3
SUT VIC AB 0 CT1 27XBRD ANBCTR (SUTURE) IMPLANT
SUT VIC AB 1 CTX 36 (SUTURE) ×9
SUT VIC AB 1 CTX36XBRD ANBCTR (SUTURE) ×2 IMPLANT
SUT VIC AB 2-0 CT1 27 (SUTURE) ×9
SUT VIC AB 2-0 CT1 TAPERPNT 27 (SUTURE) ×2 IMPLANT
SUT VIC AB 3-0 X1 27 (SUTURE) ×6 IMPLANT
SYR CONTROL 10ML LL (SYRINGE) ×3 IMPLANT
TOWEL OR 17X24 6PK STRL BLUE (TOWEL DISPOSABLE) ×3 IMPLANT
TOWEL OR 17X26 10 PK STRL BLUE (TOWEL DISPOSABLE) ×3 IMPLANT
WATER STERILE IRR 1000ML POUR (IV SOLUTION) ×3 IMPLANT

## 2015-06-10 NOTE — Interval H&P Note (Signed)
History and Physical Interval Note:  06/10/2015 12:33 PM  Tyler Greer  has presented today for surgery, with the diagnosis of Left Knee Degenerative Joint Disease  The various methods of treatment have been discussed with the patient and family. After consideration of risks, benefits and other options for treatment, the patient has consented to  Procedure(s): COMPUTER ASSISTED TOTAL KNEE ARTHROPLASTY (Left) as a surgical intervention .  The patient's history has been reviewed, patient examined, no change in status, stable for surgery.  I have reviewed the patient's chart and labs.  Questions were answered to the patient's satisfaction.     Warren Lindahl C

## 2015-06-10 NOTE — Progress Notes (Signed)
Orthopedic Tech Progress Note Patient Details:  Tyler Greer 1935/07/14 [id number] Applied CPM to LLE.  Applied OHF with trapeze to pt.'s bed.  Left Bone Foam with pt.'s nurse. CPM Left Knee CPM Left Knee: On Left Knee Flexion (Degrees): 90 Left Knee Extension (Degrees): 0   Darrol Poke 06/10/2015, 4:46 PM

## 2015-06-10 NOTE — H&P (Signed)
TOTAL KNEE ADMISSION H&P  Patient is being admitted for left total knee arthroplasty.  Subjective:  Chief Complaint:left knee pain.  HPI: Tyler Greer, 80 y.o. male, has a history of pain and functional disability in the left knee due to arthritis and has failed non-surgical conservative treatments for greater than 12 weeks to includeNSAID's and/or analgesics, corticosteriod injections, use of assistive devices, weight reduction as appropriate and activity modification.  Onset of symptoms was gradual, starting 10 years ago with gradually worsening course since that time. T  Patient currently rates pain in the left knee(s) at 10 out of 10 with activity. Patient has night pain, worsening of pain with activity and weight bearing, pain that interferes with activities of daily living, crepitus and joint swelling.  Patient has evidence of subchondral sclerosis, periarticular osteophytes and joint space narrowing by imaging studies. There is no active infection.  Patient Active Problem List   Diagnosis Date Noted  . Diabetic neuropathy ([place]) 08/28/2013  . Testosterone deficiency 07/27/2010  . Dyslipidemia 07/27/2010  . HEADACHE 11/10/2009  . CORNS AND CALLUSES 07/17/2008  . Type 2 diabetes mellitus without complication ([place]) 37/05/8887  . ACTION TREMOR 12/07/2006  . Osteoarthritis 12/07/2006  . PARESTHESIA 12/07/2006   Past Medical History  Diagnosis Date  . Osteoarthritis   . Diabetes mellitus type II 10/2007  . Intention tremor   . Paresthesia of foot     bilateral  . Stroke ([place]) 2005    mini stroke    Past Surgical History  Procedure Laterality Date  . Tonsillectomy    . Lumbar laminectomy      times 2  . Knee arthroscopy  2002    bilateral  . Hammer toe surgery      Prescriptions prior to admission  Medication Sig Dispense Refill Last Dose  . Glucosamine-Chondroitin (OSTEO BI-FLEX REGULAR STRENGTH PO) Take 1 tablet by mouth 2 (two) times daily.   Past Week at Unknown time   . metFORMIN (GLUCOPHAGE-XR) 500 MG 24 hr tablet TAKE TWO TABLETS EVERY DAY (Patient taking differently: Take 1 tablet by mouth twice a day) 180 tablet 3 06/09/2015 at Unknown time  . ACCU-CHEK COMPACT TEST DRUM test strip CHECK BLOOD GLOCOSE (SUGAR) DAILY (Patient not taking: Reported on 05/26/2015) 102 each 3 Taking  . Lancets Misc. (ACCU-CHEK MULTICLIX LANCET DEV) KIT AS DIRECTED (Patient not taking: Reported on 05/26/2015) 100 each 3 Taking  . sildenafil (VIAGRA) 100 MG tablet Take 0.5-1 tablets (50-100 mg total) by mouth as needed for erectile dysfunction. (Patient not taking: Reported on 05/26/2015) 5 tablet 2 Taking   No Known Allergies  Social History  Substance Use Topics  . Smoking status: Never Smoker   . Smokeless tobacco: Never Used  . Alcohol Use: No    Family History  Problem Relation Age of Onset  . Heart disease Father     died age 65-MI  . Diabetes type II Brother   . Melanoma Brother      Review of Systems  Constitutional: Negative.   HENT: Negative.   Eyes: Negative.   Respiratory: Negative.   Cardiovascular: Negative.   Gastrointestinal: Negative.   Genitourinary: Negative.   Musculoskeletal: Positive for joint pain.  Skin: Negative.   Psychiatric/Behavioral: Negative.     Objective:  Physical Exam  Constitutional: He is oriented to person, place, and time. No distress.  HENT:  Head: Atraumatic.  Eyes: EOM are normal.  Neck: Normal range of motion.  Cardiovascular: Normal rate.   Respiratory: No respiratory distress.  GI: He exhibits no distension.  Musculoskeletal: He exhibits tenderness.  Neurological: He is alert and oriented to person, place, and time.  Skin: Skin is warm and dry.  Psychiatric: He has a normal mood and affect.    Vital signs in last 24 hours: Temp:  [97.6 F (36.4 C)] 97.6 F (36.4 C) (04/19 1038) Pulse Rate:  [63] 63 (04/19 1038) Resp:  [20] 20 (04/19 1038) BP: (135)/(79) 135/79 mmHg (04/19 1038) SpO2:  [100 %] 100 % (04/19  1038) Weight:  [94.348 kg (208 lb)] 94.348 kg (208 lb) (04/19 1038)  Labs:   Estimated body mass index is 29.02 kg/(m^2) as calculated from the following:   Height as of 06/01/15: 5' 11"  (1.803 m).   Weight as of this encounter: 94.348 kg (208 lb).   Imaging Review Plain radiographs demonstrate moderate degenerative joint disease of the left knee(s). The overall alignment ismild varus. The bone quality appears to be good for age and reported activity level.  Assessment/Plan:  End stage arthritis, left knee   The patient history, physical examination, clinical judgment of the provider and imaging studies are consistent with end stage degenerative joint disease of the left knee(s) and total knee arthroplasty is deemed medically necessary. The treatment options including medical management, injection therapy arthroscopy and arthroplasty were discussed at length. The risks and benefits of total knee arthroplasty were presented and reviewed. The risks due to aseptic loosening, infection, stiffness, patella tracking problems, thromboembolic complications and other imponderables were discussed. The patient acknowledged the explanation, agreed to proceed with the plan and consent was signed. Patient is being admitted for inpatient treatment for surgery, pain control, PT, OT, prophylactic antibiotics, VTE prophylaxis, progressive ambulation and ADL's and discharge planning. The patient is planning to be discharged home with home health services

## 2015-06-10 NOTE — Op Note (Signed)
NAMEACESYN, MAIA              ACCOUNT NO.:  [id number]  MEDICAL RECORD NO.:  [id number]  LOCATION:  [facility]                         FACILITY:  [facility]  PHYSICIAN:  Mark C. Lorin Mercy, M.D.    DATE OF BIRTH:  1935-07-19  DATE OF PROCEDURE:  06/10/2015 DATE OF DISCHARGE:                              OPERATIVE REPORT   PREOPERATIVE DIAGNOSIS:  Left knee osteoarthritis.  POSTOPERATIVE DIAGNOSIS:  Left knee osteoarthritis.  PROCEDURE:  Left total knee arthroplasty.  DePuy L[employer] Sigma rotating platform.  COMPONENTS:  A 10-mm spacer, #5 cemented femur with lugs.  A #5 tibial tray, cemented.  A 38-mm 3-pegged poly.  SURGEON:  Mark C. Lorin Mercy, M.D.  ASSISTANT:  Alyson Locket. Ricard Dillon, PA-C, medically necessary and present for the entire procedure.  ANESTHESIA:  General plus preoperative adductor block plus Marcaine and Exparel 20+20.  TOURNIQUET TIME:  Less than an hour.  DESCRIPTION OF PROCEDURE:  After induction of general anesthesia, standard prepping and draping, time-out procedure, impervious stockinette, Coban, sterile skin marker, Betadine, Steri-Drape, wrapping with an Esmarch, tourniquet inflation and Ancef prophylaxis.  Midline incision was made.  Spurs were removed.  The patient had tricompartmental primary osteoarthritis involving all 3 compartments with large marginal spurs and erosive bone changes.  There was crystal formation suggestive of either CPPD or multiple cortisone injections. Meniscus remnants were resected.  Some portions of them were extremely hard, partially calcified suggesting CPPD.  Computer pins were placed in the tibia, stab incision mid tibia and inside the incision on the femur. The patient had 3 degrees lacking full extension and had 1.5 degrees valgus.  A 9 mm taken off the distal femur, 8 off the tibia.  Chamfer cuts were made, trial sizers for a #5, tibial keel preparation prepared. Patella was cut from facet to facet.  There was good position and alignment.   All components and balance was 21.5 mm medial and lateral flexion-extension gaps on the computer.  Knee reached full extension. Good collateral balance in flexion.  Trials removed, pulse lavage, cement mixed tibia and femur, placement of the polyethylene and then finally the patellar component was clamped and held in place until the cement was hard at 15 minutes.  Tourniquet was deflated.  Hemostasis standard layered closure.  A #1 Vicryl, 2-0 Vicryl in subcutaneous tissue, superficial retinaculum, subcuticular closure.  Exparel and Marcaine  have been infiltrated just prior to closure after hemostasis and dropping the tourniquet.  The patient tolerated the procedure well and transferred to recovery room in stable condition.     Mark C. Lorin Mercy, M.D.     MCY/MEDQ  D:  06/10/2015  T:  06/10/2015  Job:  [id number]

## 2015-06-10 NOTE — Progress Notes (Signed)
CRNA at bedside preparing to take pt back to OR. 

## 2015-06-10 NOTE — Brief Op Note (Signed)
06/10/2015  3:39 PM  PATIENT:  Tyler Greer  80 y.o. male  PRE-OPERATIVE DIAGNOSIS:  Left Knee Degenerative Joint Disease  POST-OPERATIVE DIAGNOSIS:  Left Knee Degenerative Joint Disease  PROCEDURE:  Procedure(s): COMPUTER ASSISTED TOTAL KNEE ARTHROPLASTY (Left)  SURGEON:  Surgeon(s) and Role:    Marybelle Killings, MD - Primary  PHYSICIAN ASSISTANT: Farheen Pfahler m. Ricard Dillon pa-c    ANESTHESIA:   regional and general  EBL:  Total I/O In: 1000 [I.V.:1000] Out: 100 [Blood:100]  BLOOD ADMINISTERED:none  DRAINS: none   LOCAL MEDICATIONS USED: marcaine/exparel  SPECIMEN:  No Specimen  DISPOSITION OF SPECIMEN:  N/A  COUNTS:  YES  TOURNIQUET:   Total Tourniquet Time Documented: Thigh (Left) - 59 minutes Total: Thigh (Left) - 59 minutes   DICTATION: .Viviann Spare Dictation  PLAN OF CARE: Admit to inpatient   PATIENT DISPOSITION:  PACU - hemodynamically stable.

## 2015-06-10 NOTE — Interval H&P Note (Signed)
History and Physical Interval Note:  06/10/2015 12:32 PM  Tyler Greer  has presented today for surgery, with the diagnosis of Left Knee Degenerative Joint Disease  The various methods of treatment have been discussed with the patient and family. After consideration of risks, benefits and other options for treatment, the patient has consented to  Procedure(s): COMPUTER ASSISTED TOTAL KNEE ARTHROPLASTY (Left) as a surgical intervention .  The patient's history has been reviewed, patient examined, no change in status, stable for surgery.  I have reviewed the patient's chart and labs.  Questions were answered to the patient's satisfaction.     [name]

## 2015-06-10 NOTE — Transfer of Care (Signed)
Immediate Anesthesia Transfer of Care Note  Patient: Tyler Greer  Procedure(s) Performed: Procedure(s): COMPUTER ASSISTED TOTAL KNEE ARTHROPLASTY (Left)  Patient Location: PACU  Anesthesia Type:GA combined with regional for post-op pain  Level of Consciousness: awake, alert  and patient cooperative  Airway & Oxygen Therapy: Patient Spontanous Breathing and Patient connected to nasal cannula oxygen  Post-op Assessment: Report given to RN and Post -op Vital signs reviewed and stable  Post vital signs: Reviewed and stable  Last Vitals:  Filed Vitals:   06/10/15 1245 06/10/15 1250  BP: 134/78 121/66  Pulse: 51 57  Temp:    Resp: 13 14    Complications: No apparent anesthesia complications

## 2015-06-10 NOTE — Anesthesia Procedure Notes (Addendum)
Procedure Name: LMA Insertion Date/Time: 06/10/2015 1:05 PM Performed by: Salli Quarry WEAVER Pre-anesthesia Checklist: Patient identified, Emergency Drugs available, Suction available and Patient being monitored Patient Re-evaluated:Patient Re-evaluated prior to inductionOxygen Delivery Method: Circle System Utilized Preoxygenation: Pre-oxygenation with 100% oxygen Intubation Type: IV induction LMA: LMA inserted LMA Size: 5.0 Number of attempts: 2 Placement Confirmation: positive ETCO2 Tube secured with: Tape Dental Injury: Teeth and Oropharynx as per pre-operative assessment    Anesthesia Regional Block:  Adductor canal block  Pre-Anesthetic Checklist: ,, timeout performed, Correct Patient, Correct Site, Correct Laterality, Correct Procedure, Correct Position, site marked, Risks and benefits discussed,  Surgical consent,  Pre-op evaluation,  At surgeon's request and post-op pain management  Laterality: Left  Prep: chloraprep       Needles:  Injection technique: Single-shot  Needle Type: Echogenic Stimulator Needle     Needle Length: 9cm 9 cm Needle Gauge: 21 and 21 G    Additional Needles:  Procedures: ultrasound guided (picture in chart) Adductor canal block Narrative:  Start time: 06/10/2015 12:30 PM End time: 06/10/2015 12:35 PM Injection made incrementally with aspirations every 5 mL.  Performed by: Personally   Additional Notes: 30 cc 0.5% Bupivacaine injected easily

## 2015-06-11 ENCOUNTER — Encounter (HOSPITAL_COMMUNITY): Payer: Self-pay | Admitting: Orthopaedic Surgery

## 2015-06-11 LAB — GLUCOSE, CAPILLARY
GLUCOSE-CAPILLARY: 155 mg/dL — AB (ref 65–99)
GLUCOSE-CAPILLARY: 157 mg/dL — AB (ref 65–99)
Glucose-Capillary: 117 mg/dL — ABNORMAL HIGH (ref 65–99)
Glucose-Capillary: 111 mg/dL — ABNORMAL HIGH (ref 65–99)

## 2015-06-11 LAB — CBC
HCT: 36 % — ABNORMAL LOW (ref 39.0–52.0)
HEMOGLOBIN: 11.7 g/dL — AB (ref 13.0–17.0)
MCH: 29.2 pg (ref 26.0–34.0)
MCHC: 32.5 g/dL (ref 30.0–36.0)
MCV: 89.8 fL (ref 78.0–100.0)
Platelets: 178 10*3/uL (ref 150–400)
RBC: 4.01 MIL/uL — ABNORMAL LOW (ref 4.22–5.81)
RDW: 14 % (ref 11.5–15.5)
WBC: 8.5 10*3/uL (ref 4.0–10.5)

## 2015-06-11 LAB — BASIC METABOLIC PANEL
Anion gap: 11 (ref 5–15)
BUN: 15 mg/dL (ref 6–20)
CHLORIDE: 104 mmol/L (ref 101–111)
CO2: 25 mmol/L (ref 22–32)
CREATININE: 1.11 mg/dL (ref 0.61–1.24)
Calcium: 8.4 mg/dL — ABNORMAL LOW (ref 8.9–10.3)
GFR calc Af Amer: >60 mL/min (ref >60)
GFR calc non Af Amer: >60 mL/min (ref >60)
Glucose, Bld: 144 mg/dL — ABNORMAL HIGH (ref 65–99)
Potassium: 3.9 mmol/L (ref 3.5–5.1)
SODIUM: 140 mmol/L (ref 135–145)

## 2015-06-11 MED ORDER — MECLIZINE HCL 12.5 MG PO TABS
12.5000 mg | ORAL_TABLET | Freq: Two times a day (BID) | ORAL | Status: DC | PRN
Start: 2015-06-11 — End: 2015-06-12
  Administered 2015-06-11: 12.5 mg via ORAL
  Filled 2015-06-11 (×2): qty 1

## 2015-06-11 NOTE — Care Management Note (Signed)
Case Management Note  Patient Details  Name: ANGELES KOELLNER MRN: [id number] Date of Birth: [birth year]  Subjective/Objective:           S/p left total knee arthroplasty         Action/Plan: Spoke with patient and his friend Dwana Melena, about discharge plan. He states that he will be staying with Ms. Landry Mellow at [address] [place], [phone]. Her home is handicap equipped and Ms Landry Mellow will be able to assist him after discharge. Gave Mr. Knuckles choice for home health, he selected [facility]. Contacted Yanci Bachtell at [facility] and set up [place] and [place]. Contacted James at [facility] and requested rolling walker be delivered to patient's room, patient does not want a 3N1. Will continue to follow for discharge needs.        Expected Discharge Date:                  Expected Discharge Plan:  [place]  In-House Referral:  Clinical Social Work  Discharge planning Services  CM Consult  Post Acute Care Choice:  Home Health, Durable Medical Equipment Choice offered to:  Patient  DME Arranged:  Walker rolling DME Agency:  [facility] Arranged:  PT, OT HH Agency:  [facility]  Status of Service:  In process, will continue to follow  Medicare Important Message Given:    Date Medicare IM Given:    Medicare IM give by:    Date Additional Medicare IM Given:    Additional Medicare Important Message give by:     If discussed at [place] of Stay Meetings, dates discussed:    Additional Comments:  Nila Nephew, RN 06/11/2015, 3:40 PM

## 2015-06-11 NOTE — Progress Notes (Signed)
Orthopedic Tech Progress Note Patient Details:  Tyler Greer 1935/09/13 [id number]  Patient ID: Tyler Greer, male   DOB: Dec 25, 1935, 80 y.o.   MRN: [id number] Applied cpm 0-60  Karolee Stamps 06/11/2015, 5:50 AM

## 2015-06-11 NOTE — Progress Notes (Signed)
Physical Therapy Treatment Patient Details Name: Tyler Greer MRN: [id number] DOB: 1935/03/05 Today's Date: 06/11/2015    History of Present Illness 80 y.o. male with h/o DM, diabetic neuropathy, vertigo admitted for L TKA.     PT Comments    Pt progressing well with mobility, he ambulated 42' with RW without loss of balance. Performed L TKA exercises with min assist. Will plan to do stair training tomorrow.   Follow Up Recommendations  Home health PT     Equipment Recommendations  Rolling walker with 5" wheels;3in1 (PT)    Recommendations for Other Services       Precautions / Restrictions Precautions Precautions: Knee;Fall Restrictions Weight Bearing Restrictions: Yes LLE Weight Bearing: Weight bearing as tolerated    Mobility  Bed Mobility Overal bed mobility: Needs Assistance Bed Mobility: Sidelying to Sit;Sit to Supine       Sit to supine: Min assist   General bed mobility comments: min A for LLE  Transfers Overall transfer level: Needs assistance Equipment used: Rolling walker (2 wheeled) Transfers: Sit to/from Stand Sit to Stand: Min assist         General transfer comment: verbal cues for hand placement and LLE positioning, assist to rise and to control descent  Ambulation/Gait Ambulation/Gait assistance: Min guard Ambulation Distance (Feet): 90 Feet Assistive device: Rolling walker (2 wheeled) Gait Pattern/deviations: Step-through pattern;Decreased step length - right;Decreased step length - left   Gait velocity interpretation: Below normal speed for age/gender General Gait Details: verbal cues to lift head and increase step length, distance limited by pain/fatigue   Stairs            Wheelchair Mobility    Modified Rankin (Stroke Patients Only)       Balance Overall balance assessment: Needs assistance Sitting-balance support: Feet supported;No upper extremity supported Sitting balance-Leahy Scale: Good     Standing  balance support: Bilateral upper extremity supported Standing balance-Leahy Scale: Poor Standing balance comment: RW for support                    Cognition Arousal/Alertness: Awake/alert Behavior During Therapy: WFL for tasks assessed/performed Overall Cognitive Status: Within Functional Limits for tasks assessed                      Exercises Total Joint Exercises Ankle Circles/Pumps: AROM;Both;10 reps;Supine Quad Sets: AROM;Both;10 reps;Supine Short Arc Quad: AROM;Left;10 reps;Supine Heel Slides: AAROM;Left;10 reps;Supine Straight Leg Raises: AAROM;Left;5 reps;Supine Goniometric ROM: 0-35* AAROM L knee    General Comments        Pertinent Vitals/Pain Pain Assessment: 0-10 Pain Score: 7  Pain Location: L knee and thigh Pain Descriptors / Indicators: Sore Pain Intervention(s): Premedicated before session;Monitored during session;Limited activity within patient's tolerance;Ice applied    Home Living Family/patient expects to be discharged to:: Private residence Living Arrangements: Alone Available Help at Discharge: Neighbor;Available 24 hours/day Type of Home: House Home Access: Stairs to enter   Home Layout: One level Home Equipment: None Additional Comments: pt lives alone, but plans to DC to neighbor's home    Prior Function Level of Independence: Independent          PT Goals (current goals can now be found in the care plan section) Acute Rehab PT Goals Patient Stated Goal: return to being independent PT Goal Formulation: With patient/family Time For Goal Achievement: 06/25/15 Potential to Achieve Goals: Good Progress towards PT goals: Progressing toward goals    Frequency  BID    PT  Plan Current plan remains appropriate    Co-evaluation             End of Session Equipment Utilized During Treatment: Gait belt Activity Tolerance: Patient tolerated treatment well (vertigo with nausea, RN notified) Patient left: with call  bell/phone within reach;in bed;with family/visitor present     Time: 1347-1410 PT Time Calculation (min) (ACUTE ONLY): 23 min  Charges:  $Gait Training: 8-22 mins $Therapeutic Exercise: 8-22 mins                    G Codes:      Philomena Doheny 06/11/2015, 2:14 PM [phone]

## 2015-06-11 NOTE — Progress Notes (Signed)
Utilization review completed.  

## 2015-06-11 NOTE — Evaluation (Signed)
Occupational Therapy Evaluation Patient Details Name: Tyler Greer MRN: [id number] DOB: [birth year] Today's Date: 06/11/2015    History of Present Illness 80 y.o. male with h/o DM, diabetic neuropathy, vertigo admitted for L TKA.    Clinical Impression   Pt reports he was independent with ADLs PTA. Pt currently overall min guard for safety with functional mobility and ADLs with the exception of mod assist for LB ADLs. Began safety and ADL education with pt. Pt planning to d/c to neighbors house where he can receive 24/7 supervision. Recommending HHOT for follow up in order to maximize independence and safety with ADLs and functional mobility. Pt would benefit from continued skilled OT to address established goals.    Follow Up Recommendations  Home health OT;Supervision/Assistance - 24 hour    Equipment Recommendations  3 in 1 bedside comode;Other (comment) (AE?)    Recommendations for Other Services       Precautions / Restrictions Precautions Precautions: Knee;Fall Restrictions Weight Bearing Restrictions: Yes LLE Weight Bearing: Weight bearing as tolerated Other Position/Activity Restrictions: WBAT LLE      Mobility Bed Mobility               General bed mobility comments: Pt OOB in chair upon arrival.  Transfers Overall transfer level: Needs assistance Equipment used: Rolling walker (2 wheeled) Transfers: Sit to/from Stand Sit to Stand: Min guard         General transfer comment: Min guard for safety; no physical assist required. Increased time needed. VCs for hand placement and technique.    Balance Overall balance assessment: Needs assistance Sitting-balance support: Feet supported;No upper extremity supported Sitting balance-Leahy Scale: Good     Standing balance support: Bilateral upper extremity supported Standing balance-Leahy Scale: Poor Standing balance comment: RW for support                            ADL Overall ADL's :  Needs assistance/impaired Eating/Feeding: Set up;Sitting   Grooming: Min guard;Standing   Upper Body Bathing: Supervision/ safety;Sitting;Set up   Lower Body Bathing: Moderate assistance;Sit to/from stand   Upper Body Dressing : Supervision/safety;Set up;Sitting   Lower Body Dressing: Moderate assistance;Sit to/from stand Lower Body Dressing Details (indicate cue type and reason): Pt unable to reach bil feet at this time.  Toilet Transfer: Min guard;Ambulation;BSC;RW (BSC over toilet) Toilet Transfer Details (indicate cue type and reason): Simulated sit to stand from chair Toileting- Clothing Manipulation and Hygiene: Min guard;Sit to/from stand       Functional mobility during ADLs: Min guard;Rolling walker General ADL Comments: Educated pt on energy conservation and home safety.      Vision     Perception     Praxis      Pertinent Vitals/Pain Pain Assessment: 0-10 Pain Score: 10-Worst pain ever Pain Location: L knee +dizziness Pain Descriptors / Indicators: Aching;Sore Pain Intervention(s): Limited activity within patient's tolerance;Monitored during session;Premedicated before session;Ice applied     Hand Dominance     Extremity/Trunk Assessment Upper Extremity Assessment Upper Extremity Assessment: Overall WFL for tasks assessed   Lower Extremity Assessment Lower Extremity Assessment: Defer to PT evaluation    Cervical / Trunk Assessment Cervical / Trunk Assessment: Kyphotic   Communication Communication Communication: HOH   Cognition Arousal/Alertness: Awake/alert Behavior During Therapy: WFL for tasks assessed/performed Overall Cognitive Status: Within Functional Limits for tasks assessed  General Comments       Exercises       Shoulder Instructions      Home Living Family/patient expects to be discharged to:: Private residence Living Arrangements: Alone Available Help at Discharge: Neighbor;Available 24  hours/day Type of Home: House Home Access: Stairs to enter [employer] of Steps: 1   Home Layout: One level     Bathroom Shower/Tub: Tub/shower unit Shower/tub characteristics: Architectural technologist: Standard     Home Equipment: None   Additional Comments: pt lives alone, but plans to DC to neighbor's home      Prior Functioning/Environment Level of Independence: Independent             OT Diagnosis: Generalized weakness;Acute pain   OT Problem List: Decreased strength;Decreased activity tolerance;Impaired balance (sitting and/or standing);Decreased knowledge of use of DME or AE;Decreased knowledge of precautions;Decreased safety awareness;Pain   OT Treatment/Interventions: Self-care/ADL training;Energy conservation;DME and/or AE instruction;Patient/family education;Balance training    OT Goals(Current goals can be found in the care plan section) Acute Rehab OT Goals Patient Stated Goal: return to being independent OT Goal Formulation: With patient Time For Goal Achievement: 06/25/15 Potential to Achieve Goals: Good ADL Goals Pt Will Perform Grooming: with supervision;standing Pt Will Perform Lower Body Bathing: with supervision;sit to/from stand (with or without AE) Pt Will Perform Lower Body Dressing: with supervision;sit to/from stand (with or without AE) Pt Will Transfer to Toilet: with supervision;ambulating;bedside commode (BSC over toilet) Pt Will Perform Toileting - Clothing Manipulation and hygiene: with supervision;sit to/from stand Pt Will Perform Tub/Shower Transfer: Tub transfer;with supervision;ambulating;3 in 1;rolling walker  OT Frequency: Min 2X/week   Barriers to D/C: Other (comment) (pt lives alone; plan to d/c to neighbors house)          Co-evaluation              End of Session Equipment Utilized During Treatment: Gait belt;Rolling walker Nurse Communication: Mobility status;Other (comment) (IV beeping)  Activity  Tolerance: Patient tolerated treatment well Patient left: in chair;with call bell/phone within reach;with chair alarm set   Time: 1144-1200 OT Time Calculation (min): 16 min Charges:  OT General Charges $OT Visit: 1 Procedure OT Evaluation $OT Eval Moderate Complexity: 1 Procedure G-Codes:     Binnie Kand M.S., OTR/L Pager: [phone]  06/11/2015, 1:37 PM

## 2015-06-11 NOTE — Progress Notes (Signed)
Subjective: Doing well. Pain controlled.  Lives alone.    Objective: Vital signs in last 24 hours: Temp:  [97.4 F (36.3 C)-98.5 F (36.9 C)] 98.5 F (36.9 C) (04/20 0600) Pulse Rate:  [51-97] 88 (04/20 0600) Resp:  [11-20] 19 (04/20 0600) BP: (121-144)/(66-84) 121/70 mmHg (04/20 0600) SpO2:  [97 %-100 %] 98 % (04/20 0600) Weight:  [94.348 kg (208 lb)] 94.348 kg (208 lb) (04/19 1038)  Intake/Output from previous day: 04/19 0701 - 04/20 0700 In: 1000 [I.V.:1000] Out: 1100 [Urine:1000; Blood:100] Intake/Output this shift:     Recent Labs  06/11/15 0516  HGB 11.7*    Recent Labs  06/11/15 0516  WBC 8.5  RBC 4.01*  HCT 36.0*  PLT 178    Recent Labs  06/11/15 0516  NA 140  K 3.9  CL 104  CO2 25  BUN 15  CREATININE 1.11  GLUCOSE 144*  CALCIUM 8.4*   No results for input(s): LABPT, INR in the last 72 hours.  Exam: alert and oriented.  Dressing c/d/i.    Assessment/Plan: Start PT today. States that he may have a friend in his neighborhood that will assist if he discharges home. Will see how he does. We did discuss possible short snf placement.  He will talk to social work.    OWENS,JAMES M 06/11/2015, 10:23 AM

## 2015-06-11 NOTE — Evaluation (Signed)
Physical Therapy Evaluation Patient Details Name: Tyler Greer MRN: [id number] DOB: 1935-05-08 Today's Date: 06/11/2015   History of Present Illness  79 y.o. male with h/o DM, diabetic neuropathy, vertigo admitted for L TKA.   Clinical Impression  Pt is s/p TKA resulting in the deficits listed below (see PT Problem List). Pt ambulated 30' with RW and min/guard assistance. Distance limited by vertigo and nausea, pt reports 30 year h/o vertigo which responds well to Dramamine. Requested order from RN. Initiated L TKA exercises. Good progress expected once vertigo resolves. Pt plans to DC to a neighbor's home.  Pt will benefit from skilled PT to increase their independence and safety with mobility to allow discharge to the venue listed below.      Follow Up Recommendations Home health PT    Equipment Recommendations  Rolling walker with 5" wheels;3in1 (PT)    Recommendations for Other Services       Precautions / Restrictions Precautions Precautions: Knee Restrictions Weight Bearing Restrictions: No Other Position/Activity Restrictions: WBAT LLE      Mobility  Bed Mobility               General bed mobility comments: NT-up in recliner  Transfers Overall transfer level: Needs assistance Equipment used: Rolling walker (2 wheeled) Transfers: Sit to/from Stand Sit to Stand: Mod assist         General transfer comment: verbal cues for hand placement and LLE positioning, assist to rise and to control descent  Ambulation/Gait Ambulation/Gait assistance: Min guard Ambulation Distance (Feet): 40 Feet Assistive device: Rolling walker (2 wheeled) Gait Pattern/deviations: Step-to pattern;Decreased step length - right;Decreased step length - left;Decreased stance time - left   Gait velocity interpretation: Below normal speed for age/gender General Gait Details: verbal cues to lift head, distance limited by vertigo, spoke to RN and requested order for meclazine (pt  reports dramamine controls his 30 year h/o vertigo well)  Stairs            Wheelchair Mobility    Modified Rankin (Stroke Patients Only)       Balance Overall balance assessment: Needs assistance   Sitting balance-Leahy Scale: Good     Standing balance support: Bilateral upper extremity supported Standing balance-Leahy Scale: Poor                               Pertinent Vitals/Pain Pain Assessment: 0-10 Pain Score: 7  Pain Location: L knee and thigh Pain Descriptors / Indicators: Sore Pain Intervention(s): Limited activity within patient's tolerance;Monitored during session;Premedicated before session;Ice applied    Home Living Family/patient expects to be discharged to:: Private residence Living Arrangements: Alone Available Help at Discharge: Neighbor;Available 24 hours/day Type of Home: House Home Access: Stairs to enter   [employer] of Steps: 1 Home Layout: One level Home Equipment: None Additional Comments: pt lives alone, but plans to DC to neighbor's home    Prior Function Level of Independence: Independent               Hand Dominance        Extremity/Trunk Assessment   Upper Extremity Assessment: Defer to OT evaluation           Lower Extremity Assessment: LLE deficits/detail   LLE Deficits / Details: 0-25* AAROM L knee, limited by pain, 2/5 SLR, ankle WNL, sensation intact to light touch  Cervical / Trunk Assessment: Kyphotic (forward head)  Communication   Communication: [facility]  Cognition Arousal/Alertness: Awake/alert Behavior During Therapy: WFL for tasks assessed/performed Overall Cognitive Status: Within Functional Limits for tasks assessed                      General Comments      Exercises Total Joint Exercises Ankle Circles/Pumps: AROM;Both;10 reps;Supine Quad Sets: AROM;Both;10 reps;Supine Heel Slides: AAROM;Left;10 reps;Supine Goniometric ROM: 0-25* AAROM L knee, limited by  pain      Assessment/Plan    PT Assessment Patient needs continued PT services  PT Diagnosis Difficulty walking;Acute pain   PT Problem List Decreased strength;Decreased range of motion;Decreased activity tolerance;Decreased balance;Pain;Decreased knowledge of use of DME;Decreased mobility  PT Treatment Interventions DME instruction;Gait training;Stair training;Functional mobility training;Therapeutic activities;Patient/family education;Therapeutic exercise   PT Goals (Current goals can be found in the Care Plan section) Acute Rehab PT Goals Patient Stated Goal: to be able to walk  PT Goal Formulation: With patient Time For Goal Achievement: 06/25/15 Potential to Achieve Goals: Good    Frequency BID   Barriers to discharge        Co-evaluation               End of Session Equipment Utilized During Treatment: Gait belt Activity Tolerance: Treatment limited secondary to medical complications (Comment) (vertigo with nausea, RN notified) Patient left: in chair;with call bell/phone within reach;with chair alarm set Nurse Communication: Mobility status         Time: [id number] PT Time Calculation (min) (ACUTE ONLY): 24 min   Charges:   PT Evaluation $PT Eval Low Complexity: 1 Procedure PT Treatments $Gait Training: 8-22 mins   PT G Codes:        Philomena Doheny 06/11/2015, 9:52 AM [phone]

## 2015-06-12 LAB — CBC
HEMATOCRIT: 34.7 % — AB (ref 39.0–52.0)
Hemoglobin: 11.1 g/dL — ABNORMAL LOW (ref 13.0–17.0)
MCH: 29.1 pg (ref 26.0–34.0)
MCHC: 32 g/dL (ref 30.0–36.0)
MCV: 90.8 fL (ref 78.0–100.0)
Platelets: 164 10*3/uL (ref 150–400)
RBC: 3.82 MIL/uL — AB (ref 4.22–5.81)
RDW: 14.3 % (ref 11.5–15.5)
WBC: 7.8 10*3/uL (ref 4.0–10.5)

## 2015-06-12 LAB — GLUCOSE, CAPILLARY: GLUCOSE-CAPILLARY: 103 mg/dL — AB (ref 65–99)

## 2015-06-12 MED ORDER — METAXALONE 800 MG PO TABS: 800.0000 mg | ORAL_TABLET | Freq: Three times a day (TID) | ORAL | Status: DC

## 2015-06-12 MED ORDER — ASPIRIN 325 MG PO TABS: 325.0000 mg | ORAL_TABLET | Freq: Every day | ORAL | Status: DC

## 2015-06-12 MED ORDER — OXYCODONE-ACETAMINOPHEN 5-325 MG PO TABS: 1.0000 | ORAL_TABLET | ORAL | Status: DC | PRN

## 2015-06-12 NOTE — Clinical Social Work Note (Signed)
CSW received referral for SNF.  Case discussed with case manager, and plan is to discharge home with home health.  CSW to sign off please re-consult if social work needs arise.  Zowie Lundahl R. Eda Magnussen, MSW, LCSWA [phone]  

## 2015-06-12 NOTE — Progress Notes (Signed)
Pt ready for discharge. Education/instructions reviewed and all questions/concerns addressed. IV removed and belongings gathered. Pt will be transported out via wheelchair to neighbor's car. Will continue to monitor.

## 2015-06-12 NOTE — Discharge Instructions (Addendum)
INSTRUCTIONS AFTER TOTAL KNEE JOINT REPLACEMENT  ° °o Remove items at home which could result in a fall. This includes throw rugs or furniture in walking pathways °o ICE to the affected joint every three hours while awake for 30 minutes at a time, for at least the first 3-5 days, and then as needed for pain and swelling.  Continue to use ice for pain and swelling. You may notice swelling that will progress down to the foot and ankle.  This is normal after surgery.  Elevate your leg when you are not up walking on it.   °o Continue to use the breathing machine you got in the hospital (incentive spirometer) which will help keep your temperature down.  It is common for your temperature to cycle up and down following surgery, especially at night when you are not up moving around and exerting yourself.  The breathing machine keeps your lungs expanded and your temperature down. ° ° °DIET:  As you were doing prior to hospitalization, we recommend a well-balanced diet. ° °DRESSING / WOUND CARE / SHOWERING ° °You may change your dressing 3-5 days after surgery.  Then change the dressing every day with sterile gauze.  Please use good hand washing techniques before changing the dressing.  Do not use any lotions or creams on the incision until instructed by your surgeon. ° °ACTIVITY ° °o Increase activity slowly as tolerated, but follow the weight bearing instructions below.   °o No driving for 6 weeks or until further direction given by your physician.  You cannot drive while taking narcotics.  °o No lifting or carrying greater than 10 lbs. until further directed by your surgeon. °o Avoid periods of inactivity such as sitting longer than an hour when not asleep. This helps prevent blood clots.  °o You may return to work once you are authorized by your doctor.  ° ° ° °WEIGHT BEARING  ° °Weight bearing as tolerated with assist device (walker, cane, etc) as directed, use it as long as suggested by your surgeon or therapist,  typically at least 4-6 weeks. ° ° °EXERCISES ° °Results after joint replacement surgery are often greatly improved when you follow the exercise, range of motion and muscle strengthening exercises prescribed by your doctor. Safety measures are also important to protect the joint from further injury. Any time any of these exercises cause you to have increased pain or swelling, decrease what you are doing until you are comfortable again and then slowly increase them. If you have problems or questions, call your caregiver or physical therapist for advice.  ° °Rehabilitation is important following a joint replacement. After just a few days of immobilization, the muscles of the leg can become weakened and shrink (atrophy).  These exercises are designed to build up the tone and strength of the thigh and leg muscles and to improve motion. Often times heat used for twenty to thirty minutes before working out will loosen up your tissues and help with improving the range of motion but do not use heat for the first two weeks following surgery (sometimes heat can increase post-operative swelling).  ° °These exercises can be done on a training (exercise) mat, on the floor, on a table or on a bed. Use whatever works the best and is most comfortable for you.    Use music or television while you are exercising so that the exercises are a pleasant break in your day. This will make your life better with the exercises acting as   a break in your routine that you can look forward to.   Perform all exercises about fifteen times, three times per day or as directed.  You should exercise both the operative leg and the other leg as well.  Exercises include:    Quad Sets - Tighten up the muscle on the front of the thigh (Quad) and hold for 5-10 seconds.    Straight Leg Raises - With your knee straight (if you were given a brace, keep it on), lift the leg to 60 degrees, hold for 3 seconds, and slowly lower the leg.  Perform this exercise  against resistance later as your leg gets stronger.   Leg Slides: Lying on your back, slowly slide your foot toward your buttocks, bending your knee up off the floor (only go as far as is comfortable). Then slowly slide your foot back down until your leg is flat on the floor again.   Angel Wings: Lying on your back spread your legs to the side as far apart as you can without causing discomfort.   Hamstring Strength:  Lying on your back, push your heel against the floor with your leg straight by tightening up the muscles of your buttocks.  Repeat, but this time bend your knee to a comfortable angle, and push your heel against the floor.  You may put a pillow under the heel to make it more comfortable if necessary.   A rehabilitation program following joint replacement surgery can speed recovery and prevent re-injury in the future due to weakened muscles. Contact your doctor or a physical therapist for more information on knee rehabilitation.    CONSTIPATION  Constipation is defined medically as fewer than three stools per week and severe constipation as less than one stool per week.  Even if you have a regular bowel pattern at home, your normal regimen is likely to be disrupted due to multiple reasons following surgery.  Combination of anesthesia, postoperative narcotics, change in appetite and fluid intake all can affect your bowels.   YOU MUST use at least one of the following options; they are listed in order of increasing strength to get the job done.  They are all available over the counter, and you may need to use some, POSSIBLY even all of these options:    Drink plenty of fluids (prune juice may be helpful) and high fiber foods Colace 100 mg by mouth twice a day  Senokot for constipation as directed and as needed Dulcolax (bisacodyl), take with full glass of water  Miralax (polyethylene glycol) once or twice a day as needed.  If you have tried all these things and are unable to have a  bowel movement in the first 3-4 days after surgery call either your surgeon or your primary doctor.    If you experience loose stools or diarrhea, hold the medications until you stool forms back up.  If your symptoms do not get better within 1 week or if they get worse, check with your doctor.  If you experience "the worst abdominal pain ever" or develop nausea or vomiting, please contact the office immediately for further recommendations for treatment.   ITCHING:  If you experience itching with your medications, try taking only a single pain pill, or even half a pain pill at a time.  You can also use Benadryl over the counter for itching or also to help with sleep.   TED HOSE STOCKINGS:  Use stockings on both legs until for at least 2 weeks  or as directed by physician office. They may be removed at night for sleeping. ° °MEDICATIONS:  See your medication summary on the “After Visit Summary” that nursing will review with you.  You may have some home medications which will be placed on hold until you complete the course of blood thinner medication.  It is important for you to complete the blood thinner medication as prescribed. ° °PRECAUTIONS:  If you experience chest pain or shortness of breath - call 911 immediately for transfer to the hospital emergency department.  ° °If you develop a fever greater that 101 F, purulent drainage from wound, increased redness or drainage from wound, foul odor from the wound/dressing, or calf pain - CONTACT YOUR SURGEON.   °                                                °FOLLOW-UP APPOINTMENTS:  If you do not already have a post-op appointment, please call the office for an appointment to be seen by your surgeon.  Guidelines for how soon to be seen are listed in your “After Visit Summary”, but are typically between 1-4 weeks after surgery. ° °OTHER INSTRUCTIONS:  ° °Knee Replacement:  Do not place pillow under knee, focus on keeping the knee straight while resting. CPM  instructions: 0-90 degrees, 2 hours in the morning, 2 hours in the afternoon, and 2 hours in the evening. Place foam block, curve side up under heel at all times except when in CPM or when walking.  DO NOT modify, tear, cut, or change the foam block in any way. ° °MAKE SURE YOU:  °• Understand these instructions.  °• Get help right away if you are not doing well or get worse.  ° ° °Thank you for letting us be a part of your medical care team.  It is a privilege we respect greatly.  We hope these instructions will help you stay on track for a fast and full recovery!  ° °

## 2015-06-12 NOTE — Discharge Summary (Signed)
Patient ID: Tyler Greer MRN: [id number] DOB/AGE: March 11, 1935 80 y.o.  Admit date: 06/10/2015 Discharge date: 06/12/2015  Admission Diagnoses:  Active Problems:   Status post total left knee replacement   Discharge Diagnoses:  Active Problems:   Status post total left knee replacement  status post Procedure(s): COMPUTER ASSISTED TOTAL KNEE ARTHROPLASTY  Past Medical History  Diagnosis Date  . Intention tremor   . Paresthesia of foot     bilateral  . Stroke ([place]) 2005    mini stroke  . Diabetes mellitus type II 10/2007  . Migraine     "none in years" (06/10/2015)  . Osteoarthritis   . Arthritis     "hands and knees" (06/10/2015)  . Basal cell carcinoma, arm     "both"  . Basal cell carcinoma, face     Surgeries: Procedure(s): COMPUTER ASSISTED TOTAL KNEE ARTHROPLASTY on 06/10/2015   Consultants:    Discharged Condition: Improved  Hospital Course: Tyler Greer is an 80 y.o. male who was admitted 06/10/2015 for operative treatment of knee djd. Patient failed conservative treatments (please see the history and physical for the specifics) and had severe unremitting pain that affects sleep, daily activities and work/hobbies. After pre-op clearance, the patient was taken to the operating room on 06/10/2015 and underwent  Procedure(s): COMPUTER ASSISTED TOTAL KNEE ARTHROPLASTY.    Patient was given perioperative antibiotics: Anti-infectives    Start     Dose/Rate Route Frequency Ordered Stop   06/10/15 2000  ceFAZolin (ANCEF) IVPB 1 g/50 mL premix     1 g 100 mL/hr over 30 Minutes Intravenous Every 8 hours 06/10/15 1729 06/11/15 0707   06/10/15 0600  ceFAZolin (ANCEF) IVPB 2g/100 mL premix     2 g 200 mL/hr over 30 Minutes Intravenous To ShortStay Surgical 06/09/15 1252 06/10/15 1308       Patient was given sequential compression devices and early ambulation to prevent DVT.   Patient benefited maximally from hospital stay and there were no complications. At the  time of discharge, the patient was urinating/moving their bowels without difficulty, tolerating a regular diet, pain is controlled with oral pain medications and they have been cleared by PT/OT.   Recent vital signs: Patient Vitals for the past 24 hrs:  BP Temp Pulse Resp SpO2  06/12/15 0513 129/74 mmHg 98.4 F (36.9 C) (!) 101 18 95 %  06/11/15 2005 128/66 mmHg 100.3 F (37.9 C) (!) 103 18 96 %  06/11/15 1444 112/64 mmHg 99.9 F (37.7 C) (!) 108 18 94 %     Recent laboratory studies:  Recent Labs  06/11/15 0516 06/12/15 0422  WBC 8.5 7.8  HGB 11.7* 11.1*  HCT 36.0* 34.7*  PLT 178 164  NA 140  --   K 3.9  --   CL 104  --   CO2 25  --   BUN 15  --   CREATININE 1.11  --   GLUCOSE 144*  --   CALCIUM 8.4*  --      Discharge Medications:     Medication List    TAKE these medications        ACCU-CHEK COMPACT TEST DRUM test strip  Generic drug:  glucose blood  CHECK BLOOD GLOCOSE (SUGAR) DAILY     ACCU-CHEK MULTICLIX LANCET DEV Kit  AS DIRECTED     aspirin 325 MG tablet  Commonly known as:  BAYER ASPIRIN  Take 1 tablet (325 mg total) by mouth daily.     metaxalone  800 MG tablet  Commonly known as:  SKELAXIN  Take 1 tablet (800 mg total) by mouth 3 (three) times daily.     metFORMIN 500 MG 24 hr tablet  Commonly known as:  GLUCOPHAGE-XR  TAKE TWO TABLETS EVERY DAY     OSTEO BI-FLEX REGULAR STRENGTH PO  Take 1 tablet by mouth 2 (two) times daily.     oxyCODONE-acetaminophen 5-325 MG tablet  Commonly known as:  ROXICET  Take 1-2 tablets by mouth every 4 (four) hours as needed for severe pain.     sildenafil 100 MG tablet  Commonly known as:  VIAGRA  Take 0.5-1 tablets (50-100 mg total) by mouth as needed for erectile dysfunction.        Diagnostic Studies: Dg Chest 2 View  06/01/2015  CLINICAL DATA:  Preop for total knee arthroplasty. EXAM: CHEST  2 VIEW COMPARISON:  None. FINDINGS: Mild interstitial coarsening but no hyperinflation or emphysematous  change. There is no edema, consolidation, effusion, or pneumothorax. Normal heart size and negative mediastinal contours. Thoracic spondylosis. IMPRESSION: No active cardiopulmonary disease. Electronically Signed   By: Tyler Greer M.D.   On: 06/01/2015 09:51   Dg Knee Left Port  06/10/2015  CLINICAL DATA:  Status post total knee arthroplasty. EXAM: PORTABLE LEFT KNEE - 1-2 VIEW COMPARISON:  None. FINDINGS: The femoral and tibial components are well seated without complicating features. Expected postoperative changes involving the patella. IMPRESSION: Well seated components of a total left knee arthroplasty. No complicating features. Electronically Signed   By: Tyler Greer M.D.   On: 06/10/2015 16:19          Follow-up Information    Follow up with [facility].   Why:  They will contact you to schedule home therapy visits.   Contact information:   [address] [place] [address] [phone]       Follow up with Marybelle Killings, MD In 2 weeks.   Specialty:  Orthopedic Surgery   Contact information:   [place] [address] [phone]       Discharge Plan:  discharge to HOME  Disposition:     Signed: Lanae Greer for Dr Rodell Perna  06/12/2015, 8:18 AM

## 2015-06-12 NOTE — Progress Notes (Addendum)
Subjective: 2 Days Post-Op Procedure(s) (LRB): COMPUTER ASSISTED TOTAL KNEE ARTHROPLASTY (Left) Patient reports pain as mild and moderate.    Objective: Vital signs in last 24 hours: Temp:  [98.4 F (36.9 C)-100.3 F (37.9 C)] 98.4 F (36.9 C) (04/21 0513) Pulse Rate:  [101-108] 101 (04/21 0513) Resp:  [18] 18 (04/21 0513) BP: (112-129)/(64-74) 129/74 mmHg (04/21 0513) SpO2:  [94 %-96 %] 95 % (04/21 0513)  Intake/Output from previous day: 04/20 0701 - 04/21 0700 In: 720 [P.O.:720] Out: 650 [Urine:650] Intake/Output this shift:     Recent Labs  06/11/15 0516 06/12/15 0422  HGB 11.7* 11.1*    Recent Labs  06/11/15 0516 06/12/15 0422  WBC 8.5 7.8  RBC 4.01* 3.82*  HCT 36.0* 34.7*  PLT 178 164    Recent Labs  06/11/15 0516  NA 140  K 3.9  CL 104  CO2 25  BUN 15  CREATININE 1.11  GLUCOSE 144*  CALCIUM 8.4*   No results for input(s): LABPT, INR in the last 72 hours.  Neurologically intact.  Dressing changed.  Steri strips intact.  Wounds look good.  No drainage or signs of infection.  Calf nontender.  NVI.    Assessment/Plan: 2 Days Post-Op Procedure(s) (LRB): COMPUTER ASSISTED TOTAL KNEE ARTHROPLASTY (Left) Up with therapy, discharge home this afternoon after  Therapy.   YATES,MARK C 06/12/2015, 7:52 AM

## 2015-06-12 NOTE — Progress Notes (Signed)
Physical Therapy Treatment Patient Details Name: Tyler Greer MRN: [id number] DOB: 12-Dec-1935 Today's Date: 06/12/2015    History of Present Illness 80 y.o. male with h/o DM, diabetic neuropathy, vertigo admitted for L TKA.     PT Comments    Pt performed increased gait, review of HEP and successful completion of curb training for safe d/c home.    Follow Up Recommendations  Home health PT     Equipment Recommendations  Rolling walker with 5" wheels;3in1 (PT)    Recommendations for Other Services       Precautions / Restrictions Precautions Precautions: Knee;Fall Restrictions Weight Bearing Restrictions: Yes LLE Weight Bearing: Weight bearing as tolerated    Mobility  Bed Mobility               General bed mobility comments: Pt recieved in recliner chair on arrival.    Transfers Overall transfer level: Needs assistance Equipment used: Rolling walker (2 wheeled) Transfers: Sit to/from Stand Sit to Stand: Min guard         General transfer comment: Cues for hand placement advancement of LLE and upright trunk control.  Pt slow to descend but demonstrated good eccentric loading.   Ambulation/Gait Ambulation/Gait assistance: Min guard Ambulation Distance (Feet): 200 Feet Assistive device: Rolling walker (2 wheeled) Gait Pattern/deviations: Step-through pattern;Decreased stride length;Antalgic;Decreased step length - left   Gait velocity interpretation: Below normal speed for age/gender General Gait Details: verbal cues to lift head and increase step length, distance limited by pain/fatigue.  Pt slow with sequencing and fatigues quickly 2/4 DOE.     Stairs Stairs: Yes Stairs assistance: Min guard Stair Management: No rails Number of Stairs: 2 General stair comments: Pt performed curb trainign x 2 trials with cues for sequencing and RW placement for safe entry into home.  pt required cues for back to curb and negotiating with non-operative leg.  Pt  educated on transfer of RW from floor to curb.  Pt able to verbalize and demonstrate technique on next attempt.    Wheelchair Mobility    Modified Rankin (Stroke Patients Only)       Balance Overall balance assessment: Needs assistance   Sitting balance-Leahy Scale: Good       Standing balance-Leahy Scale: Fair                      Cognition Arousal/Alertness: Awake/alert Behavior During Therapy: WFL for tasks assessed/performed Overall Cognitive Status: Within Functional Limits for tasks assessed                      Exercises Total Joint Exercises Ankle Circles/Pumps: AROM;Both;10 reps;Supine Quad Sets: AROM;Left;10 reps Gluteal Sets: AROM;Both;10 reps Towel Squeeze: AROM;Both;10 reps Short Arc Quad: AROM;Left;10 reps Heel Slides: AROM;AAROM;Left;10 reps Hip ABduction/ADduction: AROM;Left;10 reps;AAROM Straight Leg Raises: AAROM;Left;10 reps Long Arc Quad: AAROM;Left;10 reps    General Comments        Pertinent Vitals/Pain Pain Assessment: 0-10 Pain Score: 8  Pain Location: L knee with 0 degree bone foam in place.   Pain Descriptors / Indicators: Aching;Burning Pain Intervention(s): Repositioned;Monitored during session;Ice applied    Home Living                      Prior Function            PT Goals (current goals can now be found in the care plan section) Acute Rehab PT Goals Patient Stated Goal: return to being independent Potential to  Achieve Goals: Good Progress towards PT goals: Progressing toward goals    Frequency  7X/week    PT Plan Current plan remains appropriate    Co-evaluation             End of Session Equipment Utilized During Treatment: Gait belt Activity Tolerance: Patient tolerated treatment well Patient left: with call bell/phone within reach;in bed;with family/visitor present     Time: 1000-1049 PT Time Calculation (min) (ACUTE ONLY): 49 min  Charges:  $Gait Training: 8-22  mins $Therapeutic Exercise: 8-22 mins $Therapeutic Activity: 8-22 mins                    G Codes:      Tyler Greer 06-26-15, 1:58 PM  Tyler Greer, PTA pager [phone]

## 2015-06-12 NOTE — Progress Notes (Signed)
Occupational Therapy Treatment Patient Details Name: Tyler Greer MRN: [id number] DOB: 1935-03-02 Today's Date: 06/12/2015    History of present illness 80 y.o. male with h/o DM, diabetic neuropathy, vertigo admitted for L TKA.    OT comments  Completed education with pt/caregiver regarding ADL and functional mobility for ADL. Pt safe to D/C home with caregiver. Continue OT per POC.  Follow Up Recommendations  Home health OT;Supervision/Assistance - 24 hour    Equipment Recommendations  3 in 1 bedside comode;Other (comment)    Recommendations for Other Services      Precautions / Restrictions Precautions Precautions: Knee;Fall Restrictions LLE Weight Bearing: Weight bearing as tolerated       Mobility Bed Mobility               General bed mobility comments: in bathroom  Transfers Overall transfer level: Needs assistance Equipment used: Rolling walker (2 wheeled) Transfers: Sit to/from Stand Sit to Stand: Min guard              Balance             Standing balance-Leahy Scale: Fair                     ADL               Lower Body Bathing: Minimal assistance;Sit to/from stand       Lower Body Dressing: Minimal assistance;With adaptive equipment;Sit to/from stand   Toilet Transfer: Min guard;Ambulation;BSC;RW   Toileting- Water quality scientist and Hygiene: Min guard;Sit to/from stand       Functional mobility during ADLs: Rolling walker;Min guard;Cueing for safety (uncontrolled descent to chair) General ADL Comments: Educated pt/caregiver on shower transfer technique. Educated on home safety and reducing risk of falls.      Vision                     Perception     Praxis      Cognition   Behavior During Therapy: WFL for tasks assessed/performed Overall Cognitive Status: Within Functional Limits for tasks assessed                       Extremity/Trunk Assessment               Exercises      Shoulder Instructions       General Comments      Pertinent Vitals/ Pain       Pain Assessment: 0-10 Pain Score: 6  Pain Location: L knee Pain Descriptors / Indicators: Aching;Burning Pain Intervention(s): Limited activity within patient's tolerance  Home Living                                          Prior Functioning/Environment              Frequency Min 2X/week     Progress Toward Goals  OT Goals(current goals can now be found in the care plan section)  Progress towards OT goals: Progressing toward goals  Acute Rehab OT Goals Patient Stated Goal: return to being independent OT Goal Formulation: With patient Time For Goal Achievement: 06/25/15 Potential to Achieve Goals: Good ADL Goals Pt Will Perform Grooming: with supervision;standing Pt Will Perform Lower Body Bathing: with supervision;sit to/from stand Pt Will Perform Lower Body Dressing: with supervision;sit to/from stand Pt Will Transfer  to Toilet: with supervision;ambulating;bedside commode Pt Will Perform Toileting - Clothing Manipulation and hygiene: with supervision;sit to/from stand Pt Will Perform Tub/Shower Transfer: Tub transfer;with supervision;ambulating;3 in 1;rolling walker  Plan Discharge plan remains appropriate    Co-evaluation                 End of Session Equipment Utilized During Treatment: Rolling walker   Activity Tolerance Patient tolerated treatment well   Patient Left in chair;with call bell/phone within reach;with family/visitor present   Nurse Communication Mobility status        Time: [id number] OT Time Calculation (min): 24 min  Charges: OT General Charges $OT Visit: 1 Procedure OT Treatments $Self Care/Home Management : 23-37 mins  Alfretta Pinch,HILLARY 06/12/2015, 1:30 PM   [facility], OTR/L  [phone] 06/12/2015

## 2015-06-13 DIAGNOSIS — Z471 Aftercare following joint replacement surgery: Secondary | ICD-10-CM | POA: Diagnosis not present

## 2015-06-13 DIAGNOSIS — R42 Dizziness and giddiness: Secondary | ICD-10-CM | POA: Diagnosis not present

## 2015-06-13 DIAGNOSIS — E1140 Type 2 diabetes mellitus with diabetic neuropathy, unspecified: Secondary | ICD-10-CM | POA: Diagnosis not present

## 2015-06-13 DIAGNOSIS — M1711 Unilateral primary osteoarthritis, right knee: Secondary | ICD-10-CM | POA: Diagnosis not present

## 2015-06-13 DIAGNOSIS — Z96652 Presence of left artificial knee joint: Secondary | ICD-10-CM | POA: Diagnosis not present

## 2015-06-13 DIAGNOSIS — G252 Other specified forms of tremor: Secondary | ICD-10-CM | POA: Diagnosis not present

## 2015-06-16 DIAGNOSIS — Z471 Aftercare following joint replacement surgery: Secondary | ICD-10-CM | POA: Diagnosis not present

## 2015-06-16 DIAGNOSIS — M1711 Unilateral primary osteoarthritis, right knee: Secondary | ICD-10-CM | POA: Diagnosis not present

## 2015-06-16 DIAGNOSIS — E1140 Type 2 diabetes mellitus with diabetic neuropathy, unspecified: Secondary | ICD-10-CM | POA: Diagnosis not present

## 2015-06-16 DIAGNOSIS — G252 Other specified forms of tremor: Secondary | ICD-10-CM | POA: Diagnosis not present

## 2015-06-16 DIAGNOSIS — R42 Dizziness and giddiness: Secondary | ICD-10-CM | POA: Diagnosis not present

## 2015-06-16 DIAGNOSIS — Z96652 Presence of left artificial knee joint: Secondary | ICD-10-CM | POA: Diagnosis not present

## 2015-06-16 NOTE — Anesthesia Postprocedure Evaluation (Signed)
Anesthesia Post Note  Patient: Tyler Greer  Procedure(s) Performed: Procedure(s) (LRB): COMPUTER ASSISTED TOTAL KNEE ARTHROPLASTY (Left)  Patient location during evaluation: PACU Anesthesia Type: General Level of consciousness: awake and awake and alert Pain management: pain level controlled Vital Signs Assessment: post-procedure vital signs reviewed and stable Respiratory status: spontaneous breathing, nonlabored ventilation and respiratory function stable Cardiovascular status: blood pressure returned to baseline Anesthetic complications: no    Last Vitals:  Filed Vitals:   06/11/15 2005 06/12/15 0513  BP: 128/66 129/74  Pulse: 103 101  Temp: 37.9 C 36.9 C  Resp: 18 18    Last Pain:  Filed Vitals:   06/12/15 1010  PainSc: 8                  Johanna Stafford COKER

## 2015-06-16 NOTE — Anesthesia Preprocedure Evaluation (Signed)
Anesthesia Evaluation  Patient identified by MRN, date of birth, ID band Patient awake    Reviewed: Allergy & Precautions, NPO status , Patient's Chart, lab work & pertinent test results  Airway Mallampati: II  TM Distance: >3 FB Neck ROM: Full    Dental  (+) Teeth Intact, Dental Advisory Given   Pulmonary    breath sounds clear to auscultation       Cardiovascular  Rhythm:Regular Rate:Normal     Neuro/Psych    GI/Hepatic   Endo/Other  diabetes  Renal/GU      Musculoskeletal   Abdominal   Peds  Hematology   Anesthesia Other Findings   Reproductive/Obstetrics                            Anesthesia Physical Anesthesia Plan  ASA: III  Anesthesia Plan: General   Post-op Pain Management:    Induction: Intravenous  Airway Management Planned: Oral ETT  Additional Equipment:   Intra-op Plan:   Post-operative Plan: Extubation in OR  Informed Consent: I have reviewed the patients History and Physical, chart, labs and discussed the procedure including the risks, benefits and alternatives for the proposed anesthesia with the patient or authorized representative who has indicated his/her understanding and acceptance.   Dental advisory given  Plan Discussed with: CRNA and Anesthesiologist  Anesthesia Plan Comments:        Anesthesia Quick Evaluation  

## 2015-06-17 DIAGNOSIS — M1711 Unilateral primary osteoarthritis, right knee: Secondary | ICD-10-CM | POA: Diagnosis not present

## 2015-06-17 DIAGNOSIS — Z96652 Presence of left artificial knee joint: Secondary | ICD-10-CM | POA: Diagnosis not present

## 2015-06-17 DIAGNOSIS — R42 Dizziness and giddiness: Secondary | ICD-10-CM | POA: Diagnosis not present

## 2015-06-17 DIAGNOSIS — G252 Other specified forms of tremor: Secondary | ICD-10-CM | POA: Diagnosis not present

## 2015-06-17 DIAGNOSIS — Z471 Aftercare following joint replacement surgery: Secondary | ICD-10-CM | POA: Diagnosis not present

## 2015-06-17 DIAGNOSIS — E1140 Type 2 diabetes mellitus with diabetic neuropathy, unspecified: Secondary | ICD-10-CM | POA: Diagnosis not present

## 2015-06-19 DIAGNOSIS — R42 Dizziness and giddiness: Secondary | ICD-10-CM | POA: Diagnosis not present

## 2015-06-19 DIAGNOSIS — G252 Other specified forms of tremor: Secondary | ICD-10-CM | POA: Diagnosis not present

## 2015-06-19 DIAGNOSIS — E1140 Type 2 diabetes mellitus with diabetic neuropathy, unspecified: Secondary | ICD-10-CM | POA: Diagnosis not present

## 2015-06-19 DIAGNOSIS — M1711 Unilateral primary osteoarthritis, right knee: Secondary | ICD-10-CM | POA: Diagnosis not present

## 2015-06-19 DIAGNOSIS — Z96652 Presence of left artificial knee joint: Secondary | ICD-10-CM | POA: Diagnosis not present

## 2015-06-19 DIAGNOSIS — Z471 Aftercare following joint replacement surgery: Secondary | ICD-10-CM | POA: Diagnosis not present

## 2015-06-22 DIAGNOSIS — Z96652 Presence of left artificial knee joint: Secondary | ICD-10-CM | POA: Diagnosis not present

## 2015-06-22 DIAGNOSIS — G252 Other specified forms of tremor: Secondary | ICD-10-CM | POA: Diagnosis not present

## 2015-06-22 DIAGNOSIS — R42 Dizziness and giddiness: Secondary | ICD-10-CM | POA: Diagnosis not present

## 2015-06-22 DIAGNOSIS — M1711 Unilateral primary osteoarthritis, right knee: Secondary | ICD-10-CM | POA: Diagnosis not present

## 2015-06-22 DIAGNOSIS — Z471 Aftercare following joint replacement surgery: Secondary | ICD-10-CM | POA: Diagnosis not present

## 2015-06-22 DIAGNOSIS — E1140 Type 2 diabetes mellitus with diabetic neuropathy, unspecified: Secondary | ICD-10-CM | POA: Diagnosis not present

## 2015-06-24 DIAGNOSIS — Z96652 Presence of left artificial knee joint: Secondary | ICD-10-CM | POA: Diagnosis not present

## 2015-06-24 DIAGNOSIS — Z471 Aftercare following joint replacement surgery: Secondary | ICD-10-CM | POA: Diagnosis not present

## 2015-06-24 DIAGNOSIS — G252 Other specified forms of tremor: Secondary | ICD-10-CM | POA: Diagnosis not present

## 2015-06-24 DIAGNOSIS — R42 Dizziness and giddiness: Secondary | ICD-10-CM | POA: Diagnosis not present

## 2015-06-24 DIAGNOSIS — E1140 Type 2 diabetes mellitus with diabetic neuropathy, unspecified: Secondary | ICD-10-CM | POA: Diagnosis not present

## 2015-06-24 DIAGNOSIS — M1712 Unilateral primary osteoarthritis, left knee: Secondary | ICD-10-CM | POA: Diagnosis not present

## 2015-06-24 DIAGNOSIS — M1711 Unilateral primary osteoarthritis, right knee: Secondary | ICD-10-CM | POA: Diagnosis not present

## 2015-06-26 DIAGNOSIS — G252 Other specified forms of tremor: Secondary | ICD-10-CM | POA: Diagnosis not present

## 2015-06-26 DIAGNOSIS — Z96652 Presence of left artificial knee joint: Secondary | ICD-10-CM | POA: Diagnosis not present

## 2015-06-26 DIAGNOSIS — E1140 Type 2 diabetes mellitus with diabetic neuropathy, unspecified: Secondary | ICD-10-CM | POA: Diagnosis not present

## 2015-06-26 DIAGNOSIS — Z471 Aftercare following joint replacement surgery: Secondary | ICD-10-CM | POA: Diagnosis not present

## 2015-06-26 DIAGNOSIS — M1711 Unilateral primary osteoarthritis, right knee: Secondary | ICD-10-CM | POA: Diagnosis not present

## 2015-06-26 DIAGNOSIS — R42 Dizziness and giddiness: Secondary | ICD-10-CM | POA: Diagnosis not present

## 2015-06-30 ENCOUNTER — Encounter: Payer: Self-pay | Admitting: Internal Medicine

## 2015-06-30 ENCOUNTER — Ambulatory Visit (INDEPENDENT_AMBULATORY_CARE_PROVIDER_SITE_OTHER): Payer: Medicare Other | Admitting: Internal Medicine

## 2015-06-30 VITALS — BP 120/78 | HR 96 | Temp 98.0°F | Ht 71.0 in | Wt 198.0 lb

## 2015-06-30 DIAGNOSIS — R3915 Urgency of urination: Secondary | ICD-10-CM

## 2015-06-30 DIAGNOSIS — E0840 Diabetes mellitus due to underlying condition with diabetic neuropathy, unspecified: Secondary | ICD-10-CM

## 2015-06-30 DIAGNOSIS — E119 Type 2 diabetes mellitus without complications: Secondary | ICD-10-CM

## 2015-06-30 DIAGNOSIS — E1140 Type 2 diabetes mellitus with diabetic neuropathy, unspecified: Secondary | ICD-10-CM | POA: Insufficient documentation

## 2015-06-30 LAB — POCT URINALYSIS DIPSTICK
GLUCOSE UA: NEGATIVE
Leukocytes, UA: NEGATIVE
NITRITE UA: NEGATIVE
Protein, UA: NEGATIVE
RBC UA: NEGATIVE
Spec Grav, UA: 1.02
Urobilinogen, UA: 0.2
pH, UA: 5

## 2015-06-30 LAB — POCT GLUCOSE (DEVICE FOR HOME USE): Glucose Fasting, POC: 105 mg/dL — AB (ref 70–99)

## 2015-06-30 MED ORDER — TAMSULOSIN HCL 0.4 MG PO CAPS: 0.4000 mg | ORAL_CAPSULE | Freq: Every day | ORAL | Status: DC

## 2015-06-30 MED ORDER — CIPROFLOXACIN HCL 500 MG PO TABS: 500.0000 mg | ORAL_TABLET | Freq: Two times a day (BID) | ORAL | Status: DC

## 2015-06-30 NOTE — Progress Notes (Signed)
Subjective:    Patient ID: Tyler Greer, male    DOB: [birth year], 80 y.o.   MRN: [id number]  HPI  Lab Results  Component Value Date   HGBA1C 5.6 05/07/2015    80 year old patient who is status post left total knee replacement surgery about 3 weeks ago.  He states at the time of his surgery.  He started having some urinary frequency.  More recently this has worsened.  He states that he has nocturia hourly.  At times he describes diminished flow but other times he states that he has a forceful urinary stream.  He also states there is some occasional dysuria.   No Foley cath of the time of his surgery.  He does have a history of diabetes which has been well-controlled  Past Medical History  Diagnosis Date  . Intention tremor   . Paresthesia of foot     bilateral  . Stroke ([facility]) 2005    mini stroke  . Diabetes mellitus type II 10/2007  . Migraine     "none in years" (06/10/2015)  . Osteoarthritis   . Arthritis     "hands and knees" (06/10/2015)  . Basal cell carcinoma, arm     "both"  . Basal cell carcinoma, face      Social History   Social History  . Marital Status: Widowed    Spouse Name: N/A  . Number of Children: N/A  . Years of Education: N/A   Occupational History  . Not on file.   Social History Main Topics  . Smoking status: Never Smoker   . Smokeless tobacco: Never Used     Comment: "might have smoked 1 pack of cigarettes in my life"  . Alcohol Use: No  . Drug Use: No  . Sexual Activity: Not Currently   Other Topics Concern  . Not on file   Social History Narrative   Designated third party release signed on 07/13/09    Past Surgical History  Procedure Laterality Date  . Tonsillectomy    . Lumbar laminectomy  X 2  . Knee arthroscopy Bilateral 2002  . Hammer toe surgery Bilateral   . Joint replacement    . Back surgery    . Total knee arthroplasty Left 06/10/2015  . Knee arthroplasty Left 06/10/2015    Procedure: COMPUTER ASSISTED TOTAL KNEE  ARTHROPLASTY;  Surgeon: Marybelle Killings, MD;  Location: [facility];  Service: Orthopedics;  Laterality: Left;    Family History  Problem Relation Age of Onset  . Heart disease Father     died age 23-MI  . Diabetes type II Brother   . Melanoma Brother     No Known Allergies  Current Outpatient Prescriptions on File Prior to Visit  Medication Sig Dispense Refill  . aspirin (BAYER ASPIRIN) 325 MG tablet Take 1 tablet (325 mg total) by mouth daily.    . metFORMIN (GLUCOPHAGE-XR) 500 MG 24 hr tablet TAKE TWO TABLETS EVERY DAY (Patient taking differently: Take 1 tablet by mouth twice a day) 180 tablet 3  . oxyCODONE-acetaminophen (ROXICET) 5-325 MG tablet Take 1-2 tablets by mouth every 4 (four) hours as needed for severe pain. 60 tablet 0   No current facility-administered medications on file prior to visit.    BP 120/78 mmHg  Pulse 96  Temp(Src) 98 F (36.7 C) (Oral)  Ht 5\' 11"  (1.803 m)  Wt 198 lb (89.812 kg)  BMI 27.63 kg/m2     Review of Systems  Constitutional: Negative for  fever, chills, appetite change and fatigue.  HENT: Negative for congestion, dental problem, ear pain, hearing loss, sore throat, tinnitus, trouble swallowing and voice change.   Eyes: Negative for pain, discharge and visual disturbance.  Respiratory: Negative for cough, chest tightness, wheezing and stridor.   Cardiovascular: Negative for chest pain, palpitations and leg swelling.  Gastrointestinal: Negative for nausea, vomiting, abdominal pain, diarrhea, constipation, blood in stool and abdominal distention.  Genitourinary: Positive for dysuria, frequency and difficulty urinating. Negative for urgency, hematuria, flank pain, discharge and genital sores.  Musculoskeletal: Negative for myalgias, back pain, joint swelling, arthralgias, gait problem and neck stiffness.  Skin: Negative for rash.  Neurological: Negative for dizziness, syncope, speech difficulty, weakness, numbness and headaches.  Hematological:  Negative for adenopathy. Does not bruise/bleed easily.  Psychiatric/Behavioral: Negative for behavioral problems and dysphoric mood. The patient is not nervous/anxious.        Objective:   Physical Exam  Constitutional: He appears well-developed and well-nourished. No distress.  Abdominal:  No obvious bladder distention Suprapubic dullness   Genitourinary:  Prostate only minimally enlarged Slightly tender and boggy          Assessment & Plan:      Urinary frequency.  Possible prostatitis.  Will check a blood sugar to rule out significant hyperglycemia.  Urinalysis was unremarkable.   Will place on Flomax and Cipro.  Will call if unimproved.  Possible BOO  with overflow incontinence   diabetes.  Well controlled

## 2015-06-30 NOTE — Patient Instructions (Addendum)
Take your antibiotic as prescribed until ALL of it is gone, but stop if you develop a rash, swelling, or any side effects of the medication.  Contact our office as soon as possible if  there are side effects of the medication.    Tamsulosin-  1 tablet twice daily for 5 days then 1 daily  Call or return to clinic prn if these symptoms worsen or fail to improve as anticipated.

## 2015-07-01 ENCOUNTER — Ambulatory Visit: Payer: Medicare Other | Attending: Orthopaedic Surgery | Admitting: Physical Therapy

## 2015-07-01 ENCOUNTER — Encounter: Payer: Self-pay | Admitting: Physical Therapy

## 2015-07-01 DIAGNOSIS — M6281 Muscle weakness (generalized): Secondary | ICD-10-CM | POA: Diagnosis not present

## 2015-07-01 DIAGNOSIS — M25562 Pain in left knee: Secondary | ICD-10-CM | POA: Diagnosis not present

## 2015-07-01 DIAGNOSIS — R262 Difficulty in walking, not elsewhere classified: Secondary | ICD-10-CM | POA: Insufficient documentation

## 2015-07-01 NOTE — Therapy (Signed)
[place], [place], [address] Phone: [phone]   Fax:  [phone]  Physical Therapy Evaluation  Patient Details  Name: Tyler Greer MRN: [id number] Date of Birth: January 22, 1936 Referring Provider: Rodell Perna, MD  Encounter Date: 07/01/2015      PT End of Session - 07/01/15 1340    Visit Number 1   Number of Visits 17   Date for PT Re-Evaluation 08/28/15   Authorization Type MCR KX at visit 15   PT Start Time 1330   PT Stop Time 1423   PT Time Calculation (min) 53 min   Activity Tolerance Patient tolerated treatment well   Behavior During Therapy [facility] for tasks assessed/performed      Past Medical History  Diagnosis Date  . Intention tremor   . Paresthesia of foot     bilateral  . Stroke ([facility]) 2005    mini stroke  . Diabetes mellitus type II 10/2007  . Migraine     "none in years" (06/10/2015)  . Osteoarthritis   . Arthritis     "hands and knees" (06/10/2015)  . Basal cell carcinoma, arm     "both"  . Basal cell carcinoma, face     Past Surgical History  Procedure Laterality Date  . Tonsillectomy    . Lumbar laminectomy  X 2  . Knee arthroscopy Bilateral 2002  . Hammer toe surgery Bilateral   . Joint replacement    . Back surgery    . Total knee arthroplasty Left 06/10/2015  . Knee arthroplasty Left 06/10/2015    Procedure: COMPUTER ASSISTED TOTAL KNEE ARTHROPLASTY;  Surgeon: Marybelle Killings, MD;  Location: [facility];  Service: Orthopedics;  Laterality: Left;    There were no vitals filed for this visit.       Subjective Assessment - 07/01/15 1340    Subjective L TKA 06/10/2015. Pt ambulating with RW. Reports Diabetes has been under control until surgery and has had increase in sugar levels. Has had severe difficulty falling asleep due to pain.    How long can you sit comfortably? unable to sit comfortably.    How long can you walk comfortably? 500-600 feet with rolling walker   Patient  Stated Goals able to sit with knee flexed comfortably in seated. 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 Yes   Pain Score 6    Pain Location Knee   Pain Orientation Left   Pain Descriptors / Indicators Stabbing   Pain Type Surgical pain   Pain Radiating Towards hip pain   Pain Onset 1 to 4 weeks ago   Pain Frequency Constant  eases and increases depending on pain pill intake   Aggravating Factors  sitting still   Pain Relieving Factors ice, pain medications            [facility] PT Assessment - 07/01/15 0001    Assessment   Medical Diagnosis L TKA   Referring Provider Rodell Perna, MD   Onset Date/Surgical Date 06/10/15   Hand Dominance Right;Left   Next MD Visit approx 5/17   Precautions   Precautions None   Restrictions   Weight Bearing Restrictions No   Balance Screen   Has the patient fallen in the past 6 months No   [place] residence   Living Arrangements Non-relatives/Friends   [place] to enter   Entrance Stairs-Number of Steps 1   Prior Function   Level  of Independence Independent   Cognition   Overall Cognitive Status Within Functional Limits for tasks assessed   Observation/Other Assessments   Focus on Therapeutic Outcomes (FOTO)  42% ability   ROM / Strength   AROM / PROM / Strength AROM;PROM;Strength   AROM   AROM Assessment Site Knee   Right/Left Knee Left   Left Knee Extension 9   Left Knee Flexion 86   PROM   PROM Assessment Site Knee   Right/Left Knee Left   Left Knee Extension 4   Left Knee Flexion 90   Strength   Strength Assessment Site Knee;Hip   Right/Left Hip Left;Right   Left Hip Flexion 3/5  30 deg extension lag   Left Hip Extension 3-/5   Left Hip ABduction 3/5   Right/Left Knee Right;Left   Right Knee Flexion 5/5   Right Knee Extension 5/5   Left Knee Flexion 3-/5   Left Knee Extension 2+/5                   [facility] Adult PT Treatment/Exercise -  07/01/15 0001    Exercises   Exercises Knee/Hip   Knee/Hip Exercises: Stretches   Passive Hamstring Stretch 3 reps;30 seconds  seated edge of bed   Other Knee/Hip Stretches --   Knee/Hip Exercises: Aerobic   Stationary Bike 5 min within available ROM   Knee/Hip Exercises: Standing   Other Standing Knee Exercises lateral weight shift   Modalities   Modalities Cryotherapy   Cryotherapy   Number Minutes Cryotherapy 10 Minutes   Cryotherapy Location Knee  knee extended   Type of Cryotherapy Ice pack                PT Education - 07/01/15 1430    Education provided Yes   Education Details anatomy of condition, POC, HEP, importance of full extension    Person(s) Educated Patient   Methods Explanation;Demonstration;Tactile cues;Handout;Verbal cues   Comprehension Verbalized understanding;Returned demonstration;Verbal cues required;Tactile cues required;Need further instruction          PT Short Term Goals - 07/01/15 1435    PT SHORT TERM GOAL #1   Title Decreased average pain levels <=3/10 by 07/31/15   Time 4   Period Weeks   Status New   PT SHORT TERM GOAL #2   Title extension to 0 deg, flexion to 120 deg by 07/31/15   Time 4   Period Weeks   Status New   PT SHORT TERM GOAL #3   Title able to demonstrate appropriate gait pattern without rolling walker.    Time 4   Period Weeks   Status New           PT Long Term Goals - 07/01/15 1315    PT LONG TERM GOAL #1   Title FOTO to 60% ability by 08/28/15   Time 8   Period Weeks   Status New   PT LONG TERM GOAL #2   Title hip and knee MMT 4+/5 on L in all directions   Time 8   Period Weeks   Status New   PT LONG TERM GOAL #3   Title able to indepenently navigate steps without need for UE support    Time 8   Period Weeks   Status New   PT LONG TERM GOAL #4   Title able to walk/stand for 1 hour without needing to sit down.    Time 8   Period Weeks   Status New  Plan - 07/01/15 1431     Clinical Impression Statement Pt presents today following TKA, he has had home health PT since surgery and is currently lacking ROM in flexion and extension. Pt was educated on importance of achieving full extension very soon. pt will benefit from skilled PT in order to improve ROM and strength back to functional levels so he is able to return to PLOF with ADLs and work activities.    Rehab Potential Good   PT Frequency 2x / week   PT Duration 8 weeks   PT Treatment/Interventions ADLs/Self Care Home Management;Cryotherapy;Electrical Stimulation;Moist Heat;Gait training;Stair training;Functional mobility training;Therapeutic activities;Therapeutic exercise;Balance training;Patient/family education;Neuromuscular re-education;Manual techniques;Vasopneumatic Device;Passive range of motion   PT Next Visit Plan bike for ROM, focus on gaining extension.    PT Home Exercise Plan seated hamstring stretch, lateral weight shifts, ice 3-4 times/day 10-15 min ea with knee suspended.    Consulted and Agree with Plan of Care Patient      Patient will benefit from skilled therapeutic intervention in order to improve the following deficits and impairments:  Abnormal gait, Pain, Improper body mechanics, Decreased activity tolerance, Decreased range of motion, Decreased strength, Increased edema, Difficulty walking, Decreased balance  Visit Diagnosis: Pain in left knee - Plan: PT plan of care cert/re-cert  Difficulty in walking, not elsewhere classified - Plan: PT plan of care cert/re-cert  Muscle weakness (generalized) - Plan: PT plan of care cert/re-cert      G-Codes - 0000000 1444    Functional Assessment Tool Used FOTO 58% disability, clinical judgement   Functional Limitation Mobility: Walking and moving around   Mobility: Walking and Moving Around Current Status [id number]) At least 40 percent but less than 60 percent impaired, limited or restricted   Mobility: Walking and Moving Around Goal Status  [phone]) At least 20 percent but less than 40 percent impaired, limited or restricted       Problem List Patient Active Problem List   Diagnosis Date Noted  . Diabetes mellitus with neuropathy ([facility]) 06/30/2015  . Status post total left knee replacement 06/10/2015  . Diabetic neuropathy ([facility]) 08/28/2013  . Testosterone deficiency 07/27/2010  . Dyslipidemia 07/27/2010  . HEADACHE 11/10/2009  . CORNS AND CALLUSES 07/17/2008  . Type 2 diabetes mellitus without complication ([place]) 0000000  . ACTION TREMOR 12/07/2006  . Osteoarthritis 12/07/2006  . PARESTHESIA 12/07/2006    Kaylen Nghiem C. Malayja Freund PT, DPT 07/01/2015 2:47 PM   [facility] [address] [place], [place], [address] Phone: [phone]   Fax:  [phone]  Name: Tyler Greer MRN: [id number] Date of Birth: Feb 09, 1936

## 2015-07-07 ENCOUNTER — Ambulatory Visit: Payer: Medicare Other

## 2015-07-07 DIAGNOSIS — M25562 Pain in left knee: Secondary | ICD-10-CM

## 2015-07-07 DIAGNOSIS — R262 Difficulty in walking, not elsewhere classified: Secondary | ICD-10-CM | POA: Diagnosis not present

## 2015-07-07 DIAGNOSIS — M6281 Muscle weakness (generalized): Secondary | ICD-10-CM | POA: Diagnosis not present

## 2015-07-07 NOTE — Patient Instructions (Signed)
Cued to not wall sit but to 20 degree sor so and to find high seat to practice sit to stand with equal foot position

## 2015-07-07 NOTE — Therapy (Signed)
Tyler Gre[place], [place], [address] Phone: [phone]   Fax:  [phone]  Physical Therapy Treatment  Patient Details  Name: Tyler Greer MRN: [id number] Date of Birth: 09-17-1935 Referring Provider: Rodell Perna, MD  Encounter Date: 07/07/2015      PT End of Session - 07/07/15 1233    Visit Number 2   Number of Visits 17   Date for PT Re-Evaluation 08/28/15   PT Start Time 1230   PT Stop Time 1330   PT Time Calculation (min) 60 min   Activity Tolerance Patient tolerated treatment well   Behavior During Therapy [facility] for tasks assessed/performed      Past Medical History  Diagnosis Date  . Intention tremor   . Paresthesia of foot     bilateral  . Stroke ([facility]) 2005    mini stroke  . Diabetes mellitus type II 10/2007  . Migraine     "none in years" (06/10/2015)  . Osteoarthritis   . Arthritis     "hands and knees" (06/10/2015)  . Basal cell carcinoma, arm     "both"  . Basal cell carcinoma, face     Past Surgical History  Procedure Laterality Date  . Tonsillectomy    . Lumbar laminectomy  X 2  . Knee arthroscopy Bilateral 2002  . Hammer toe surgery Bilateral   . Joint replacement    . Back surgery    . Total knee arthroplasty Left 06/10/2015  . Knee arthroplasty Left 06/10/2015    Procedure: COMPUTER ASSISTED TOTAL KNEE ARTHROPLASTY;  Surgeon: Marybelle Killings, MD;  Location: [facility];  Service: Orthopedics;  Laterality: Left;    There were no vitals filed for this visit.      Subjective Assessment - 07/07/15 1239    Subjective Rough last night . took pain pills today . Pain 6/10 today   Currently in Pain? Yes   Pain Score 6    Pain Location Knee   Pain Orientation Left   Pain Descriptors / Indicators Stabbing   Pain Type Surgical pain   Pain Onset 1 to 4 weeks ago   Pain Frequency Constant   Multiple Pain Sites No            [facility] PT Assessment - 07/07/15 0001    AROM   Left Knee  Extension 10   Left Knee Flexion 100                     [facility] Adult PT Treatment/Exercise - 07/07/15 1245    Ambulation/Gait   Ambulation Distance (Feet) 500 Feet   Assistive device Straight cane   Gait Pattern Step-through pattern   Ambulation Surface Level;Indoor;Paved   Stairs Yes   Stairs Assistance 5: Supervision   Stair Management Technique One rail Right;With cane   Number of Stairs 8   Height of Stairs 4   Gait Comments worked on pattern of step through and incr speed and keeping eyes forward   Knee/Hip Exercises: Aerobic   Stationary Bike 6 min for stretching into flexion    Knee/Hip Exercises: Standing   Wall Squat 15 reps;3 seconds  cue to shift Lt and very short squat due to RT knee OA   Knee/Hip Exercises: Seated   Long Arc Quad Right;20 reps   Long Arc Quad Weight 5 lbs.  5 sec hold   Sit to Sand 15 reps;with UE support  22 inch height   Knee/Hip Exercises: Supine  Other Supine Knee/Hip Exercises ent leg hip flexion 2 x10   Manual Therapy   Manual therapy comments PROM for extension and flexion motion LT knee sustaing presure 15-20 sec.  x 10 reps                PT Education - 07/07/15 1328    Education provided Yes   Education Details increase with to Lt leg   Person(s) Educated Patient   Methods Explanation;Verbal cues   Comprehension Returned demonstration;Verbalized understanding          PT Short Term Goals - 07/01/15 1435    PT SHORT TERM GOAL #1   Title Decreased average pain levels <=3/10 by 07/31/15   Time 4   Period Weeks   Status New   PT SHORT TERM GOAL #2   Title extension to 0 deg, flexion to 120 deg by 07/31/15   Time 4   Period Weeks   Status New   PT SHORT TERM GOAL #3   Title able to demonstrate appropriate gait pattern without rolling walker.    Time 4   Period Weeks   Status New           PT Long Term Goals - 07/01/15 1315    PT LONG TERM GOAL #1   Title FOTO to 60% ability by 08/28/15   Time 8    Period Weeks   Status New   PT LONG TERM GOAL #2   Title hip and knee MMT 4+/5 on L in all directions   Time 8   Period Weeks   Status New   PT LONG TERM GOAL #3   Title able to indepenently navigate steps without need for UE support    Time 8   Period Weeks   Status New   PT LONG TERM GOAL #4   Title able to walk/stand for 1 hour without needing to sit down.    Time 8   Period Weeks   Status New               Plan - 07/07/15 1233    Clinical Impression Statement Improved flexion active ROM. oterwise essentiall same with pain and extension ROM. He appears almost ready to shift to unilateral device.    PT Next Visit Plan bike for ROM, focus on gaining extension and flexion , strength.  gait   Consulted and Agree with Plan of Care Patient      Patient will benefit from skilled therapeutic intervention in order to improve the following deficits and impairments:  Abnormal gait, Pain, Improper body mechanics, Decreased activity tolerance, Decreased range of motion, Decreased strength, Increased edema, Difficulty walking, Decreased balance  Visit Diagnosis: Pain in left knee  Difficulty in walking, not elsewhere classified  Muscle weakness (generalized)     Problem List Patient Active Problem List   Diagnosis Date Noted  . Diabetes mellitus with neuropathy ([place]) 06/30/2015  . Status post total left knee replacement 06/10/2015  . Diabetic neuropathy ([place]) 08/28/2013  . Testosterone deficiency 07/27/2010  . Dyslipidemia 07/27/2010  . HEADACHE 11/10/2009  . CORNS AND CALLUSES 07/17/2008  . Type 2 diabetes mellitus without complication ([place]) 0000000  . ACTION TREMOR 12/07/2006  . Osteoarthritis 12/07/2006  . PARESTHESIA 12/07/2006    Tyler Greer  PT 07/07/2015, 1:31 PM  [facility] [address] [place], [place], [address] Phone: [phone]   Fax:  [phone]  Name: Tyler Greer MRN:  [id number] Date of  Birth: 1935/05/06

## 2015-07-09 ENCOUNTER — Ambulatory Visit: Payer: Medicare Other | Admitting: Physical Therapy

## 2015-07-09 DIAGNOSIS — M6281 Muscle weakness (generalized): Secondary | ICD-10-CM | POA: Diagnosis not present

## 2015-07-09 DIAGNOSIS — R262 Difficulty in walking, not elsewhere classified: Secondary | ICD-10-CM

## 2015-07-09 DIAGNOSIS — M25562 Pain in left knee: Secondary | ICD-10-CM

## 2015-07-09 NOTE — Patient Instructions (Addendum)
   Voncille Lo, PT 07/09/2015 9:37 AM Phone: [phone] Fax: [phone]

## 2015-07-09 NOTE — Therapy (Signed)
[place], [place], [address] Phone: [phone]   Fax:  [phone]  Physical Therapy Treatment  Patient Details  Name: Tyler Greer MRN: [id number] Date of Birth: Jun 23, 1935 Referring Provider: Rodell Perna, MD  Encounter Date: 07/09/2015      PT End of Session - 07/09/15 0932    Visit Number 3   Number of Visits 17   Date for PT Re-Evaluation 08/28/15   Authorization Type MCR KX at visit 15   PT Start Time 0930   PT Stop Time 1015   PT Time Calculation (min) 45 min   Activity Tolerance Patient tolerated treatment well   Behavior During Therapy [facility] for tasks assessed/performed      Past Medical History  Diagnosis Date  . Intention tremor   . Paresthesia of foot     bilateral  . Stroke ([place]) 2005    mini stroke  . Diabetes mellitus type II 10/2007  . Migraine     "none in years" (06/10/2015)  . Osteoarthritis   . Arthritis     "hands and knees" (06/10/2015)  . Basal cell carcinoma, arm     "both"  . Basal cell carcinoma, face     Past Surgical History  Procedure Laterality Date  . Tonsillectomy    . Lumbar laminectomy  X 2  . Knee arthroscopy Bilateral 2002  . Hammer toe surgery Bilateral   . Joint replacement    . Back surgery    . Total knee arthroplasty Left 06/10/2015  . Knee arthroplasty Left 06/10/2015    Procedure: COMPUTER ASSISTED TOTAL KNEE ARTHROPLASTY;  Surgeon: Marybelle Killings, MD;  Location: [facility];  Service: Orthopedics;  Laterality: Left;    There were no vitals filed for this visit.      Subjective Assessment - 07/09/15 0930    Subjective I took my pain pills so I could do PT better today.  Pain at 6/10   Patient Stated Goals able to sit with knee flexed comfortably in seated. 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 Yes   Pain Score 6    Pain Location Knee   Pain Orientation Left   Pain Descriptors / Indicators Throbbing;Stabbing   Pain Type  Surgical pain   Pain Onset 1 to 4 weeks ago   Pain Frequency Constant            [facility] PT Assessment - 07/09/15 0932    AROM   Left Knee Extension 9   Left Knee Flexion 103                     [facility] Adult PT Treatment/Exercise - 07/09/15 0932    Ambulation/Gait   Ambulation Distance (Feet) 300 Feet   Assistive device Straight cane   Gait Pattern Step-through pattern   Ambulation Surface Level;Indoor   Pre-Gait Activities step through pattern   Gait Comments Pt performed wall total joint assist exericses with bil UE flex and wt shift then  alternate L UE and R UE  flexion with LE wt shift  LLE near wall x 2 x 10 each   Self-Care   Self-Care Retrograde Massage;Other Self-Care Comments   RICE reinforced RICE concept for management of left knee edema and  pain   Retrograde Massage educated  and had pt retrun demonstration.    Other Self-Care Comments  Educated on self flexion using walker for leverage in chair and self meausurement of  progress with tape on floor at home for self monitoring.     Exercises   Exercises Knee/Hip   Knee/Hip Exercises: Stretches   Passive Hamstring Stretch 3 reps;30 seconds   PT assist   Knee/Hip Exercises: Aerobic   Stationary Bike 5 min 2.5 forward and 2.5 backward   Knee/Hip Exercises: Standing   Wall Squat --   Other Standing Knee Exercises hamstring scooter pull for 20 feet with PT CGA , hamsting walk for 20 feet with DF of left and extension of knee to encouraged  more normalized gait.   Knee/Hip Exercises: Seated   Long Arc Quad Right;20 reps   Long Arc Quad Weight 5 lbs.  5 sec hold   Sit to Sand 15 reps;with UE support  22 inch height   Manual Therapy   Manual Therapy Joint mobilization;Passive ROM   Manual therapy comments PROM for extension and flexion motion LT knee sustaing presure 15-20 sec.  x 10 reps   Joint Mobilization PA mobs with hip IR grade 2 with ankle on towel roll for extension.    Passive ROM PROM left  knee ext and flex x 20                  PT Education - 07/09/15 0952    Education provided Yes   Education Details Add HEP with Joint replacement Gait assistance exericise and retrograde massage and self monitoring of ROM of home ideas   Person(s) Educated Patient   Methods Explanation;Demonstration;Handout;Verbal cues;Tactile cues   Comprehension Verbalized understanding;Returned demonstration          PT Short Term Goals - 07/01/15 1435    PT SHORT TERM GOAL #1   Title Decreased average pain levels <=3/10 by 07/31/15   Time 4   Period Weeks   Status New   PT SHORT TERM GOAL #2   Title extension to 0 deg, flexion to 120 deg by 07/31/15   Time 4   Period Weeks   Status New   PT SHORT TERM GOAL #3   Title able to demonstrate appropriate gait pattern without rolling walker.    Time 4   Period Weeks   Status New           PT Long Term Goals - 07/01/15 1315    PT LONG TERM GOAL #1   Title FOTO to 60% ability by 08/28/15   Time 8   Period Weeks   Status New   PT LONG TERM GOAL #2   Title hip and knee MMT 4+/5 on L in all directions   Time 8   Period Weeks   Status New   PT LONG TERM GOAL #3   Title able to indepenently navigate steps without need for UE support    Time 8   Period Weeks   Status New   PT LONG TERM GOAL #4   Title able to walk/stand for 1 hour without needing to sit down.    Time 8   Period Weeks   Status New               Plan - 07/09/15 1015    Clinical Impression Statement Pt declined ice at end of session and decided he would administer at home. Pt able to make full revolutions on bike  Pt instructed HEP for improving gait with use of SPC . Discussed with pt benefits of progressing with a cane.  Pt  AROM of Left knee ext 9 to flexion 103.  Pt was appreciative of time to instruct in retrograde massage and reported  feeling better even with PROM  of knee and joint mobiilzation.  Pt is making excellent progress..NExt visit should bring  cane to session.   Rehab Potential Good   PT Frequency 2x / week   PT Duration 8 weeks   PT Treatment/Interventions ADLs/Self Care Home Management;Cryotherapy;Electrical Stimulation;Moist Heat;Gait training;Stair training;Functional mobility training;Therapeutic activities;Therapeutic exercise;Balance training;Patient/family education;Neuromuscular re-education;Manual techniques;Vasopneumatic Device;Passive range of motion   PT Next Visit Plan Progress to [facility] [place] [place].  maximize flex and extension. , gait strength   PT Home Exercise Plan seated hamstring stretch, lateral weight shifts, ice 3-4 times/day 10-15 min ea with knee suspended. added total joint gait exericises with quad /glut activation at wall   Consulted and Agree with Plan of Care Patient      Patient will benefit from skilled therapeutic intervention in order to improve the following deficits and impairments:  Abnormal gait, Pain, Improper body mechanics, Decreased activity tolerance, Decreased range of motion, Decreased strength, Increased edema, Difficulty walking, Decreased balance  Visit Diagnosis: Pain in left knee  Difficulty in walking, not elsewhere classified     Problem List Patient Active Problem List   Diagnosis Date Noted  . Diabetes mellitus with neuropathy ([place]) 06/30/2015  . Status post total left knee replacement 06/10/2015  . Diabetic neuropathy ([place]) 08/28/2013  . Testosterone deficiency 07/27/2010  . Dyslipidemia 07/27/2010  . HEADACHE 11/10/2009  . CORNS AND CALLUSES 07/17/2008  . Type 2 diabetes mellitus without complication ([place]) 0000000  . ACTION TREMOR 12/07/2006  . Osteoarthritis 12/07/2006  . PARESTHESIA 12/07/2006   Voncille Lo, PT 07/09/2015 2:28 PM Phone: [phone] Fax: [facility] [address] [place], [place], [address] Phone: [phone]   Fax:  [phone]  Name: Tyler Greer MRN: [id number] Date of  Birth: 03/15/1935

## 2015-07-10 ENCOUNTER — Ambulatory Visit (INDEPENDENT_AMBULATORY_CARE_PROVIDER_SITE_OTHER): Payer: Medicare Other | Admitting: Internal Medicine

## 2015-07-10 ENCOUNTER — Encounter: Payer: Self-pay | Admitting: Internal Medicine

## 2015-07-10 VITALS — BP 130/70 | HR 70 | Temp 99.2°F | Ht 71.0 in | Wt 196.0 lb

## 2015-07-10 DIAGNOSIS — E119 Type 2 diabetes mellitus without complications: Secondary | ICD-10-CM

## 2015-07-10 DIAGNOSIS — N3281 Overactive bladder: Secondary | ICD-10-CM | POA: Diagnosis not present

## 2015-07-10 DIAGNOSIS — R350 Frequency of micturition: Secondary | ICD-10-CM

## 2015-07-10 DIAGNOSIS — E0840 Diabetes mellitus due to underlying condition with diabetic neuropathy, unspecified: Secondary | ICD-10-CM

## 2015-07-10 LAB — POC URINALSYSI DIPSTICK (AUTOMATED)
Bilirubin, UA: NEGATIVE
Blood, UA: NEGATIVE
GLUCOSE UA: NEGATIVE
Ketones, UA: NEGATIVE
Leukocytes, UA: NEGATIVE
Nitrite, UA: NEGATIVE
Protein, UA: NEGATIVE
Urobilinogen, UA: 0.2
pH, UA: 6

## 2015-07-10 MED ORDER — MIRABEGRON ER 25 MG PO TB24: 25.0000 mg | ORAL_TABLET | Freq: Every day | ORAL | Status: DC

## 2015-07-10 NOTE — Progress Notes (Signed)
Pre visit review using our clinic review tool, if applicable. No additional management support is needed unless otherwise documented below in the visit note. 

## 2015-07-10 NOTE — Progress Notes (Signed)
Subjective:    Patient ID: Tyler Greer, male    DOB: 08/04/1935, 80 y.o.   MRN: [id number]  HPI  80 year old patient who is seen today in follow-up.  He was seen 10 days ago with significant urinary frequency, urgency, and almost hourly nocturia.  He states the symptoms really intensified following recent orthopedic surgery.  Evaluation revealed no evidence of bladder distention.  Prostate was mildly enlarged and appear to be slightly tender.  He was treated with Cipro for possible low-grade prostatitis and also placed on Flomax.  He is unimproved.  He continues to have nocturia and frequency hourly through the night.  He also has similar symptoms throughout the day, but less bothersome.  On closer questioning, it appears that he has had similar symptoms but not as severe for the last 9 years.  He dates this to the time of onset of diabetes.  Past Medical History  Diagnosis Date  . Intention tremor   . Paresthesia of foot     bilateral  . Stroke ([place]) 2005    mini stroke  . Diabetes mellitus type II 10/2007  . Migraine     "none in years" (06/10/2015)  . Osteoarthritis   . Arthritis     "hands and knees" (06/10/2015)  . Basal cell carcinoma, arm     "both"  . Basal cell carcinoma, face      Social History   Social History  . Marital Status: Widowed    Spouse Name: N/A  . Number of Children: N/A  . Years of Education: N/A   Occupational History  . Not on file.   Social History Main Topics  . Smoking status: Never Smoker   . Smokeless tobacco: Never Used     Comment: "might have smoked 1 pack of cigarettes in my life"  . Alcohol Use: No  . Drug Use: No  . Sexual Activity: Not Currently   Other Topics Concern  . Not on file   Social History Narrative   Designated third party release signed on 07/13/09    Past Surgical History  Procedure Laterality Date  . Tonsillectomy    . Lumbar laminectomy  X 2  . Knee arthroscopy Bilateral 2002  . Hammer toe surgery  Bilateral   . Joint replacement    . Back surgery    . Total knee arthroplasty Left 06/10/2015  . Knee arthroplasty Left 06/10/2015    Procedure: COMPUTER ASSISTED TOTAL KNEE ARTHROPLASTY;  Surgeon: Marybelle Killings, MD;  Location: [facility];  Service: Orthopedics;  Laterality: Left;    Family History  Problem Relation Age of Onset  . Heart disease Father     died age 67-MI  . Diabetes type II Brother   . Melanoma Brother     No Known Allergies  Current Outpatient Prescriptions on File Prior to Visit  Medication Sig Dispense Refill  . aspirin (BAYER ASPIRIN) 325 MG tablet Take 1 tablet (325 mg total) by mouth daily.    . ciprofloxacin (CIPRO) 500 MG tablet Take 1 tablet (500 mg total) by mouth 2 (two) times daily. 30 tablet 0  . metFORMIN (GLUCOPHAGE-XR) 500 MG 24 hr tablet TAKE TWO TABLETS EVERY DAY (Patient taking differently: Take 1 tablet by mouth twice a day) 180 tablet 3  . oxyCODONE-acetaminophen (ROXICET) 5-325 MG tablet Take 1-2 tablets by mouth every 4 (four) hours as needed for severe pain. 60 tablet 0  . tamsulosin (FLOMAX) 0.4 MG CAPS capsule Take 1 capsule (0.4 mg  total) by mouth daily. 30 capsule 3   No current facility-administered medications on file prior to visit.    BP 130/70 mmHg  Pulse 70  Temp(Src) 99.2 F (37.3 C) (Oral)  Ht 5\' 11"  (1.803 m)  Wt 196 lb (88.905 kg)  BMI 27.35 kg/m2  SpO2 98%     Review of Systems  Constitutional: Negative for fever, chills, appetite change and fatigue.  HENT: Negative for congestion, dental problem, ear pain, hearing loss, sore throat, tinnitus, trouble swallowing and voice change.   Eyes: Negative for pain, discharge and visual disturbance.  Respiratory: Negative for cough, chest tightness, wheezing and stridor.   Cardiovascular: Negative for chest pain, palpitations and leg swelling.  Gastrointestinal: Negative for nausea, vomiting, abdominal pain, diarrhea, constipation, blood in stool and abdominal distention.    Genitourinary: Positive for urgency, frequency and difficulty urinating. Negative for hematuria, flank pain, discharge and genital sores.  Musculoskeletal: Negative for myalgias, back pain, joint swelling, arthralgias, gait problem and neck stiffness.  Skin: Negative for rash.  Neurological: Negative for dizziness, syncope, speech difficulty, weakness, numbness and headaches.  Hematological: Negative for adenopathy. Does not bruise/bleed easily.  Psychiatric/Behavioral: Negative for behavioral problems and dysphoric mood. The patient is not nervous/anxious.        Objective:   Physical Exam  Constitutional: He appears well-developed and well-nourished. No distress.  Walking with a walker No distress          Assessment & Plan:   Probable overactive bladder.  Doubt bladder outlet obstruction.  We'll place on Myrbetriq and schedule urology consultation Status post left total knee replacement surgery Diabetes mellitus

## 2015-07-10 NOTE — Patient Instructions (Signed)
Overactive Bladder, Adult Overactive bladder is a group of urinary symptoms. With overactive bladder, you may suddenly feel the need to pass urine (urinate) right away. After feeling this sudden urge, you might also leak urine if you cannot get to the bathroom fast enough (urinary incontinence). These symptoms might interfere with your daily work or social activities. Overactive bladder symptoms may also wake you up at night. Overactive bladder affects the nerve signals between your bladder and your brain. Your bladder may get the signal to empty before it is full. Very sensitive muscles can also make your bladder squeeze too soon. CAUSES Many things can cause an overactive bladder. Possible causes include:  Urinary tract infection.  Infection of nearby tissues, such as the prostate.  Prostate enlargement.  Being pregnant with twins or more (multiples).  Surgery on the uterus or urethra.  Bladder stones, inflammation, or tumors.  Drinking too much caffeine or alcohol.  Certain medicines, especially those that you take to help your body get rid of extra fluid (diuretics) by increasing urine production.  Muscle or nerve weakness, especially from:  A spinal cord injury.  Stroke.  Multiple sclerosis.  Parkinson disease.  Diabetes. This can cause a high urine volume that fills the bladder so quickly that the normal urge to urinate is triggered very strongly.  Constipation. A buildup of too much stool can put pressure on your bladder. RISK FACTORS You may be at greater risk for overactive bladder if you:  Are an older adult.  Smoke.  Are going through menopause.  Have prostate problems.  Have a neurological disease, such as stroke, dementia, Parkinson disease, or multiple sclerosis (MS).  Eat or drink things that irritate the bladder. These include alcohol, spicy food, and caffeine.  Are overweight or obese. SIGNS AND SYMPTOMS  The signs and symptoms of an overactive  bladder include:  Sudden, strong urges to urinate.  Leaking urine.  Urinating eight or more times per day.  Waking up to urinate two or more times per night. DIAGNOSIS Your health care provider may suspect overactive bladder based on your symptoms. The health care provider will do a physical exam and take your medical history. Blood or urine tests may also be done. For example, you might need to have a bladder function test to check how well you can hold your urine. You might also need to see a health care provider who specializes in the urinary tract (urologist). TREATMENT Treatment for overactive bladder depends on the cause of your condition and whether it is mild or severe. Certain treatments can be done in your health care provider's office or clinic. You can also make lifestyle changes at home. Options include: Behavioral Treatments  Biofeedback. A specialist uses sensors to help you become aware of your body's signals.  Keeping a daily log of when you need to urinate and what happens after the urge. This may help you manage your condition.  Bladder training. This helps you learn to control the urge to urinate by following a schedule that directs you to urinate at regular intervals (timed voiding). At first, you might have to wait a few minutes after feeling the urge. In time, you should be able to schedule bathroom visits an hour or more apart.  Kegel exercises. These are exercises to strengthen the pelvic floor muscles, which support the bladder. Toning these muscles can help you control urination, even if your bladder muscles are overactive. A specialist will teach you how to do these exercises correctly. They   require daily practice.  Weight loss. If you are obese or overweight, losing weight might relieve your symptoms of overactive bladder. Talk to your health care provider about losing weight and whether there is a specific program or method that would work best for you.  Diet  change. This might help if constipation is making your overactive bladder worse. Your health care provider or a dietitian can explain ways to change what you eat to ease constipation. You might also need to consume less alcohol and caffeine or drink other fluids at different times of the day.  Stopping smoking.  Wearing pads to absorb leakage while you wait for other treatments to take effect. Physical Treatments  Electrical stimulation. Electrodes send gentle pulses of electricity to strengthen the nerves or muscles that help to control the bladder. Sometimes, the electrodes are placed outside of the body. In other cases, they might be placed inside the body (implanted). This treatment can take several months to have an effect.  Supportive devices. Women may need a plastic device that fits into the vagina and supports the bladder (pessary). Medicines Several medicines can help treat overactive bladder and are usually used along with other treatments. Some are injected into the muscles involved in urination. Others come in pill form. Your health care provider may prescribe:  Antispasmodics. These medicines block the signals that the nerves send to the bladder. This keeps the bladder from releasing urine at the wrong time.  Tricyclic antidepressants. These types of antidepressants also relax bladder muscles. Surgery  You may have a device implanted to help manage the nerve signals that indicate when you need to urinate.  You may have surgery to implant electrodes for electrical stimulation.  Sometimes, very severe cases of overactive bladder require surgery to change the shape of the bladder. HOME CARE INSTRUCTIONS   Take medicines only as directed by your health care provider.  Use any implants or a pessary as directed by your health care provider.  Make any diet or lifestyle changes that are recommended by your health care provider. These might include:  Drinking less fluid or  drinking at different times of the day. If you need to urinate often during the night, you may need to stop drinking fluids early in the evening.  Cutting down on caffeine or alcohol. Both can make an overactive bladder worse. Caffeine is found in coffee, tea, and sodas.  Doing Kegel exercises to strengthen muscles.  Losing weight if you need to.  Eating a healthy and balanced diet to prevent constipation.  Keep a journal or log to track how much and when you drink and also when you feel the need to urinate. This will help your health care provider to monitor your condition. SEEK MEDICAL CARE IF:  Your symptoms do not get better after treatment.  Your pain and discomfort are getting worse.  You have more frequent urges to urinate.  You have a fever. SEEK IMMEDIATE MEDICAL CARE IF: You are not able to control your bladder at all.   This information is not intended to replace advice given to you by your health care provider. Make sure you discuss any questions you have with your health care provider.   Document Released: 12/04/2008 Document Revised: 02/28/2014 Document Reviewed: 07/03/2013 Elsevier Interactive Patient Education 2016 [employer]  

## 2015-07-14 ENCOUNTER — Ambulatory Visit: Payer: Medicare Other | Admitting: Physical Therapy

## 2015-07-14 DIAGNOSIS — M6281 Muscle weakness (generalized): Secondary | ICD-10-CM | POA: Diagnosis not present

## 2015-07-14 DIAGNOSIS — R262 Difficulty in walking, not elsewhere classified: Secondary | ICD-10-CM | POA: Diagnosis not present

## 2015-07-14 DIAGNOSIS — M25562 Pain in left knee: Secondary | ICD-10-CM

## 2015-07-14 NOTE — Therapy (Signed)
[place], [place], [address] Phone: [phone]   Fax:  [phone]  Physical Therapy Treatment  Patient Details  Name: Tyler Greer MRN: [id number] Date of Birth: Nov 16, 1935 Referring Provider: Rodell Perna, MD  Encounter Date: 07/14/2015      PT End of Session - 07/14/15 0927    Visit Number 4   Number of Visits 17   Date for PT Re-Evaluation 08/28/15   Authorization Type MCR KX at visit 15   PT Start Time 0845   PT Stop Time 0940   PT Time Calculation (min) 55 min   Activity Tolerance Patient tolerated treatment well   Behavior During Therapy [facility] for tasks assessed/performed      Past Medical History  Diagnosis Date  . Intention tremor   . Paresthesia of foot     bilateral  . Stroke ([place]) 2005    mini stroke  . Diabetes mellitus type II 10/2007  . Migraine     "none in years" (06/10/2015)  . Osteoarthritis   . Arthritis     "hands and knees" (06/10/2015)  . Basal cell carcinoma, arm     "both"  . Basal cell carcinoma, face     Past Surgical History  Procedure Laterality Date  . Tonsillectomy    . Lumbar laminectomy  X 2  . Knee arthroscopy Bilateral 2002  . Hammer toe surgery Bilateral   . Joint replacement    . Back surgery    . Total knee arthroplasty Left 06/10/2015  . Knee arthroplasty Left 06/10/2015    Procedure: COMPUTER ASSISTED TOTAL KNEE ARTHROPLASTY;  Surgeon: Marybelle Killings, MD;  Location: [place];  Service: Orthopedics;  Laterality: Left;    There were no vitals filed for this visit.      Subjective Assessment - 07/14/15 0851    Subjective I ran out of my pain pills.  Walked alot yesterday, couldnt sleep because of pain.  Sees MD 07/21/15.     Currently in Pain? Yes   Pain Score 5    Pain Location Knee   Pain Orientation Left   Pain Type Surgical pain   Pain Onset More than a month ago   Pain Frequency Constant   Aggravating Factors  sitting still too long,  walking too much?    Pain Relieving Factors ice, pain meds                         [facility] Adult PT Treatment/Exercise - 07/14/15 0912    Self-Care   RICE reinforced RICE concept for management of left knee edema and  pain   Other Self-Care Comments  Educated on important of static hold stretching and HEP    Knee/Hip Exercises: Stretches   Active Hamstring Stretch Left;2 reps;30 seconds   Knee: Self-Stretch to increase Flexion Left;3 reps;30 seconds   Knee: Self-Stretch Limitations 2 ways (supine and standing on step)    Knee/Hip Exercises: Aerobic   Stationary Bike 5 min worked on Paramedic, no tension    Knee/Hip Exercises: Supine   Quad Sets Strengthening;Left;1 set;10 reps   Technical brewer Strengthening;Left;1 set;15 reps   Vasopneumatic   Number Minutes Vasopneumatic  15 minutes   Vasopnuematic Location  Knee   Vasopneumatic Pressure Low   Vasopneumatic Temperature  32 deg                PT Education - 07/14/15 1215  Education provided Yes   Education Details HEP and self stretching    Person(s) Educated Patient   Methods Explanation;Demonstration   Comprehension Verbalized understanding          PT Short Term Goals - 07/14/15 1218    PT SHORT TERM GOAL #1   Title Decreased average pain levels <=3/10 by 07/31/15   Status On-going   PT SHORT TERM GOAL #2   Title extension to 0 deg, flexion to 120 deg by 07/31/15   Status On-going   PT SHORT TERM GOAL #3   Title able to demonstrate appropriate gait pattern without rolling walker.    Status On-going           PT Long Term Goals - 07/14/15 1218    PT LONG TERM GOAL #1   Title FOTO to 60% ability by 08/28/15   Status On-going   PT LONG TERM GOAL #2   Title hip and knee MMT 4+/5 on L in all directions   Status On-going   PT LONG TERM GOAL #3   Title able to indepenently navigate steps without need for UE support    Status On-going   PT LONG TERM GOAL #4   Title able to  walk/stand for 1 hour without needing to sit down.    Status On-going               Plan - 07/14/15 1216    Clinical Impression Statement Pt has been seen by 4 different PTs and was disappointed about that.  I reassured him we would make efforts to avoid multiple therapists.  Doing well but needs reinforcemetn to do structured HEP.     PT Next Visit Plan Progress to [facility].  maximize flex and extension. , gait , strength   PT Home Exercise Plan seated hamstring stretch, lateral weight shifts, ice 3-4 times/day 10-15 min ea with knee suspended. added total joint gait exericises with quad /glut activation at wall   Consulted and Agree with Plan of Care Patient      Patient will benefit from skilled therapeutic intervention in order to improve the following deficits and impairments:  Abnormal gait, Pain, Improper body mechanics, Decreased activity tolerance, Decreased range of motion, Decreased strength, Increased edema, Difficulty walking, Decreased balance  Visit Diagnosis: Pain in left knee  Difficulty in walking, not elsewhere classified  Muscle weakness (generalized)     Problem List Patient Active Problem List   Diagnosis Date Noted  . Diabetes mellitus with neuropathy ([place]) 06/30/2015  . Status post total left knee replacement 06/10/2015  . Diabetic neuropathy ([place]) 08/28/2013  . Testosterone deficiency 07/27/2010  . Dyslipidemia 07/27/2010  . HEADACHE 11/10/2009  . CORNS AND CALLUSES 07/17/2008  . Type 2 diabetes mellitus without complication ([place]) 0000000  . ACTION TREMOR 12/07/2006  . Osteoarthritis 12/07/2006  . PARESTHESIA 12/07/2006    PAA,JENNIFER 07/14/2015, 12:21 PM  [facility] [address] [place], [place], [address] Phone: [phone]   Fax:  [phone]  Name: Tyler Greer MRN: [id number] Date of Birth: [birth year]    Raeford Razor, PT 07/14/2015 12:21 PM Phone: [phone] Fax:  [phone]

## 2015-07-14 NOTE — Patient Instructions (Signed)
Use a sheet looped around arch of foot to pull heel in towards bottom. HOLD FOR 30-60SEC and do this 2-3 times per day.    Knee-to-Chest Stretch: Unilateral    With hand behind right knee, pull knee in to chest until a comfortable stretch is felt in lower back and buttocks. Keep back relaxed. Hold __30__ seconds. Repeat ___2-3_ times per set. Do __2__ sets per session. Do ___2-3_ sessions per day.  http://orth.exer.us/126   Copyright  VHI. All rights reserved.

## 2015-07-16 ENCOUNTER — Ambulatory Visit: Payer: Medicare Other | Admitting: Physical Therapy

## 2015-07-16 DIAGNOSIS — M6281 Muscle weakness (generalized): Secondary | ICD-10-CM | POA: Diagnosis not present

## 2015-07-16 DIAGNOSIS — R262 Difficulty in walking, not elsewhere classified: Secondary | ICD-10-CM | POA: Diagnosis not present

## 2015-07-16 DIAGNOSIS — M25562 Pain in left knee: Secondary | ICD-10-CM

## 2015-07-16 NOTE — Therapy (Signed)
[place], [place], [address] Phone: [phone]   Fax:  [phone]  Physical Therapy Treatment  Patient Details  Name: Tyler Greer MRN: [id number] Date of Birth: 1935/09/05 Referring Provider: Rodell Perna, MD  Encounter Date: 07/16/2015      PT End of Session - 07/16/15 0928    Visit Number 5   Number of Visits 17   Date for PT Re-Evaluation 08/28/15   Authorization Type MCR KX at visit 15   PT Start Time 0800   PT Stop Time 0905   PT Time Calculation (min) 65 min      Past Medical History  Diagnosis Date  . Intention tremor   . Paresthesia of foot     bilateral  . Stroke ([place]) 2005    mini stroke  . Diabetes mellitus type II 10/2007  . Migraine     "none in years" (06/10/2015)  . Osteoarthritis   . Arthritis     "hands and knees" (06/10/2015)  . Basal cell carcinoma, arm     "both"  . Basal cell carcinoma, face     Past Surgical History  Procedure Laterality Date  . Tonsillectomy    . Lumbar laminectomy  X 2  . Knee arthroscopy Bilateral 2002  . Hammer toe surgery Bilateral   . Joint replacement    . Back surgery    . Total knee arthroplasty Left 06/10/2015  . Knee arthroplasty Left 06/10/2015    Procedure: COMPUTER ASSISTED TOTAL KNEE ARTHROPLASTY;  Surgeon: Marybelle Killings, MD;  Location: [place];  Service: Orthopedics;  Laterality: Left;    There were no vitals filed for this visit.      Subjective Assessment - 07/16/15 1057    Subjective The ice machine really helps   Currently in Pain? Yes   Pain Score 5    Pain Location Knee   Pain Orientation Left            [facility] PT Assessment - 07/16/15 0001    AROM   Left Knee Extension 25  quad lag   Left Knee Flexion 98   PROM   Left Knee Extension 4   Left Knee Flexion 105                     [facility] Adult PT Treatment/Exercise - 07/16/15 0001    Knee/Hip Exercises: Aerobic   Stationary Bike 5 minutes    Knee/Hip Exercises: Machines for Strengthening   Cybex Leg Press 1 plate bilateral 20 x2    Knee/Hip Exercises: Supine   Quad Sets Limitations during russian stim 5 minutes   Short Arc Quad Sets Limitations during russian stim 10 minutes with assist to reach full extension   Straight Leg Raises 5 reps   Straight Leg Raises Limitations -25 qyad lag   Modalities   Modalities Animal nutritionist Forensic scientist Parameters 45   Electrical Stimulation Goals Strength   Vasopneumatic   Number Minutes Vasopneumatic  15 minutes   Vasopnuematic Location  Knee   Vasopneumatic Pressure Low   Vasopneumatic Temperature  32                  PT Short Term Goals - 07/14/15 1218    PT SHORT TERM GOAL #1   Title Decreased average pain levels <=3/10 by 07/31/15   Status On-going  PT SHORT TERM GOAL #2   Title extension to 0 deg, flexion to 120 deg by 07/31/15   Status On-going   PT SHORT TERM GOAL #3   Title able to demonstrate appropriate gait pattern without rolling walker.    Status On-going           PT Long Term Goals - 07/14/15 1218    PT LONG TERM GOAL #1   Title FOTO to 60% ability by 08/28/15   Status On-going   PT LONG TERM GOAL #2   Title hip and knee MMT 4+/5 on L in all directions   Status On-going   PT LONG TERM GOAL #3   Title able to indepenently navigate steps without need for UE support    Status On-going   PT LONG TERM GOAL #4   Title able to walk/stand for 1 hour without needing to sit down.    Status On-going               Plan - 07/16/15 [id number]    Clinical Impression Statement Pt reports vasopneumatic device helpful with pain last visit. Pt's PROM 5-105 however quad lag -25 with SLR and SAQ exercises. Pt reports pain with quad sets and when leg is extended. Pt demonstrates trace VMO/quad set. Russian stim used to elicit quad  contraction. Assist needed to achieve full extension during russian stim . Vaso for pain and edema. Pt reports his knee feels alot better post treatment.    PT Next Visit Plan quad activaton, check goals      Patient will benefit from skilled therapeutic intervention in order to improve the following deficits and impairments:  Abnormal gait, Pain, Improper body mechanics, Decreased activity tolerance, Decreased range of motion, Decreased strength, Increased edema, Difficulty walking, Decreased balance  Visit Diagnosis: Pain in left knee  Difficulty in walking, not elsewhere classified  Muscle weakness (generalized)     Problem List Patient Active Problem List   Diagnosis Date Noted  . Diabetes mellitus with neuropathy ([place]) 06/30/2015  . Status post total left knee replacement 06/10/2015  . Diabetic neuropathy ([place]) 08/28/2013  . Testosterone deficiency 07/27/2010  . Dyslipidemia 07/27/2010  . HEADACHE 11/10/2009  . CORNS AND CALLUSES 07/17/2008  . Type 2 diabetes mellitus without complication ([place]) 0000000  . ACTION TREMOR 12/07/2006  . Osteoarthritis 12/07/2006  . PARESTHESIA 12/07/2006    Dorene Ar, PTA 07/16/2015, 10:58 AM  [facility] [address] [place], [place], [address] Phone: [phone]   Fax:  [phone]  Name: Tyler Greer MRN: [id number] Date of Birth: 10-May-1935

## 2015-07-21 ENCOUNTER — Ambulatory Visit: Payer: Medicare Other | Admitting: Physical Therapy

## 2015-07-21 ENCOUNTER — Encounter: Payer: Self-pay | Admitting: Physical Therapy

## 2015-07-21 DIAGNOSIS — M25562 Pain in left knee: Secondary | ICD-10-CM

## 2015-07-21 DIAGNOSIS — R262 Difficulty in walking, not elsewhere classified: Secondary | ICD-10-CM | POA: Diagnosis not present

## 2015-07-21 DIAGNOSIS — M6281 Muscle weakness (generalized): Secondary | ICD-10-CM

## 2015-07-21 NOTE — Therapy (Signed)
[place], [place], [address] Phone: [phone]   Fax:  [phone]  Physical Therapy Treatment  Patient Details  Name: Tyler Greer MRN: [id number] Date of Birth: 10/20/1935 Referring Provider: Rodell Perna, MD  Encounter Date: 07/21/2015      PT End of Session - 07/21/15 1019    Visit Number 6   Number of Visits 17   Date for PT Re-Evaluation 08/28/15   Authorization Type MCR KX at visit 15   PT Start Time 1020   PT Stop Time 1052   PT Time Calculation (min) 32 min      Past Medical History  Diagnosis Date  . Intention tremor   . Paresthesia of foot     bilateral  . Stroke ([place]) 2005    mini stroke  . Diabetes mellitus type II 10/2007  . Migraine     "none in years" (06/10/2015)  . Osteoarthritis   . Arthritis     "hands and knees" (06/10/2015)  . Basal cell carcinoma, arm     "both"  . Basal cell carcinoma, face     Past Surgical History  Procedure Laterality Date  . Tonsillectomy    . Lumbar laminectomy  X 2  . Knee arthroscopy Bilateral 2002  . Hammer toe surgery Bilateral   . Joint replacement    . Back surgery    . Total knee arthroplasty Left 06/10/2015  . Knee arthroplasty Left 06/10/2015    Procedure: COMPUTER ASSISTED TOTAL KNEE ARTHROPLASTY;  Surgeon: Marybelle Killings, MD;  Location: [place];  Service: Orthopedics;  Laterality: Left;    There were no vitals filed for this visit.      Subjective Assessment - 07/21/15 1024    Subjective Unable to lay supine or on L side due to L calf pain. Ran out of pain meds but has apt with surgeon today. Redness noted along incision line, pt reports high fever in knee at night time. Reports sores on gluts today that have formed from sitting more following surgery.    Currently in Pain? Yes   Pain Score 5    Pain Location Knee   Pain Orientation Left                         [facility] Adult PT Treatment/Exercise - 07/21/15  0001    Knee/Hip Exercises: Supine   Quad Sets 15 reps   Cryotherapy   Number Minutes Cryotherapy 10 Minutes   Cryotherapy Location Knee   Type of Cryotherapy Ice pack                PT Education - 07/21/15 1048    Education provided Yes   Education Details use of cane when walking, calf pain   Person(s) Educated Patient   Methods Explanation   Comprehension Verbalized understanding          PT Short Term Goals - 07/14/15 1218    PT SHORT TERM GOAL #1   Title Decreased average pain levels <=3/10 by 07/31/15   Status On-going   PT SHORT TERM GOAL #2   Title extension to 0 deg, flexion to 120 deg by 07/31/15   Status On-going   PT SHORT TERM GOAL #3   Title able to demonstrate appropriate gait pattern without rolling walker.    Status On-going           PT Long Term Goals - 07/14/15 1218  PT LONG TERM GOAL #1   Title FOTO to 60% ability by 08/28/15   Status On-going   PT LONG TERM GOAL #2   Title hip and knee MMT 4+/5 on L in all directions   Status On-going   PT LONG TERM GOAL #3   Title able to indepenently navigate steps without need for UE support    Status On-going   PT LONG TERM GOAL #4   Title able to walk/stand for 1 hour without needing to sit down.    Status On-going               Plan - 07/21/15 1050    Clinical Impression Statement Pain in calf today suspected DVT due to pitting edema, swelling and pain. Denied TTP as would be expected with muscular trigger point and reports he has not been taking his aspirin for about one week because he ran out at home. Is going to appointment with Dr Lorin Mercy today at 1:30, PT contacted office regarding findings today and held any exercises.  Pt reports he does not like to use cane but was educated on importance of use until quads are strong enough to support him.    PT Treatment/Interventions ADLs/Self Care Home Management;Cryotherapy;Electrical Stimulation;Moist Heat;Gait training;Stair training;Functional  mobility training;Therapeutic activities;Therapeutic exercise;Balance training;Patient/family education;Neuromuscular re-education;Manual techniques;Vasopneumatic Device;Passive range of motion   PT Next Visit Plan quad activaton, check goals   PT Home Exercise Plan seated hamstring stretch, lateral weight shifts, ice 3-4 times/day 10-15 min ea with knee suspended. added total joint gait exericises with quad /glut activation at wall   Consulted and Agree with Plan of Care Patient      Patient will benefit from skilled therapeutic intervention in order to improve the following deficits and impairments:  Abnormal gait, Pain, Improper body mechanics, Decreased activity tolerance, Decreased range of motion, Decreased strength, Increased edema, Difficulty walking, Decreased balance  Visit Diagnosis: Pain in left knee  Difficulty in walking, not elsewhere classified  Muscle weakness (generalized)     Problem List Patient Active Problem List   Diagnosis Date Noted  . Diabetes mellitus with neuropathy ([place]) 06/30/2015  . Status post total left knee replacement 06/10/2015  . Diabetic neuropathy ([place]) 08/28/2013  . Testosterone deficiency 07/27/2010  . Dyslipidemia 07/27/2010  . HEADACHE 11/10/2009  . CORNS AND CALLUSES 07/17/2008  . Type 2 diabetes mellitus without complication ([place]) 0000000  . ACTION TREMOR 12/07/2006  . Osteoarthritis 12/07/2006  . PARESTHESIA 12/07/2006    Mattilynn Forrer C. Brittany Osier PT, DPT 07/21/2015 1:11 PM   [facility] [address] [place], [place], [address] Phone: [phone]   Fax:  [phone]  Name: [place] MRN: [id number] Date of Birth: 11-Sep-1935

## 2015-07-23 ENCOUNTER — Ambulatory Visit: Payer: Medicare Other | Attending: Orthopaedic Surgery | Admitting: Physical Therapy

## 2015-07-23 DIAGNOSIS — R262 Difficulty in walking, not elsewhere classified: Secondary | ICD-10-CM | POA: Insufficient documentation

## 2015-07-23 DIAGNOSIS — M6281 Muscle weakness (generalized): Secondary | ICD-10-CM | POA: Insufficient documentation

## 2015-07-23 DIAGNOSIS — M25562 Pain in left knee: Secondary | ICD-10-CM

## 2015-07-23 NOTE — Therapy (Signed)
[place], [place], [address] Phone: [phone]   Fax:  [phone]  Physical Therapy Treatment  Patient Details  Name: [place] Greer MRN: [id number] Date of Birth: [birth year] Referring Provider: Rodell Perna, MD  Encounter Date: 07/23/2015      PT End of Session - 07/23/15 0921    Visit Number 7   Number of Visits 17   Date for PT Re-Evaluation 08/28/15   Authorization Type MCR KX at visit 15   PT Start Time 0850   PT Stop Time 0930   PT Time Calculation (min) 40 min      Past Medical History  Diagnosis Date  . Intention tremor   . Paresthesia of foot     bilateral  . Stroke ([place]) 2005    mini stroke  . Diabetes mellitus type II 10/2007  . Migraine     "none in years" (06/10/2015)  . Osteoarthritis   . Arthritis     "hands and knees" (06/10/2015)  . Basal cell carcinoma, arm     "both"  . Basal cell carcinoma, face     Past Surgical History  Procedure Laterality Date  . Tonsillectomy    . Lumbar laminectomy  X 2  . Knee arthroscopy Bilateral 2002  . Hammer toe surgery Bilateral   . Joint replacement    . Back surgery    . Total knee arthroplasty Left 06/10/2015  . Knee arthroplasty Left 06/10/2015    Procedure: COMPUTER ASSISTED TOTAL KNEE ARTHROPLASTY;  Surgeon: Marybelle Killings, MD;  Location: [place];  Service: Orthopedics;  Laterality: Left;    There were no vitals filed for this visit.      Subjective Assessment - 07/23/15 0852    Subjective Md said no blod clot. I still have trouble sleeping.    Currently in Pain? Yes   Pain Score 5    Pain Location Knee   Pain Orientation Left                         [facility] Adult PT Treatment/Exercise - 07/23/15 0001    Knee/Hip Exercises: Machines for Strengthening   Cybex Leg Press 1 plate bilateral 20, then bilateral concentric and left only eccentric, then 2 plates bilateral x 20   Knee/Hip Exercises: Seated   Long Arc  Quad Limitations during russian estim 15 minutes   Knee/Hip Exercises: Supine   Short Arc Quad Sets Limitations during russian estim 5 minutes   Straight Leg Raises Limitations during russian estim -20 quad lag x 5 minutes    Cryotherapy   Number Minutes Cryotherapy 10 Minutes   Cryotherapy Location Knee   Type of Cryotherapy Ice pack   Animal nutritionist Parameters 25   Electrical Stimulation Goals Strength                  PT Short Term Goals - 07/14/15 1218    PT SHORT TERM GOAL #1   Title Decreased average pain levels <=3/10 by 07/31/15   Status On-going   PT SHORT TERM GOAL #2   Title extension to 0 deg, flexion to 120 deg by 07/31/15   Status On-going   PT SHORT TERM GOAL #3   Title able to demonstrate appropriate gait pattern without rolling walker.    Status On-going  PT Long Term Goals - 07/14/15 1218    PT LONG TERM GOAL #1   Title FOTO to 60% ability by 08/28/15   Status On-going   PT LONG TERM GOAL #2   Title hip and knee MMT 4+/5 on L in all directions   Status On-going   PT LONG TERM GOAL #3   Title able to indepenently navigate steps without need for UE support    Status On-going   PT LONG TERM GOAL #4   Title able to walk/stand for 1 hour without needing to sit down.    Status On-going               Plan - 07/23/15 0936    Clinical Impression Statement Pt reports no DVT per MD. He also reports MD thinks he burnt his skin using too much ice. The MD told him to lift a gallon jug with his foot so he has been doing that intermittently throughout the day. He reports [place] stim seemed to help. Repeated russian stim during LAQ, SAQ , SLR with pt demonstrating -20 quad lag which is 5 degrees improved. He declined ice today.    PT Next Visit Plan continue to decrease quad lag, check goals       Patient will benefit from skilled  therapeutic intervention in order to improve the following deficits and impairments:  Abnormal gait, Pain, Improper body mechanics, Decreased activity tolerance, Decreased range of motion, Decreased strength, Increased edema, Difficulty walking, Decreased balance  Visit Diagnosis: Pain in left knee  Difficulty in walking, not elsewhere classified  Muscle weakness (generalized)     Problem List Patient Active Problem List   Diagnosis Date Noted  . Diabetes mellitus with neuropathy ([place]) 06/30/2015  . Status post total left knee replacement 06/10/2015  . Diabetic neuropathy ([place]) 08/28/2013  . Testosterone deficiency 07/27/2010  . Dyslipidemia 07/27/2010  . HEADACHE 11/10/2009  . CORNS AND CALLUSES 07/17/2008  . Type 2 diabetes mellitus without complication ([place]) 0000000  . ACTION TREMOR 12/07/2006  . Osteoarthritis 12/07/2006  . PARESTHESIA 12/07/2006    Dorene Ar, PTA 07/23/2015, 9:40 AM  [place], [place], [address] Phone: [phone]   Fax:  [phone]  Name: Tyler Greer MRN: [id number] Date of Birth: 1935-04-23

## 2015-07-28 ENCOUNTER — Ambulatory Visit: Payer: Medicare Other | Admitting: Physical Therapy

## 2015-07-28 ENCOUNTER — Encounter: Payer: Self-pay | Admitting: Physical Therapy

## 2015-07-28 DIAGNOSIS — M6281 Muscle weakness (generalized): Secondary | ICD-10-CM

## 2015-07-28 DIAGNOSIS — R262 Difficulty in walking, not elsewhere classified: Secondary | ICD-10-CM

## 2015-07-28 DIAGNOSIS — M25562 Pain in left knee: Secondary | ICD-10-CM

## 2015-07-28 NOTE — Therapy (Signed)
[place], [place], [address] Phone: [phone]   Fax:  [phone]  Physical Therapy Treatment  Patient Details  Name: Tyler Greer MRN: [id number] Date of Birth: 07/09/1935 Referring Provider: Rodell Perna, MD  Encounter Date: 07/28/2015      PT End of Session - 07/28/15 0919    Visit Number 8   Number of Visits 17   Date for PT Re-Evaluation 08/28/15   Authorization Type MCR KX at visit 15   PT Start Time 0846   PT Stop Time 0943   PT Time Calculation (min) 57 min   Activity Tolerance Patient tolerated treatment well   Behavior During Therapy [facility] for tasks assessed/performed      Past Medical History  Diagnosis Date  . Intention tremor   . Paresthesia of foot     bilateral  . Stroke ([place]) 2005    mini stroke  . Diabetes mellitus type II 10/2007  . Migraine     "none in years" (06/10/2015)  . Osteoarthritis   . Arthritis     "hands and knees" (06/10/2015)  . Basal cell carcinoma, arm     "both"  . Basal cell carcinoma, face     Past Surgical History  Procedure Laterality Date  . Tonsillectomy    . Lumbar laminectomy  X 2  . Knee arthroscopy Bilateral 2002  . Hammer toe surgery Bilateral   . Joint replacement    . Back surgery    . Total knee arthroplasty Left 06/10/2015  . Knee arthroplasty Left 06/10/2015    Procedure: COMPUTER ASSISTED TOTAL KNEE ARTHROPLASTY;  Surgeon: Marybelle Killings, MD;  Location: [place];  Service: Orthopedics;  Laterality: Left;    There were no vitals filed for this visit.      Subjective Assessment - 07/28/15 0857    Subjective was able to sleep in bed until about 1230 last night, woke up and went to recliner with pillow under knee and was able to go back to sleep. Cont discomfort in calf with pressure when supine. knee looks more swollen than normal and skin around calf is tighter.    Currently in Pain? Yes   Pain Score 5    Pain Location Knee   Pain  Orientation Left   Pain Descriptors / Indicators Sore   Aggravating Factors  standing for long periods, sitting with knee extended   Pain Relieving Factors knee prop and ice            [facility] PT Assessment - 07/28/15 0001    Observation/Other Assessments-Edema    Edema Circumferential   Circumferential Edema   Circumferential - Left  42.5cm at joint line, 39.5cm at bulk of calf   Palpation   Palpation comment pitting edema                     [facility] Adult PT Treatment/Exercise - 07/28/15 0001    Knee/Hip Exercises: Stretches   Passive Hamstring Stretch Limitations supine with green strap 3x20s   Knee/Hip Exercises: Machines for Strengthening   Cybex Leg Press 0 plates, PT assist LLE only   Knee/Hip Exercises: Standing   Knee Flexion 15 reps   Other Standing Knee Exercises standing weight shift 10x10s   Knee/Hip Exercises: Supine   Quad Sets 20 reps  10s holds   Other Supine Knee/Hip Exercises ankle pumps LE up wedge x20   Knee/Hip Exercises: Sidelying   Clams x30   Vasopneumatic  Number Minutes Vasopneumatic  15 minutes   Vasopnuematic Location  Knee   Vasopneumatic Pressure Low   Vasopneumatic Temperature  32                PT Education - 07/28/15 0919    Education provided Yes   Education Details avoiding diagnoal hold of cane, exercise form/rationale, importance of hip strength for knee support   Person(s) Educated Patient   Methods Explanation;Demonstration;Tactile cues;Verbal cues   Comprehension Verbalized understanding;Returned demonstration;Tactile cues required;Verbal cues required;Need further instruction          PT Short Term Goals - 07/28/15 0924    PT SHORT TERM GOAL #1   Title Decreased average pain levels <=3/10 by 07/31/15   Baseline 0-4/5 during the day, worse at night   Time 4   Period Weeks   Status On-going   PT SHORT TERM GOAL #2   Title extension to 0 deg, flexion to 120 deg by 07/31/15   Baseline 0-110 6/6   Time 4    Period Weeks   Status On-going   PT SHORT TERM GOAL #3   Title able to demonstrate appropriate gait pattern without rolling walker.    Baseline decreased WB in LLE using SPC   Time 4   Period Weeks   Status On-going           PT Long Term Goals - 07/14/15 1218    PT LONG TERM GOAL #1   Title FOTO to 60% ability by 08/28/15   Status On-going   PT LONG TERM GOAL #2   Title hip and knee MMT 4+/5 on L in all directions   Status On-going   PT LONG TERM GOAL #3   Title able to indepenently navigate steps without need for UE support    Status On-going   PT LONG TERM GOAL #4   Title able to walk/stand for 1 hour without needing to sit down.    Status On-going               Plan - 07/28/15 0932    Clinical Impression Statement Notable improvement in quad activation but lacking endurance with longer holds in SAQ. Notable pitting edema around knee joint and in calf with continued pain to pressure in gastroc/soleus, was cleared by MD for clots. Good patellar mobility. discussed gait pattern for heel toe and placing SPC directly into floor. Will continue to benefit from strenghening of LLE to improve stability and safety with gait and other daily activities.    PT Treatment/Interventions ADLs/Self Care Home Management;Cryotherapy;Electrical Stimulation;Moist Heat;Gait training;Stair training;Functional mobility training;Therapeutic activities;Therapeutic exercise;Balance training;Patient/family education;Neuromuscular re-education;Manual techniques;Vasopneumatic Device;Passive range of motion   PT Next Visit Plan quad & hip strenghening, decrease edema   PT Home Exercise Plan seated hamstring stretch, lateral weight shifts, ice 3-4 times/day 10-15 min ea with knee suspended. added total joint gait exericises with quad /glut activation at wall   Consulted and Agree with Plan of Care Patient      Patient will benefit from skilled therapeutic intervention in order to improve the  following deficits and impairments:  Abnormal gait, Pain, Improper body mechanics, Decreased activity tolerance, Decreased range of motion, Decreased strength, Increased edema, Difficulty walking, Decreased balance  Visit Diagnosis: Pain in left knee  Difficulty in walking, not elsewhere classified  Muscle weakness (generalized)     Problem List Patient Active Problem List   Diagnosis Date Noted  . Diabetes mellitus with neuropathy ([place]) 06/30/2015  . Status post total  left knee replacement 06/10/2015  . Diabetic neuropathy ([place]) 08/28/2013  . Testosterone deficiency 07/27/2010  . Dyslipidemia 07/27/2010  . HEADACHE 11/10/2009  . CORNS AND CALLUSES 07/17/2008  . Type 2 diabetes mellitus without complication ([place]) 0000000  . ACTION TREMOR 12/07/2006  . Osteoarthritis 12/07/2006  . PARESTHESIA 12/07/2006    Maricruz Lucero C. Brylinn Teaney PT, DPT 07/28/2015 9:36 AM   [facility] [address] [place], [place], [address] Phone: [phone]   Fax:  [phone]  Name: Tyler Greer MRN: [id number] Date of Birth: 04/04/1935

## 2015-07-30 ENCOUNTER — Ambulatory Visit: Payer: Medicare Other | Admitting: Physical Therapy

## 2015-07-30 DIAGNOSIS — R262 Difficulty in walking, not elsewhere classified: Secondary | ICD-10-CM

## 2015-07-30 DIAGNOSIS — M6281 Muscle weakness (generalized): Secondary | ICD-10-CM | POA: Diagnosis not present

## 2015-07-30 DIAGNOSIS — M25562 Pain in left knee: Secondary | ICD-10-CM | POA: Diagnosis not present

## 2015-07-30 NOTE — Therapy (Signed)
[place], [place], [address] Phone: [phone]   Fax:  [phone]  Physical Therapy Treatment  Patient Details  Name: Tyler Greer MRN: [id number] Date of Birth: [birth year] Referring Provider: Rodell Perna, MD  Encounter Date: 07/30/2015      PT End of Session - 07/30/15 0954    Visit Number 9   Number of Visits 17   Date for PT Re-Evaluation 08/28/15   Authorization Type MCR KX at visit 15   PT Start Time 0800   PT Stop Time 0855   PT Time Calculation (min) 55 min   Activity Tolerance Patient tolerated treatment well   Behavior During Therapy [facility] for tasks assessed/performed      Past Medical History  Diagnosis Date  . Intention tremor   . Paresthesia of foot     bilateral  . Stroke ([place]) 2005    mini stroke  . Diabetes mellitus type II 10/2007  . Migraine     "none in years" (06/10/2015)  . Osteoarthritis   . Arthritis     "hands and knees" (06/10/2015)  . Basal cell carcinoma, arm     "both"  . Basal cell carcinoma, face     Past Surgical History  Procedure Laterality Date  . Tonsillectomy    . Lumbar laminectomy  X 2  . Knee arthroscopy Bilateral 2002  . Hammer toe surgery Bilateral   . Joint replacement    . Back surgery    . Total knee arthroplasty Left 06/10/2015  . Knee arthroplasty Left 06/10/2015    Procedure: COMPUTER ASSISTED TOTAL KNEE ARTHROPLASTY;  Surgeon: Marybelle Killings, MD;  Location: [place];  Service: Orthopedics;  Laterality: Left;    There were no vitals filed for this visit.      Subjective Assessment - 07/30/15 0808    Subjective Patient reports his knee pain today is about a -5/10. His pain is better this morning. Last night he had significant pain.    How long can you sit comfortably? unable to sit comfortably.    How long can you walk comfortably? 500-600 feet with rolling walker   Patient Stated Goals able to sit with knee flexed comfortably in seated.  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 Yes   Pain Score 5    Pain Location Knee   Pain Orientation Left   Pain Descriptors / Indicators Sore   Pain Type Surgical pain   Pain Onset More than a month ago   Pain Frequency Constant   Aggravating Factors  Standing for long periods of time    Pain Relieving Factors Knee prop with ice    Multiple Pain Sites No                         [facility] Adult PT Treatment/Exercise - 07/30/15 0001    Knee/Hip Exercises: Stretches   Passive Hamstring Stretch Limitations supine with green strap 3x20s   Knee/Hip Exercises: Aerobic   Nustep 5 min    Knee/Hip Exercises: Machines for Strengthening   Cybex Leg Press 1 plates, PT assist LLE only 2x10    Knee/Hip Exercises: Standing   Heel Raises Limitations 2x10   Hip Flexion Limitations 2x10   Knee/Hip Exercises: Seated   Long Arc Quad Limitations 2x10   Knee/Hip Exercises: Supine   Quad Sets 20 reps  10s holds   Knee/Hip Exercises: Sidelying   Clams x30  Cryotherapy   Number Minutes Cryotherapy 10 Minutes   Cryotherapy Location Knee   Type of Cryotherapy Ice pack   Manual Therapy   Manual Therapy Joint mobilization   Manual therapy comments PROM for extension and flexion motion LT knee sustaing presure 15-20 sec.  x 10 reps   Joint Mobilization Patellar mobilizations    Passive ROM Flexion and extension                   PT Short Term Goals - 07/28/15 0924    PT SHORT TERM GOAL #1   Title Decreased average pain levels <=3/10 by 07/31/15   Baseline 0-4/5 during the day, worse at night   Time 4   Period Weeks   Status On-going   PT SHORT TERM GOAL #2   Title extension to 0 deg, flexion to 120 deg by 07/31/15   Baseline 0-110 6/6   Time 4   Period Weeks   Status On-going   PT SHORT TERM GOAL #3   Title able to demonstrate appropriate gait pattern without rolling walker.    Baseline decreased WB in LLE using SPC   Time 4   Period Weeks    Status On-going           PT Long Term Goals - 07/14/15 1218    PT LONG TERM GOAL #1   Title FOTO to 60% ability by 08/28/15   Status On-going   PT LONG TERM GOAL #2   Title hip and knee MMT 4+/5 on L in all directions   Status On-going   PT LONG TERM GOAL #3   Title able to indepenently navigate steps without need for UE support    Status On-going   PT LONG TERM GOAL #4   Title able to walk/stand for 1 hour without needing to sit down.    Status On-going               Plan - 07/30/15 1000    Clinical Impression Statement Patient tolerated treatment well. He had good quad contraction with quad sets and laq. Less edema today. Continue to progress strengthening. The patient only has appointments next week. He is progressing towards goals.    Rehab Potential Good   PT Treatment/Interventions ADLs/Self Care Home Management;Cryotherapy;Electrical Stimulation;Moist Heat;Gait training;Stair training;Functional mobility training;Therapeutic activities;Therapeutic exercise;Balance training;Patient/family education;Neuromuscular re-education;Manual techniques;Vasopneumatic Device;Passive range of motion   PT Next Visit Plan quad & hip strenghening, decrease edema; continue to progress exercises; if swelling stay under control consider squats and low steps?    PT Home Exercise Plan seated hamstring stretch, lateral weight shifts, ice 3-4 times/day 10-15 min ea with knee suspended. added total joint gait exericises with quad /glut activation at wall   Consulted and Agree with Plan of Care Patient      Patient will benefit from skilled therapeutic intervention in order to improve the following deficits and impairments:  Abnormal gait, Pain, Improper body mechanics, Decreased activity tolerance, Decreased range of motion, Decreased strength, Increased edema, Difficulty walking, Decreased balance  Visit Diagnosis: Pain in left knee  Difficulty in walking, not elsewhere  classified  Muscle weakness (generalized)     Problem List Patient Active Problem List   Diagnosis Date Noted  . Diabetes mellitus with neuropathy ([place]) 06/30/2015  . Status post total left knee replacement 06/10/2015  . Diabetic neuropathy ([place]) 08/28/2013  . Testosterone deficiency 07/27/2010  . Dyslipidemia 07/27/2010  . HEADACHE 11/10/2009  . CORNS AND CALLUSES 07/17/2008  . Type  2 diabetes mellitus without complication ([place]) 0000000  . ACTION TREMOR 12/07/2006  . Osteoarthritis 12/07/2006  . PARESTHESIA 12/07/2006    Carney Living PT DPT  07/30/2015, 5:46 PM  [facility] [address] [place], [place], [address] Phone: [phone]   Fax:  [phone]  Name: DAWAUN PASE MRN: [id number] Date of Birth: [birth year]

## 2015-08-04 ENCOUNTER — Encounter: Payer: Self-pay | Admitting: Physical Therapy

## 2015-08-04 ENCOUNTER — Ambulatory Visit: Payer: Medicare Other | Admitting: Physical Therapy

## 2015-08-04 DIAGNOSIS — R262 Difficulty in walking, not elsewhere classified: Secondary | ICD-10-CM

## 2015-08-04 DIAGNOSIS — M6281 Muscle weakness (generalized): Secondary | ICD-10-CM | POA: Diagnosis not present

## 2015-08-04 DIAGNOSIS — M25562 Pain in left knee: Secondary | ICD-10-CM

## 2015-08-04 NOTE — Therapy (Signed)
[place], [place], [address] Phone: [phone]   Fax:  [phone]  Physical Therapy Treatment  Patient Details  Name: Tyler Greer MRN: [id number] Date of Birth: 1935-10-10 Referring Provider: Rodell Perna, MD  Encounter Date: 08/04/2015      PT End of Session - 08/04/15 0852    Visit Number 10   Number of Visits 17   Date for PT Re-Evaluation 08/28/15   Authorization Type MCR KX at visit 15   PT Start Time 0848   PT Stop Time 0928   PT Time Calculation (min) 40 min   Activity Tolerance Patient tolerated treatment well   Behavior During Therapy [facility] for tasks assessed/performed      Past Medical History  Diagnosis Date  . Intention tremor   . Paresthesia of foot     bilateral  . Stroke ([place]) 2005    mini stroke  . Diabetes mellitus type II 10/2007  . Migraine     "none in years" (06/10/2015)  . Osteoarthritis   . Arthritis     "hands and knees" (06/10/2015)  . Basal cell carcinoma, arm     "both"  . Basal cell carcinoma, face     Past Surgical History  Procedure Laterality Date  . Tonsillectomy    . Lumbar laminectomy  X 2  . Knee arthroscopy Bilateral 2002  . Hammer toe surgery Bilateral   . Joint replacement    . Back surgery    . Total knee arthroplasty Left 06/10/2015  . Knee arthroplasty Left 06/10/2015    Procedure: COMPUTER ASSISTED TOTAL KNEE ARTHROPLASTY;  Surgeon: Marybelle Killings, MD;  Location: [place];  Service: Orthopedics;  Laterality: Left;    There were no vitals filed for this visit.      Subjective Assessment - 08/04/15 0855    Subjective Pt reporting no pain at present but reporting decreased sleep at night due to increased pain 7-8/10. Pt reporting not taking any pain meds. Pt reports after getting up and moving around his knee felt better.    Currently in Pain? No/denies  Pt reported 7-8/10 pain at night   Pain Score 0-No pain            [facility] PT  Assessment - 08/04/15 0001    AROM   Left Knee Flexion 105   PROM   PROM Assessment Site Knee   Right/Left Knee Left   Left Knee Extension 4   Left Knee Flexion 112                     [facility] Adult PT Treatment/Exercise - 08/04/15 0001    Ambulation/Gait   Ambulation Distance (Feet) 60 Feet  progresing with heel to toe gait pattern & equal step length   Assistive device Straight cane   Gait Pattern Step-through pattern   Ambulation Surface Level   Knee/Hip Exercises: Stretches   Passive Hamstring Stretch Limitations 5 x 30 second hold   Knee/Hip Exercises: Aerobic   Nustep 10 minutes level 8   Knee/Hip Exercises: Standing   Other Standing Knee Exercises sit to stand with weight shifting verabal and tactile cues to the left side x 10   Knee/Hip Exercises: Sidelying   Clams x30   Cryotherapy   Number Minutes Cryotherapy 15 Minutes   Cryotherapy Location Knee  left   Type of Cryotherapy Ice pack   Manual Therapy   Manual Therapy Joint mobilization   Manual therapy  comments PROM for extension and flexion motion LT knee sustaing presure 15-20 sec.  x 10 reps   Joint Mobilization Patellar mobilizations    Passive ROM Flexion and extension                 PT Education - 08/04/15 1249    Education provided Yes   Education Details Pt instructed to perform HEP and ice at home as needed for pain. Also reviewed importance of continued strength and endurance to improve overall functional mobility and safety   Person(s) Educated Patient   Methods Explanation   Comprehension Verbalized understanding          PT Short Term Goals - 08/04/15 0934    PT SHORT TERM GOAL #1   Title Decreased average pain levels <=3/10 by 07/31/15   Baseline 0-3/10  during the day, worse at night increasing up to 8/10   Status On-going   PT SHORT TERM GOAL #2   Title extension to 0 deg, flexion to 120 deg by 07/31/15   Baseline pt with -4 degrees extension L knee, 112 degrees flexion  L knee   Time 4   Status On-going   PT SHORT TERM GOAL #3   Title able to demonstrate appropriate gait pattern without rolling walker.    Baseline decreased WB in LLE using SPC   Time 4           PT Long Term Goals - 07/14/15 1218    PT LONG TERM GOAL #1   Title FOTO to 60% ability by 08/28/15   Status On-going   PT LONG TERM GOAL #2   Title hip and knee MMT 4+/5 on L in all directions   Status On-going   PT LONG TERM GOAL #3   Title able to indepenently navigate steps without need for UE support    Status On-going   PT LONG TERM GOAL #4   Title able to walk/stand for 1 hour without needing to sit down.    Status On-going               Plan - 08/04/15 0944    Clinical Impression Statement Pt tolerated treatment well. He had mild swelling in his L knee. Pt reporting no pain at present, but increased pain at night when trying to sleep. Pt is making great progress with therapy and has improved his Knee ROM to -4 degrees extension and 112 degrees flexion. Pt is making progress toward his STG and LTG's.    Rehab Potential Good   PT Frequency 2x / week   PT Duration 8 weeks   PT Treatment/Interventions ADLs/Self Care Home Management;Cryotherapy;Electrical Stimulation;Moist Heat;Gait training;Stair training;Functional mobility training;Therapeutic activities;Therapeutic exercise;Balance training;Patient/family education;Neuromuscular re-education;Manual techniques;Vasopneumatic Device;Passive range of motion   PT Next Visit Plan quad & hip strenghening, decrease edema; continue to progress exercises; if swelling stay under control consider squats and low steps; standing ther-ex   PT Home Exercise Plan seated hamstring stretch, lateral weight shifts, ice 3-4 times/day 10-15 min ea with knee suspended. added total joint gait exericises with quad /glut activation at wall   Consulted and Agree with Plan of Care Patient      Patient will benefit from skilled therapeutic intervention  in order to improve the following deficits and impairments:  Increased edema, Decreased endurance, Difficulty walking, Decreased mobility, Decreased balance, Decreased activity tolerance, Decreased strength  Visit Diagnosis: Pain in left knee  Muscle weakness (generalized)  Difficulty in walking, not elsewhere classified  G-Codes - 08/04/15 0951    Functional Assessment Tool Used clinical judgement   Functional Limitation Mobility: Walking and moving around   Mobility: Walking and Moving Around Current Status [phone]) At least 40 percent but less than 60 percent impaired, limited or restricted   Mobility: Walking and Moving Around Goal Status [phone]) At least 20 percent but less than 40 percent impaired, limited or restricted      Problem List Patient Active Problem List   Diagnosis Date Noted  . Diabetes mellitus with neuropathy ([place]) 06/30/2015  . Status post total left knee replacement 06/10/2015  . Diabetic neuropathy ([place]) 08/28/2013  . Testosterone deficiency 07/27/2010  . Dyslipidemia 07/27/2010  . HEADACHE 11/10/2009  . CORNS AND CALLUSES 07/17/2008  . Type 2 diabetes mellitus without complication ([place]) 0000000  . ACTION TREMOR 12/07/2006  . Osteoarthritis 12/07/2006  . PARESTHESIA 12/07/2006    Oretha Caprice, MPT 08/04/2015, 12:54 PM  [facility] [address] [place], [place], [address] Phone: [phone]   Fax:  [phone]  Name: RONDALD JANOFF MRN: [id number] Date of Birth: Jun 11, 1935

## 2015-08-06 ENCOUNTER — Encounter: Payer: Self-pay | Admitting: Physical Therapy

## 2015-08-06 ENCOUNTER — Ambulatory Visit: Payer: Medicare Other | Admitting: Physical Therapy

## 2015-08-06 DIAGNOSIS — R262 Difficulty in walking, not elsewhere classified: Secondary | ICD-10-CM | POA: Diagnosis not present

## 2015-08-06 DIAGNOSIS — M25562 Pain in left knee: Secondary | ICD-10-CM

## 2015-08-06 DIAGNOSIS — M6281 Muscle weakness (generalized): Secondary | ICD-10-CM | POA: Diagnosis not present

## 2015-08-06 NOTE — Therapy (Signed)
[place], [place], [address] Phone: [phone]   Fax:  [phone]  Physical Therapy Treatment  Patient Details  Name: Tyler Greer MRN: [id number] Date of Birth: [birth year] Referring Provider: Rodell Perna, MD  Encounter Date: 08/06/2015      PT End of Session - 08/06/15 0823    Visit Number 11   Number of Visits 17   Date for PT Re-Evaluation 08/28/15   Authorization Type MCR KX at visit 15   PT Start Time 0800   PT Stop Time 0853   PT Time Calculation (min) 53 min   Activity Tolerance Patient tolerated treatment well   Behavior During Therapy [facility] for tasks assessed/performed      Past Medical History  Diagnosis Date  . Intention tremor   . Paresthesia of foot     bilateral  . Stroke ([place]) 2005    mini stroke  . Diabetes mellitus type II 10/2007  . Migraine     "none in years" (06/10/2015)  . Osteoarthritis   . Arthritis     "hands and knees" (06/10/2015)  . Basal cell carcinoma, arm     "both"  . Basal cell carcinoma, face     Past Surgical History  Procedure Laterality Date  . Tonsillectomy    . Lumbar laminectomy  X 2  . Knee arthroscopy Bilateral 2002  . Hammer toe surgery Bilateral   . Joint replacement    . Back surgery    . Total knee arthroplasty Left 06/10/2015  . Knee arthroplasty Left 06/10/2015    Procedure: COMPUTER ASSISTED TOTAL KNEE ARTHROPLASTY;  Surgeon: Marybelle Killings, MD;  Location: [place];  Service: Orthopedics;  Laterality: Left;    There were no vitals filed for this visit.      Subjective Assessment - 08/06/15 0819    Subjective Pt reports he has been sleeping in his bed for 3 nights now but is having difficulty sleeping. Experiencing significant swelling. Reports he thinks he may have a recall on the patella due to popping but recalls being told that swelling can cause misalignment and result in popping.    How long can you sit comfortably? 1 hour   How  long can you stand comfortably? 15-20 min   How long can you walk comfortably? 1 hour (level surfaces)   Patient Stated Goals able to sit with knee flexed comfortably in seated. 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 Yes   Pain Score 4    Pain Location Knee   Pain Orientation Left   Pain Descriptors / Indicators Aching   Pain Type Surgical pain   Aggravating Factors  sleeping with leg straight, walking on concrete   Pain Relieving Factors ice            [facility] PT Assessment - 08/06/15 0001    Observation/Other Assessments   Focus on Therapeutic Outcomes (FOTO)  46% disability   Strength   Left Hip Flexion 3-/5   Left Knee Flexion 4+/5   Left Knee Extension 3+/5                     [facility] Adult PT Treatment/Exercise - 08/06/15 0001    Knee/Hip Exercises: Standing   Gait Training cues for L hip ext, flexion in swing through, heel strike and speed   Other Standing Knee Exercises weight shift A/P with L in front (UE support on counter)   Knee/Hip Exercises: Supine  Short Arc [employer] 15 reps  5s holds   Straight Leg Raises 20 reps   Cryotherapy   Number Minutes Cryotherapy 10 Minutes   Cryotherapy Location Knee  L with LE elevated   Type of Cryotherapy Ice pack   Manual Therapy   Manual Therapy Taping  by [facility]   Kinesiotex Edema;Facilitate Muscle   Kinesiotix   Edema lateral & medial knee   Facilitate Muscle  quads                PT Education - 08/06/15 0824    Education provided Yes   Education Details progress with goals, exercise form/rationale, POC   Person(s) Educated Patient   Methods Explanation;Demonstration;Tactile cues;Verbal cues   Comprehension Verbalized understanding;Returned demonstration;Verbal cues required;Tactile cues required;Need further instruction          PT Short Term Goals - 08/04/15 0934    PT SHORT TERM GOAL #1   Title Decreased average pain levels <=3/10 by 07/31/15   Baseline 0-3/10   during the day, worse at night increasing up to 8/10   Status On-going   PT SHORT TERM GOAL #2   Title extension to 0 deg, flexion to 120 deg by 07/31/15   Baseline pt with -4 degrees extension L knee, 112 degrees flexion L knee   Time 4   Status On-going   PT SHORT TERM GOAL #3   Title able to demonstrate appropriate gait pattern without rolling walker.    Baseline decreased WB in LLE using SPC   Time 4           PT Long Term Goals - 07/14/15 1218    PT LONG TERM GOAL #1   Title FOTO to 60% ability by 08/28/15   Status On-going   PT LONG TERM GOAL #2   Title hip and knee MMT 4+/5 on L in all directions   Status On-going   PT LONG TERM GOAL #3   Title able to indepenently navigate steps without need for UE support    Status On-going   PT LONG TERM GOAL #4   Title able to walk/stand for 1 hour without needing to sit down.    Status On-going               Plan - 08/06/15 0849    Clinical Impression Statement Improving strength but cont to lack full appropraite quad control and functional ROM. Focused on training gait pattern today with activation of quad and appropriate swing through. Will continue to benefit from skilled PT to improve muscular control around knee to avoid falls in walking as well as reduce edema in joint.    PT Treatment/Interventions ADLs/Self Care Home Management;Cryotherapy;Electrical Stimulation;Moist Heat;Gait training;Stair training;Functional mobility training;Therapeutic activities;Therapeutic exercise;Balance training;Patient/family education;Neuromuscular re-education;Manual techniques;Vasopneumatic Device;Passive range of motion   PT Next Visit Plan quad & hip strenghening, decrease edema; continue to progress exercises; if swelling stay under control consider squats and low steps; standing ther-ex   PT Home Exercise Plan seated hamstring stretch, lateral weight shifts, ice 3-4 times/day 10-15 min ea with knee suspended. added total joint gait  exericises with quad /glut activation at wall   Consulted and Agree with Plan of Care Patient      Patient will benefit from skilled therapeutic intervention in order to improve the following deficits and impairments:  Increased edema, Decreased endurance, Difficulty walking, Decreased mobility, Decreased balance, Decreased activity tolerance, Decreased strength  Visit Diagnosis: Pain in left knee  Muscle weakness (generalized)  Difficulty in walking, not elsewhere classified     Problem List Patient Active Problem List   Diagnosis Date Noted  . Diabetes mellitus with neuropathy ([place]) 06/30/2015  . Status post total left knee replacement 06/10/2015  . Diabetic neuropathy ([place]) 08/28/2013  . Testosterone deficiency 07/27/2010  . Dyslipidemia 07/27/2010  . HEADACHE 11/10/2009  . CORNS AND CALLUSES 07/17/2008  . Type 2 diabetes mellitus without complication ([place]) 0000000  . ACTION TREMOR 12/07/2006  . Osteoarthritis 12/07/2006  . PARESTHESIA 12/07/2006    Amery Vandenbos C. Yulanda Diggs PT, DPT 08/06/2015 9:07 AM   [facility] [address] [place], [place], [address] Phone: [phone]   Fax:  [phone]  Name: Tyler Greer MRN: [id number] Date of Birth: Apr 12, 1935

## 2015-08-10 DIAGNOSIS — R350 Frequency of micturition: Secondary | ICD-10-CM | POA: Diagnosis not present

## 2015-08-10 DIAGNOSIS — R351 Nocturia: Secondary | ICD-10-CM | POA: Diagnosis not present

## 2015-08-10 DIAGNOSIS — N401 Enlarged prostate with lower urinary tract symptoms: Secondary | ICD-10-CM | POA: Diagnosis not present

## 2015-08-12 ENCOUNTER — Encounter: Payer: Self-pay | Admitting: Physical Therapy

## 2015-08-12 ENCOUNTER — Ambulatory Visit: Payer: Medicare Other | Admitting: Physical Therapy

## 2015-08-12 DIAGNOSIS — M25562 Pain in left knee: Secondary | ICD-10-CM

## 2015-08-12 DIAGNOSIS — R262 Difficulty in walking, not elsewhere classified: Secondary | ICD-10-CM | POA: Diagnosis not present

## 2015-08-12 DIAGNOSIS — M6281 Muscle weakness (generalized): Secondary | ICD-10-CM

## 2015-08-12 NOTE — Therapy (Signed)
[place], [place], [address] Phone: [phone]   Fax:  [phone]  Physical Therapy Treatment  Patient Details  Name: Tyler Greer MRN: [id number] Date of Birth: 29-Nov-1935 Referring Provider: Rodell Perna, MD  Encounter Date: 08/12/2015      PT End of Session - 08/12/15 0758    Visit Number 12   Number of Visits 17   Date for PT Re-Evaluation 08/28/15   Authorization Type MCR KX at visit 15   PT Start Time 0800   PT Stop Time 0855   PT Time Calculation (min) 55 min   Activity Tolerance Patient tolerated treatment well   Behavior During Therapy [facility] for tasks assessed/performed      Past Medical History  Diagnosis Date  . Intention tremor   . Paresthesia of foot     bilateral  . Stroke ([place]) 2005    mini stroke  . Diabetes mellitus type II 10/2007  . Migraine     "none in years" (06/10/2015)  . Osteoarthritis   . Arthritis     "hands and knees" (06/10/2015)  . Basal cell carcinoma, arm     "both"  . Basal cell carcinoma, face     Past Surgical History  Procedure Laterality Date  . Tonsillectomy    . Lumbar laminectomy  X 2  . Knee arthroscopy Bilateral 2002  . Hammer toe surgery Bilateral   . Joint replacement    . Back surgery    . Total knee arthroplasty Left 06/10/2015  . Knee arthroplasty Left 06/10/2015    Procedure: COMPUTER ASSISTED TOTAL KNEE ARTHROPLASTY;  Surgeon: Marybelle Killings, MD;  Location: [facility];  Service: Orthopedics;  Laterality: Left;    There were no vitals filed for this visit.      Subjective Assessment - 08/12/15 0803    Subjective Pt reports he is back on pain pills, had to elevate yesterday due to increased swelling. Pt reports he is not icing and elevating throughout the day. Reports he put on some heavy work boots for a couple of hours yesterday which made knee very sore.  Reports standing for almost an hour at [employer] which increased pain to severe but did  not ask for a chair.    Currently in Pain? Yes   Pain Score 4    Pain Location Knee   Pain Orientation Left   Pain Descriptors / Indicators Sore   Pain Type Surgical pain   Aggravating Factors  walking, not elevating   Pain Relieving Factors ice, elevation                         [facility] Adult PT Treatment/Exercise - 08/12/15 0001    Knee/Hip Exercises: Stretches   Passive Hamstring Stretch 2 reps;30 seconds   Knee/Hip Exercises: Aerobic   Nustep 5 min L6   Knee/Hip Exercises: Standing   Heel Raises 20 reps;10 reps   Heel Raises Limitations alt heel-toe raises   Other Standing Knee Exercises weight shift A/P and tandem 1 min ea   Knee/Hip Exercises: Seated   Sit to Sand 10 reps  seated on airex   Knee/Hip Exercises: Supine   Short Arc Quad Sets 20 reps;10 reps   Bridges Limitations 20x5s holds   Straight Leg Raises 10 reps  pain   Knee/Hip Exercises: Sidelying   Clams x30 ea   Cryotherapy   Number Minutes Cryotherapy 10 Minutes   Cryotherapy Location Knee  Type of Cryotherapy Ice pack   Manual Therapy   Kinesiotex Edema;Facilitate Muscle   Kinesiotix   Edema lateral & medial knee   Facilitate Muscle  quads                PT Education - 08/12/15 0805    Education provided Yes   Education Details exercise form/rationale; importance of ice and elevation multiple times daily.   Person(s) Educated Patient   Methods Explanation;Demonstration;Tactile cues;Verbal cues   Comprehension Verbalized understanding;Returned demonstration;Verbal cues required;Tactile cues required;Need further instruction          PT Short Term Goals - 08/04/15 0934    PT SHORT TERM GOAL #1   Title Decreased average pain levels <=3/10 by 07/31/15   Baseline 0-3/10  during the day, worse at night increasing up to 8/10   Status On-going   PT SHORT TERM GOAL #2   Title extension to 0 deg, flexion to 120 deg by 07/31/15   Baseline pt with -4 degrees extension L knee, 112  degrees flexion L knee   Time 4   Status On-going   PT SHORT TERM GOAL #3   Title able to demonstrate appropriate gait pattern without rolling walker.    Baseline decreased WB in LLE using SPC   Time 4           PT Long Term Goals - 07/14/15 1218    PT LONG TERM GOAL #1   Title FOTO to 60% ability by 08/28/15   Status On-going   PT LONG TERM GOAL #2   Title hip and knee MMT 4+/5 on L in all directions   Status On-going   PT LONG TERM GOAL #3   Title able to indepenently navigate steps without need for UE support    Status On-going   PT LONG TERM GOAL #4   Title able to walk/stand for 1 hour without needing to sit down.    Status On-going               Plan - 08/12/15 0849    Clinical Impression Statement added more [facility] activities today which were less painful to knee than [place]. continues to demonstrate quad lag and mildly antalgic gait with SPC. Discussion held on importance of ice, elevation and resting knee to decrease edema. 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    PT Next Visit Plan quad & hip strenghening, decrease edema; continue to progress exercises; if swelling stay under control consider squats and low steps; standing ther-ex   PT Home Exercise Plan seated hamstring stretch, lateral weight shifts, ice 3-4 times/day 10-15 min ea with knee suspended. added total joint gait exericises with quad /glut activation at wall   Consulted and Agree with Plan of Care Patient      Patient will benefit from skilled therapeutic intervention in order to improve the following deficits and impairments:     Visit Diagnosis: Pain in left knee  Muscle weakness (generalized)  Difficulty in walking, not elsewhere classified     Problem List Patient Active Problem List   Diagnosis Date Noted  . Diabetes mellitus with neuropathy ([place])  06/30/2015  . Status post total left knee replacement 06/10/2015  . Diabetic neuropathy ([place]) 08/28/2013  . Testosterone deficiency 07/27/2010  . Dyslipidemia 07/27/2010  . HEADACHE 11/10/2009  . CORNS AND CALLUSES 07/17/2008  . Type 2 diabetes  mellitus without complication ([place]) 0000000  . ACTION TREMOR 12/07/2006  . Osteoarthritis 12/07/2006  . PARESTHESIA 12/07/2006    Tyona Nilsen C. Brinley Treanor PT, DPT 08/12/2015 8:52 AM   [facility] [address] [place], [place], [address] Phone: [phone]   Fax:  [phone]  Name: LESLY VIEYRA MRN: [id number] Date of Birth: 09-Dec-1935

## 2015-08-13 ENCOUNTER — Ambulatory Visit: Payer: Medicare Other | Admitting: Physical Therapy

## 2015-08-13 DIAGNOSIS — M6281 Muscle weakness (generalized): Secondary | ICD-10-CM | POA: Diagnosis not present

## 2015-08-13 DIAGNOSIS — R262 Difficulty in walking, not elsewhere classified: Secondary | ICD-10-CM

## 2015-08-13 DIAGNOSIS — M25562 Pain in left knee: Secondary | ICD-10-CM

## 2015-08-13 NOTE — Therapy (Signed)
[place], [place], [address] Phone: [phone]   Fax:  [phone]  Physical Therapy Treatment  Patient Details  Name: Tyler Greer MRN: [id number] Date of Birth: 05/02/1935 Referring Provider: Rodell Perna, MD  Encounter Date: 08/13/2015      PT End of Session - 08/13/15 1128    Visit Number 13   Number of Visits 17   Date for PT Re-Evaluation 08/28/15   Authorization Type MCR KX at visit 15   PT Start Time 1018   PT Stop Time 1110   PT Time Calculation (min) 52 min      Past Medical History  Diagnosis Date  . Intention tremor   . Paresthesia of foot     bilateral  . Stroke ([place]) 2005    mini stroke  . Diabetes mellitus type II 10/2007  . Migraine     "none in years" (06/10/2015)  . Osteoarthritis   . Arthritis     "hands and knees" (06/10/2015)  . Basal cell carcinoma, arm     "both"  . Basal cell carcinoma, face     Past Surgical History  Procedure Laterality Date  . Tonsillectomy    . Lumbar laminectomy  X 2  . Knee arthroscopy Bilateral 2002  . Hammer toe surgery Bilateral   . Joint replacement    . Back surgery    . Total knee arthroplasty Left 06/10/2015  . Knee arthroplasty Left 06/10/2015    Procedure: COMPUTER ASSISTED TOTAL KNEE ARTHROPLASTY;  Surgeon: Marybelle Killings, MD;  Location: [facility];  Service: Orthopedics;  Laterality: Left;    There were no vitals filed for this visit.      Subjective Assessment - 08/13/15 1023    Subjective I tried icing and elevating 3 times per day like she recommended and it has helped. I am still taking a pain pill and that helps.    Currently in Pain? Yes   Pain Score 3   or 4/10   Pain Location Knee   Pain Orientation Left                         [facility] Adult PT Treatment/Exercise - 08/13/15 0001    Knee/Hip Exercises: Machines for Strengthening   Cybex Leg Press 2 plates bilateral , added ball for alignment, x 20., 1  plate LLE only  579FGE smaller ROM    Knee/Hip Exercises: Standing   Knee Flexion 2 sets;10 reps   Knee Flexion Limitations 3#   SLS with Vectors 3 way hip with SLS on LLE and RUE support - increased pain in LUE    Other Standing Knee Exercises tandem stance without UE support x 45 sec LLE back   Knee/Hip Exercises: Seated   Long Arc Quad Left;20 reps;Weights   Long Arc Quad Weight 5 lbs.  5 sec hold   Knee/Hip Exercises: Supine   Short Arc Quad Sets 20 reps;10 reps   Short Arc Quad Sets Limitations 5#   Cryotherapy   Number Minutes Cryotherapy 10 Minutes   Cryotherapy Location Knee                PT Education - 08/12/15 0805    Education provided Yes   Education Details exercise form/rationale; importance of ice and elevation multiple times daily.   Person(s) Educated Patient   Methods Explanation;Demonstration;Tactile cues;Verbal cues   Comprehension Verbalized understanding;Returned demonstration;Verbal cues required;Tactile cues required;Need further instruction  PT Short Term Goals - 08/04/15 0934    PT SHORT TERM GOAL #1   Title Decreased average pain levels <=3/10 by 07/31/15   Baseline 0-3/10  during the day, worse at night increasing up to 8/10   Status On-going   PT SHORT TERM GOAL #2   Title extension to 0 deg, flexion to 120 deg by 07/31/15   Baseline pt with -4 degrees extension L knee, 112 degrees flexion L knee   Time 4   Status On-going   PT SHORT TERM GOAL #3   Title able to demonstrate appropriate gait pattern without rolling walker.    Baseline decreased WB in LLE using SPC   Time 4           PT Long Term Goals - 07/14/15 1218    PT LONG TERM GOAL #1   Title FOTO to 60% ability by 08/28/15   Status On-going   PT LONG TERM GOAL #2   Title hip and knee MMT 4+/5 on L in all directions   Status On-going   PT LONG TERM GOAL #3   Title able to indepenently navigate steps without need for UE support    Status On-going   PT LONG TERM GOAL  #4   Title able to walk/stand for 1 hour without needing to sit down.    Status On-going               Plan - 08/13/15 1130    Clinical Impression Statement Pt reports ice and elevation has been helpful. Pt able to perform unassisted full knee extensions on leg press with LUE only. Increased pain with Left Weight bearing exercises today. Continued quad lag with LAQs. Pt reports one episode of Leg giving away when he did not have his SPC. He was able to catch himself wiht RUE.    PT Next Visit Plan quad & hip strenghening, decrease edema; continue to progress exercises; if swelling stay under control consider squats and low steps; standing ther-ex      Patient will benefit from skilled therapeutic intervention in order to improve the following deficits and impairments:  Increased edema, Decreased endurance, Difficulty walking, Decreased mobility, Decreased balance, Decreased activity tolerance, Decreased strength  Visit Diagnosis: Pain in left knee  Muscle weakness (generalized)  Difficulty in walking, not elsewhere classified     Problem List Patient Active Problem List   Diagnosis Date Noted  . Diabetes mellitus with neuropathy ([place]) 06/30/2015  . Status post total left knee replacement 06/10/2015  . Diabetic neuropathy ([place]) 08/28/2013  . Testosterone deficiency 07/27/2010  . Dyslipidemia 07/27/2010  . HEADACHE 11/10/2009  . CORNS AND CALLUSES 07/17/2008  . Type 2 diabetes mellitus without complication ([place]) 0000000  . ACTION TREMOR 12/07/2006  . Osteoarthritis 12/07/2006  . PARESTHESIA 12/07/2006    Dorene Ar, PTA 08/13/2015, 11:34 AM  [facility] [address] [place], [place], [address] Phone: [phone]   Fax:  [phone]  Name: Tyler Greer MRN: [id number] Date of Birth: Jul 14, 1935

## 2015-08-18 ENCOUNTER — Ambulatory Visit: Payer: Medicare Other | Admitting: Physical Therapy

## 2015-08-18 DIAGNOSIS — M25562 Pain in left knee: Secondary | ICD-10-CM

## 2015-08-18 DIAGNOSIS — M6281 Muscle weakness (generalized): Secondary | ICD-10-CM | POA: Diagnosis not present

## 2015-08-18 DIAGNOSIS — R262 Difficulty in walking, not elsewhere classified: Secondary | ICD-10-CM

## 2015-08-18 NOTE — Therapy (Signed)
[facility] [address] [place], [place], [id number] Phone: [phone]   Fax:  [phone]  Physical Therapy Treatment  Patient Details  Name: Tyler Greer MRN: [id number] Date of Birth: 19-Jul-1935 Referring Provider: Annell GreeningMark Yates, MD  Encounter Date: 08/18/2015      PT End of Session - 08/18/15 1136    Visit Number 14   Number of Visits 17   Date for PT Re-Evaluation 08/28/15   Authorization Type MCR KX at visit 15   PT Start Time 1017   PT Stop Time 1110   PT Time Calculation (min) 53 min      Past Medical History  Diagnosis Date  . Intention tremor   . Paresthesia of foot     bilateral  . Stroke ([facility]) 2005    mini stroke  . Diabetes mellitus type II 10/2007  . Migraine     "none in years" (06/10/2015)  . Osteoarthritis   . Arthritis     "hands and knees" (06/10/2015)  . Basal cell carcinoma, arm     "both"  . Basal cell carcinoma, face     Past Surgical History  Procedure Laterality Date  . Tonsillectomy    . Lumbar laminectomy  X 2  . Knee arthroscopy Bilateral 2002  . Hammer toe surgery Bilateral   . Joint replacement    . Back surgery    . Total knee arthroplasty Left 06/10/2015  . Knee arthroplasty Left 06/10/2015    Procedure: COMPUTER ASSISTED TOTAL KNEE ARTHROPLASTY;  Surgeon: Eldred MangesMark C Yates, MD;  [facility];  Service: Orthopedics;  Laterality: Left;    There were no vitals filed for this visit.      Subjective Assessment - 08/18/15 1135    Currently in Pain? Yes   Pain Score 3    Pain Location Knee   Pain Orientation Left                         [facility] Adult PT Treatment/Exercise - 08/18/15 0001    Knee/Hip Exercises: Machines for Strengthening   Cybex Leg Press 2 plates bilateral , added ball for alignment, x 20., 1 plate LLE only  2x10 smaller ROM    Other Machine Reforrmer used for foot work bilateral and single leg multiple reps    Knee/Hip Exercises: Standing   SLS 18 sec best CGA, worked up to this with 1 finger support gaining confidence   Knee/Hip Exercises: Seated   Sit to [employer] 10 reps  focusing on equal weight   Cryotherapy   Number Minutes Cryotherapy 10 Minutes   Cryotherapy Location Knee   Type of Cryotherapy Ice pack    Nustep L5 x 8 minutes LE only              PT Short Term Goals - 08/04/15 0934    PT SHORT TERM GOAL #1   Title Decreased average pain levels <=3/10 by 07/31/15   Baseline 0-3/10  during the day, worse at night increasing up to 8/10   Status On-going   PT SHORT TERM GOAL #2   Title extension to 0 deg, flexion to 120 deg by 07/31/15   Baseline pt with -4 degrees extension L knee, 112 degrees flexion L knee   Time 4   Status On-going   PT SHORT TERM GOAL #3   Title able to demonstrate appropriate gait pattern without rolling walker.    Baseline decreased WB in LLE  using SPC   Time 4           PT Long Term Goals - 07/14/15 1218    PT LONG TERM GOAL #1   Title FOTO to 60% ability by 08/28/15   Status On-going   PT LONG TERM GOAL #2   Title hip and knee MMT 4+/5 on L in all directions   Status On-going   PT LONG TERM GOAL #3   Title able to indepenently navigate steps without need for UE support    Status On-going   PT LONG TERM GOAL #4   Title able to walk/stand for 1 hour without needing to sit down.    Status On-going               Plan - 08/18/15 1133    Clinical Impression Statement Continued quad strength today with trial on Pilates Reformer. Pt did well with unilateral foot work and bilateral bridging. Some increased pain with end range flexion. Also worked on [employer] with pt able to advance to 18 sec on LLE. Began sit-stands with cues to shift weight onto LLE. pt tends to keep LLE extended during transfers. He is able to ascend stairs reciprocally however needs step to gait for descent per his report. Some progress toward stair goals.    PT Next Visit Plan Continue closed chain, and work on  step ups, squats , sit-stands with equal weight bearing      Patient will benefit from skilled therapeutic intervention in order to improve the following deficits and impairments:  Increased edema, Decreased endurance, Difficulty walking, Decreased mobility, Decreased balance, Decreased activity tolerance, Decreased strength  Visit Diagnosis: Pain in left knee  Muscle weakness (generalized)  Difficulty in walking, not elsewhere classified     Problem List Patient Active Problem List   Diagnosis Date Noted  . Diabetes mellitus with neuropathy ([place]) 06/30/2015  . Status post total left knee replacement 06/10/2015  . Diabetic neuropathy ([place]) 08/28/2013  . Testosterone deficiency 07/27/2010  . Dyslipidemia 07/27/2010  . HEADACHE 11/10/2009  . CORNS AND CALLUSES 07/17/2008  . Type 2 diabetes mellitus without complication ([place]) 0000000  . ACTION TREMOR 12/07/2006  . Osteoarthritis 12/07/2006  . PARESTHESIA 12/07/2006    Dorene Ar, PTA 08/18/2015, 1:24 PM  [facility] [address] [place], [place], [address] Phone: [phone]   Fax:  [phone]  Name: Tyler Greer MRN: [id number] Date of Birth: 1935-10-13

## 2015-08-20 ENCOUNTER — Ambulatory Visit: Payer: Medicare Other | Admitting: Physical Therapy

## 2015-08-20 ENCOUNTER — Encounter: Payer: Self-pay | Admitting: Physical Therapy

## 2015-08-20 DIAGNOSIS — M6281 Muscle weakness (generalized): Secondary | ICD-10-CM | POA: Diagnosis not present

## 2015-08-20 DIAGNOSIS — R262 Difficulty in walking, not elsewhere classified: Secondary | ICD-10-CM | POA: Diagnosis not present

## 2015-08-20 DIAGNOSIS — M25562 Pain in left knee: Secondary | ICD-10-CM

## 2015-08-20 NOTE — Therapy (Signed)
[place], [place], [address] Phone: [phone]   Fax:  [phone]  Physical Therapy Treatment  Patient Details  Name: Tyler Greer MRN: [id number] Date of Birth: 1935/11/06 Referring Provider: Rodell Perna, MD  Encounter Date: 08/20/2015      PT End of Session - 08/20/15 1022    Visit Number 15   Number of Visits 19   Date for PT Re-Evaluation 09/03/15   Authorization Type MCR KX at visit 15   PT Start Time 1018   PT Stop Time 1107   PT Time Calculation (min) 49 min   Activity Tolerance Patient tolerated treatment well   Behavior During Therapy [facility] for tasks assessed/performed      Past Medical History  Diagnosis Date  . Intention tremor   . Paresthesia of foot     bilateral  . Stroke ([place]) 2005    mini stroke  . Diabetes mellitus type II 10/2007  . Migraine     "none in years" (06/10/2015)  . Osteoarthritis   . Arthritis     "hands and knees" (06/10/2015)  . Basal cell carcinoma, arm     "both"  . Basal cell carcinoma, face     Past Surgical History  Procedure Laterality Date  . Tonsillectomy    . Lumbar laminectomy  X 2  . Knee arthroscopy Bilateral 2002  . Hammer toe surgery Bilateral   . Joint replacement    . Back surgery    . Total knee arthroplasty Left 06/10/2015  . Knee arthroplasty Left 06/10/2015    Procedure: COMPUTER ASSISTED TOTAL KNEE ARTHROPLASTY;  Surgeon: Marybelle Killings, MD;  Location: [place];  Service: Orthopedics;  Laterality: Left;    There were no vitals filed for this visit.      Subjective Assessment - 08/20/15 1021    Subjective Pt reports very low pain today, more-so just soreness. Rested most of the day yesterday. Was sore after picking blueberries on Tuesday   Patient Stated Goals able to sit with knee flexed comfortably in seated. 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 Yes   Pain Score 1    Pain Location Knee   Pain Orientation  Left   Pain Descriptors / Indicators Sore            [facility] PT Assessment - 08/20/15 0001    AROM   Left Knee Extension 0   Left Knee Flexion 105   Strength   Left Hip Flexion --  7 deg quad lag in SLR   Left Knee Flexion 5/5   Left Knee Extension 4+/5                     [facility] Adult PT Treatment/Exercise - 08/20/15 0001    Knee/Hip Exercises: Standing   Heel Raises 20 reps   Heel Raises Limitations alt heel-toe   Forward Step Up Step Height: 2";20 reps;Hand Hold: 2   SLS while throwing tennis ball at wall  support on R toes   Knee/Hip Exercises: Seated   Long Arc Quad 20 reps  quick up & hold with eccentric lowering   Sit to Sand 20 reps  sitting to tall table, cues for quad control                PT Education - 08/20/15 1022    Education provided Yes   Education Details exercise form/rationale   Person(s) Educated Patient   Methods  Explanation;Demonstration;Tactile cues;Verbal cues   Comprehension Verbalized understanding;Returned demonstration;Verbal cues required;Tactile cues required;Need further instruction          PT Short Term Goals - 08/20/15 1024    PT SHORT TERM GOAL #1   Title Decreased average pain levels <=3/10 by 07/31/15   Baseline approx 4/10   Time 4   Period Weeks   Status On-going   PT SHORT TERM GOAL #2   Title extension to 0 deg, flexion to 120 deg by 07/31/15   Baseline 0-105 6/29   Time 4   Period Weeks   Status Partially Met   PT SHORT TERM GOAL #3   Title able to demonstrate appropriate gait pattern without rolling walker.    Time 4   Period Weeks   Status Achieved           PT Long Term Goals - 08/20/15 1028    PT LONG TERM GOAL #2   Title hip and knee MMT 4+/5 on L in all directions   Baseline all 4+/5 except 7 deg ext lag in long axis hip flx   Time 8   Period Weeks   Status Partially Met   PT LONG TERM GOAL #3   Title able to indepenently navigate steps without need for UE support    Baseline  able to demonstrate step over step with use of UE for support   Time 8   Period Weeks   Status On-going   PT LONG TERM GOAL #4   Title able to walk/stand for 1 hour without needing to sit down.    Status Achieved               Plan - 08/20/15 1258    Clinical Impression Statement Pt has made good progress toward goals. Increased challenge for weight bearing and control through RLE as well as stair climbing which is still weak and unsafe.   PT Next Visit Plan steps, SLS    PT Home Exercise Plan seated hamstring stretch, lateral weight shifts, ice 3-4 times/day 10-15 min ea with knee suspended. added total joint gait exericises with quad /glut activation at wall   Consulted and Agree with Plan of Care Patient      Patient will benefit from skilled therapeutic intervention in order to improve the following deficits and impairments:     Visit Diagnosis: Pain in left knee  Muscle weakness (generalized)  Difficulty in walking, not elsewhere classified     Problem List Patient Active Problem List   Diagnosis Date Noted  . Diabetes mellitus with neuropathy ([place]) 06/30/2015  . Status post total left knee replacement 06/10/2015  . Diabetic neuropathy ([place]) 08/28/2013  . Testosterone deficiency 07/27/2010  . Dyslipidemia 07/27/2010  . HEADACHE 11/10/2009  . CORNS AND CALLUSES 07/17/2008  . Type 2 diabetes mellitus without complication ([place]) 30/86/5784  . ACTION TREMOR 12/07/2006  . Osteoarthritis 12/07/2006  . PARESTHESIA 12/07/2006    Gram Siedlecki C. Delrose Rohwer PT, DPT 08/20/2015 1:04 PM   [facility] [address] [place], [place], [address] Phone: [phone]   Fax:  [phone]  Name: Tyler Greer MRN: [id number] Date of Birth: [birth year]

## 2015-08-26 ENCOUNTER — Encounter: Payer: Self-pay | Admitting: Physical Therapy

## 2015-08-26 ENCOUNTER — Ambulatory Visit: Payer: Medicare Other | Attending: Orthopaedic Surgery | Admitting: Physical Therapy

## 2015-08-26 DIAGNOSIS — R262 Difficulty in walking, not elsewhere classified: Secondary | ICD-10-CM | POA: Insufficient documentation

## 2015-08-26 DIAGNOSIS — M25562 Pain in left knee: Secondary | ICD-10-CM | POA: Diagnosis not present

## 2015-08-26 DIAGNOSIS — M6281 Muscle weakness (generalized): Secondary | ICD-10-CM | POA: Diagnosis not present

## 2015-08-26 NOTE — Therapy (Signed)
[place], [place], [address] Phone: [phone]   Fax:  [phone]  Physical Therapy Treatment  Patient Details  Name: Tyler Greer MRN: [id number] Date of Birth: Jul 12, 1935 Referring Provider: Rodell Perna, MD  Encounter Date: 08/26/2015      PT End of Session - 08/26/15 0807    Visit Number 16   Number of Visits 19   Date for PT Re-Evaluation 09/03/15   Authorization Type MCR KX at visit 15   PT Start Time 0800   PT Stop Time 0853   PT Time Calculation (min) 53 min   Activity Tolerance Patient tolerated treatment well   Behavior During Therapy [facility] for tasks assessed/performed      Past Medical History  Diagnosis Date  . Intention tremor   . Paresthesia of foot     bilateral  . Stroke ([place]) 2005    mini stroke  . Diabetes mellitus type II 10/2007  . Migraine     "none in years" (06/10/2015)  . Osteoarthritis   . Arthritis     "hands and knees" (06/10/2015)  . Basal cell carcinoma, arm     "both"  . Basal cell carcinoma, face     Past Surgical History  Procedure Laterality Date  . Tonsillectomy    . Lumbar laminectomy  X 2  . Knee arthroscopy Bilateral 2002  . Hammer toe surgery Bilateral   . Joint replacement    . Back surgery    . Total knee arthroplasty Left 06/10/2015  . Knee arthroplasty Left 06/10/2015    Procedure: COMPUTER ASSISTED TOTAL KNEE ARTHROPLASTY;  Surgeon: Marybelle Killings, MD;  Location: [place];  Service: Orthopedics;  Laterality: Left;    There were no vitals filed for this visit.      Subjective Assessment - 08/26/15 0759    Subjective Pt reports he can do just about everything except the knee doesnt bend as far as the other one. Is not using his cane, just feels it when he begins walking too fast.    Currently in Pain? Yes   Pain Score 2    Pain Location Knee   Pain Orientation Left   Pain Type Surgical pain   Pain Frequency Intermittent   Aggravating  Factors  walking too fast or too much                         [facility] Adult PT Treatment/Exercise - 08/26/15 0001    Knee/Hip Exercises: Stretches   Passive Hamstring Stretch 2 reps;30 seconds   Other Knee/Hip Stretches seated knee flx stretch 2x30s   Knee/Hip Exercises: Aerobic   Nustep 5 min L6   Knee/Hip Exercises: Standing   Heel Raises 20 reps   Heel Raises Limitations alt heel-toe   Forward Step Up Step Height: 4";2 sets;10 reps   SLS 3x1 min   Knee/Hip Exercises: Seated   Sit to Sand 2 sets;10 reps   Knee/Hip Exercises: Supine   Bridges Limitations x30   Knee/Hip Exercises: Sidelying   Hip ABduction Limitations x30   Cryotherapy   Number Minutes Cryotherapy 10 Minutes   Cryotherapy Location Knee   Type of Cryotherapy Ice pack                PT Education - 08/26/15 0807    Education provided Yes   Education Details exercise form/rationale, POC   Person(s) Educated Patient   Methods Explanation;Demonstration;Tactile cues;Verbal cues  Comprehension Verbalized understanding;Returned demonstration;Verbal cues required;Tactile cues required;Need further instruction          PT Short Term Goals - 08/20/15 1024    PT SHORT TERM GOAL #1   Title Decreased average pain levels <=3/10 by 07/31/15   Baseline approx 4/10   Time 4   Period Weeks   Status On-going   PT SHORT TERM GOAL #2   Title extension to 0 deg, flexion to 120 deg by 07/31/15   Baseline 0-105 6/29   Time 4   Period Weeks   Status Partially Met   PT SHORT TERM GOAL #3   Title able to demonstrate appropriate gait pattern without rolling walker.    Time 4   Period Weeks   Status Achieved           PT Long Term Goals - 08/20/15 1028    PT LONG TERM GOAL #2   Title hip and knee MMT 4+/5 on L in all directions   Baseline all 4+/5 except 7 deg ext lag in long axis hip flx   Time 8   Period Weeks   Status Partially Met   PT LONG TERM GOAL #3   Title able to indepenently  navigate steps without need for UE support    Baseline able to demonstrate step over step with use of UE for support   Time 8   Period Weeks   Status On-going   PT LONG TERM GOAL #4   Title able to walk/stand for 1 hour without needing to sit down.    Status Achieved               Plan - 08/26/15 0848    Clinical Impression Statement Lacks endurance to hold SLS for 1 min and requires verbal and tactile cues to avoid favoring R side in CKC activities.    PT Next Visit Plan stair navigation, CKC, D/C 7/13   PT Home Exercise Plan seated hamstring stretch, ice 3-4 times/day 10-15 min ea with knee suspended. bridges, SL hip abduction, heel/toe raises, SLS, sit to stand   Consulted and Agree with Plan of Care Patient      Patient will benefit from skilled therapeutic intervention in order to improve the following deficits and impairments:     Visit Diagnosis: Pain in left knee  Muscle weakness (generalized)  Difficulty in walking, not elsewhere classified     Problem List Patient Active Problem List   Diagnosis Date Noted  . Diabetes mellitus with neuropathy ([place]) 06/30/2015  . Status post total left knee replacement 06/10/2015  . Diabetic neuropathy ([place]) 08/28/2013  . Testosterone deficiency 07/27/2010  . Dyslipidemia 07/27/2010  . HEADACHE 11/10/2009  . CORNS AND CALLUSES 07/17/2008  . Type 2 diabetes mellitus without complication ([place]) 03/00/9233  . ACTION TREMOR 12/07/2006  . Osteoarthritis 12/07/2006  . PARESTHESIA 12/07/2006    Keyundra Fant C. Betrice Wanat PT, DPT 08/26/2015 8:51 AM   [facility] [address] [place], [place], [address] Phone: [phone]   Fax:  [phone]  Name: KHYRON GARNO MRN: [id number] Date of Birth: 1935/05/26

## 2015-08-27 ENCOUNTER — Ambulatory Visit: Payer: Medicare Other | Admitting: Physical Therapy

## 2015-08-27 ENCOUNTER — Encounter: Payer: Self-pay | Admitting: Physical Therapy

## 2015-08-27 DIAGNOSIS — M6281 Muscle weakness (generalized): Secondary | ICD-10-CM | POA: Diagnosis not present

## 2015-08-27 DIAGNOSIS — R262 Difficulty in walking, not elsewhere classified: Secondary | ICD-10-CM | POA: Diagnosis not present

## 2015-08-27 DIAGNOSIS — M25562 Pain in left knee: Secondary | ICD-10-CM

## 2015-08-27 NOTE — Therapy (Signed)
[place], [place], [address] Phone: [phone]   Fax:  [phone]  Physical Therapy Treatment  Patient Details  Name: Tyler Greer MRN: [id number] Date of Birth: 1935-06-07 Referring Provider: Rodell Perna, MD  Encounter Date: 08/27/2015      PT End of Session - 08/27/15 1020    Visit Number 17   Number of Visits 19   Date for PT Re-Evaluation 09/03/15   Authorization Type MCR KX at visit 15   PT Start Time 1015   PT Stop Time 1107   PT Time Calculation (min) 52 min   Activity Tolerance Patient tolerated treatment well   Behavior During Therapy [facility] for tasks assessed/performed      Past Medical History  Diagnosis Date  . Intention tremor   . Paresthesia of foot     bilateral  . Stroke ([place]) 2005    mini stroke  . Diabetes mellitus type II 10/2007  . Migraine     "none in years" (06/10/2015)  . Osteoarthritis   . Arthritis     "hands and knees" (06/10/2015)  . Basal cell carcinoma, arm     "both"  . Basal cell carcinoma, face     Past Surgical History  Procedure Laterality Date  . Tonsillectomy    . Lumbar laminectomy  X 2  . Knee arthroscopy Bilateral 2002  . Hammer toe surgery Bilateral   . Joint replacement    . Back surgery    . Total knee arthroplasty Left 06/10/2015  . Knee arthroplasty Left 06/10/2015    Procedure: COMPUTER ASSISTED TOTAL KNEE ARTHROPLASTY;  Surgeon: Marybelle Killings, MD;  Location: [place];  Service: Orthopedics;  Laterality: Left;    There were no vitals filed for this visit.      Subjective Assessment - 08/27/15 1018    Subjective Went home and did more stairs yesterday and reports he "may have overdone it"  Has some soreness today.    Patient Stated Goals able to sit with knee flexed comfortably in seated. 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 Yes   Pain Score 4    Pain Location Knee   Pain Orientation Left   Pain Descriptors /  Indicators Sore   Pain Type Surgical pain                         [facility] Adult PT Treatment/Exercise - 08/27/15 0001    Knee/Hip Exercises: Stretches   Passive Hamstring Stretch 2 reps;30 seconds;Both   Passive Hamstring Stretch Limitations seated EOB   Gastroc Stretch Limitations slant board 2x30s   Knee/Hip Exercises: Aerobic   Nustep 10 min L5  5 min prior to and 5 min following treatment   Knee/Hip Exercises: Standing   Heel Raises 20 reps;10 reps   Heel Raises Limitations alt heel-toe   Knee/Hip Exercises: Supine   Bridges Limitations x20   Knee Extension Limitations in/out from 90/90 2x10 ea   Cryotherapy   Number Minutes Cryotherapy 10 Minutes   Cryotherapy Location Knee   Type of Cryotherapy Ice pack                PT Education - 08/27/15 1019    Education provided Yes   Education Details exercise form/rationale, importance of graded increases of exercise to avoid severe soreness   Person(s) Educated Patient   Methods Explanation;Demonstration;Tactile cues;Verbal cues   Comprehension Verbalized understanding;Returned demonstration;Verbal cues required;Tactile  cues required;Need further instruction          PT Short Term Goals - 08/20/15 1024    PT SHORT TERM GOAL #1   Title Decreased average pain levels <=3/10 by 07/31/15   Baseline approx 4/10   Time 4   Period Weeks   Status On-going   PT SHORT TERM GOAL #2   Title extension to 0 deg, flexion to 120 deg by 07/31/15   Baseline 0-105 6/29   Time 4   Period Weeks   Status Partially Met   PT SHORT TERM GOAL #3   Title able to demonstrate appropriate gait pattern without rolling walker.    Time 4   Period Weeks   Status Achieved           PT Long Term Goals - 08/20/15 1028    PT LONG TERM GOAL #2   Title hip and knee MMT 4+/5 on L in all directions   Baseline all 4+/5 except 7 deg ext lag in long axis hip flx   Time 8   Period Weeks   Status Partially Met   PT LONG TERM GOAL  #3   Title able to indepenently navigate steps without need for UE support    Baseline able to demonstrate step over step with use of UE for support   Time 8   Period Weeks   Status On-going   PT LONG TERM GOAL #4   Title able to walk/stand for 1 hour without needing to sit down.    Status Achieved               Plan - 08/27/15 1042    Clinical Impression Statement focused on [facility] exercises today due to increased soreness but still challenging hip and LE musculature. Reports still feeling soreness following treatment but loosened up a little bit.    PT Next Visit Plan stair navigation, CKC, D/C 7/13   PT Home Exercise Plan seated hamstring stretch, ice 3-4 times/day 10-15 min ea with knee suspended. bridges, SL hip abduction, heel/toe raises, SLS, sit to stand   Consulted and Agree with Plan of Care Patient      Patient will benefit from skilled therapeutic intervention in order to improve the following deficits and impairments:     Visit Diagnosis: Pain in left knee  Muscle weakness (generalized)  Difficulty in walking, not elsewhere classified     Problem List Patient Active Problem List   Diagnosis Date Noted  . Diabetes mellitus with neuropathy ([place]) 06/30/2015  . Status post total left knee replacement 06/10/2015  . Diabetic neuropathy ([place]) 08/28/2013  . Testosterone deficiency 07/27/2010  . Dyslipidemia 07/27/2010  . HEADACHE 11/10/2009  . CORNS AND CALLUSES 07/17/2008  . Type 2 diabetes mellitus without complication ([place]) 09/38/1829  . ACTION TREMOR 12/07/2006  . Osteoarthritis 12/07/2006  . PARESTHESIA 12/07/2006    Yisrael Obryan C. Jovi Alvizo PT, DPT 08/27/2015 10:58 AM   [facility] [address] [place], [place], [address] Phone: [phone]   Fax:  [phone]  Name: Tyler Greer MRN: [id number] Date of Birth: Feb 28, 1935

## 2015-09-01 ENCOUNTER — Ambulatory Visit: Payer: Medicare Other | Admitting: Physical Therapy

## 2015-09-01 ENCOUNTER — Encounter: Payer: Self-pay | Admitting: Physical Therapy

## 2015-09-01 DIAGNOSIS — M6281 Muscle weakness (generalized): Secondary | ICD-10-CM | POA: Diagnosis not present

## 2015-09-01 DIAGNOSIS — M25562 Pain in left knee: Secondary | ICD-10-CM

## 2015-09-01 DIAGNOSIS — R262 Difficulty in walking, not elsewhere classified: Secondary | ICD-10-CM

## 2015-09-01 NOTE — Therapy (Signed)
[place], [place], [address] Phone: [phone]   Fax:  [phone]  Physical Therapy Treatment  Patient Details  Name: Tyler Greer MRN: [id number] Date of Birth: 01-11-1936 Referring Provider: Rodell Perna, MD  Encounter Date: 09/01/2015      PT End of Session - 09/01/15 0842    Visit Number 18   Number of Visits 19   Date for PT Re-Evaluation 09/03/15   Authorization Type MCR KX at visit 15   PT Start Time 0845   PT Stop Time 0941   PT Time Calculation (min) 56 min   Activity Tolerance Patient tolerated treatment well   Behavior During Therapy [facility] for tasks assessed/performed      Past Medical History  Diagnosis Date  . Intention tremor   . Paresthesia of foot     bilateral  . Stroke ([place]) 2005    mini stroke  . Diabetes mellitus type II 10/2007  . Migraine     "none in years" (06/10/2015)  . Osteoarthritis   . Arthritis     "hands and knees" (06/10/2015)  . Basal cell carcinoma, arm     "both"  . Basal cell carcinoma, face     Past Surgical History  Procedure Laterality Date  . Tonsillectomy    . Lumbar laminectomy  X 2  . Knee arthroscopy Bilateral 2002  . Hammer toe surgery Bilateral   . Joint replacement    . Back surgery    . Total knee arthroplasty Left 06/10/2015  . Knee arthroplasty Left 06/10/2015    Procedure: COMPUTER ASSISTED TOTAL KNEE ARTHROPLASTY;  Surgeon: Marybelle Killings, MD;  Location: [place];  Service: Orthopedics;  Laterality: Left;    There were no vitals filed for this visit.      Subjective Assessment - 09/01/15 0842    Subjective Pt was able to climb his 5 steps at home in his garage 10 times yesterday. Had some fatigue following later that day.    Currently in Pain? Yes   Pain Score 3    Pain Location Knee   Pain Orientation Left                         [facility] Adult PT Treatment/Exercise - 09/01/15 0001    Knee/Hip Exercises: Aerobic    Nustep 10 min L7   Knee/Hip Exercises: Standing   Forward Step Up Limitations airex alt feet   SLS on airex x5 attempts each leg   Other Standing Knee Exercises step over & return 1/2 foam   Knee/Hip Exercises: Supine   Bridges Limitations x20 green tband   Knee Extension Limitations in/out from 90/90 2x10 ea 2lb   Knee/Hip Exercises: Sidelying   Hip ABduction Limitations x30 ea with 2lb   Cryotherapy   Number Minutes Cryotherapy 10 Minutes   Cryotherapy Location Knee   Type of Cryotherapy Ice pack                PT Education - 09/01/15 0856    Education provided Yes   Education Details exercise form/rationale, HEP   Person(s) Educated Patient   Methods Explanation;Demonstration;Tactile cues;Verbal cues   Comprehension Verbalized understanding;Returned demonstration;Verbal cues required;Tactile cues required;Need further instruction          PT Short Term Goals - 08/20/15 1024    PT SHORT TERM GOAL #1   Title Decreased average pain levels <=3/10 by 07/31/15   Baseline approx 4/10  Time 4   Period Weeks   Status On-going   PT SHORT TERM GOAL #2   Title extension to 0 deg, flexion to 120 deg by 07/31/15   Baseline 0-105 6/29   Time 4   Period Weeks   Status Partially Met   PT SHORT TERM GOAL #3   Title able to demonstrate appropriate gait pattern without rolling walker.    Time 4   Period Weeks   Status Achieved           PT Long Term Goals - 08/20/15 1028    PT LONG TERM GOAL #2   Title hip and knee MMT 4+/5 on L in all directions   Baseline all 4+/5 except 7 deg ext lag in long axis hip flx   Time 8   Period Weeks   Status Partially Met   PT LONG TERM GOAL #3   Title able to indepenently navigate steps without need for UE support    Baseline able to demonstrate step over step with use of UE for support   Time 8   Period Weeks   Status On-going   PT LONG TERM GOAL #4   Title able to walk/stand for 1 hour without needing to sit down.    Status  Achieved               Plan - 09/01/15 0946    Clinical Impression Statement Pt did well today with challenges for stepping over and balance control on unsteady surface. Required some VC for weight shift in step overs to avoid jumping. Discussed importance of continued exercise following treatment and recommended joining a gym   PT Next Visit Plan stair navigation, CKC, D/C 7/12   Consulted and Agree with Plan of Care Patient      Patient will benefit from skilled therapeutic intervention in order to improve the following deficits and impairments:     Visit Diagnosis: Pain in left knee  Muscle weakness (generalized)  Difficulty in walking, not elsewhere classified     Problem List Patient Active Problem List   Diagnosis Date Noted  . Diabetes mellitus with neuropathy ([place]) 06/30/2015  . Status post total left knee replacement 06/10/2015  . Diabetic neuropathy ([place]) 08/28/2013  . Testosterone deficiency 07/27/2010  . Dyslipidemia 07/27/2010  . HEADACHE 11/10/2009  . CORNS AND CALLUSES 07/17/2008  . Type 2 diabetes mellitus without complication ([place]) 02/77/4128  . ACTION TREMOR 12/07/2006  . Osteoarthritis 12/07/2006  . PARESTHESIA 12/07/2006    Lakindra Wible C. Jabin Tapp PT, DPT 09/01/2015 9:50 AM   [facility] [address] [place], [place], [address] Phone: [phone]   Fax:  [phone]  Name: Tyler Greer MRN: [id number] Date of Birth: 10-Jul-1935

## 2015-09-01 NOTE — Patient Instructions (Signed)
Access Code: [email or web address]  URL: [email or web address]  Date: 09/01/2015  Prepared by: Selinda Eon   Exercises  Wall Quarter Squat - 3 reps - 1 sets - 30s hold - 1x daily - 7x weekly

## 2015-09-02 ENCOUNTER — Ambulatory Visit: Payer: Medicare Other | Admitting: Physical Therapy

## 2015-09-02 ENCOUNTER — Encounter: Payer: Self-pay | Admitting: Physical Therapy

## 2015-09-02 DIAGNOSIS — R262 Difficulty in walking, not elsewhere classified: Secondary | ICD-10-CM

## 2015-09-02 DIAGNOSIS — M25562 Pain in left knee: Secondary | ICD-10-CM | POA: Diagnosis not present

## 2015-09-02 DIAGNOSIS — M6281 Muscle weakness (generalized): Secondary | ICD-10-CM | POA: Diagnosis not present

## 2015-09-02 NOTE — Therapy (Signed)
[place], [place], [address] Phone: [phone]   Fax:  [phone]  Physical Therapy Treatment/Discharge summary  Patient Details  Name: Tyler Greer MRN: [id number] Date of Birth: 15-Oct-1935 Referring Provider: Rodell Perna, MD  Encounter Date: 09/02/2015      PT End of Session - 09/02/15 1507    Visit Number 19   Number of Visits 19   Date for PT Re-Evaluation 09/03/15   Authorization Type MCR KX at visit 15   PT Start Time 1500   PT Stop Time 1540   PT Time Calculation (min) 40 min   Activity Tolerance Patient tolerated treatment well   Behavior During Therapy [facility] for tasks assessed/performed      Past Medical History  Diagnosis Date  . Intention tremor   . Paresthesia of foot     bilateral  . Stroke ([place]) 2005    mini stroke  . Diabetes mellitus type II 10/2007  . Migraine     "none in years" (06/10/2015)  . Osteoarthritis   . Arthritis     "hands and knees" (06/10/2015)  . Basal cell carcinoma, arm     "both"  . Basal cell carcinoma, face     Past Surgical History  Procedure Laterality Date  . Tonsillectomy    . Lumbar laminectomy  X 2  . Knee arthroscopy Bilateral 2002  . Hammer toe surgery Bilateral   . Joint replacement    . Back surgery    . Total knee arthroplasty Left 06/10/2015  . Knee arthroplasty Left 06/10/2015    Procedure: COMPUTER ASSISTED TOTAL KNEE ARTHROPLASTY;  Surgeon: Marybelle Killings, MD;  Location: [facility];  Service: Orthopedics;  Laterality: Left;    There were no vitals filed for this visit.      Subjective Assessment - 09/02/15 1504    Subjective Continues to have difficulty walking on concrete but is fine on asphault.    How long can you stand comfortably? 1.5 hr   Patient Stated Goals able to sit with knee flexed comfortably in seated. 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 Yes   Pain Score 2    Pain Location Knee   Pain  Orientation Left   Pain Descriptors / Indicators Sore            [facility] PT Assessment - 09/02/15 0001    Observation/Other Assessments   Focus on Therapeutic Outcomes (FOTO)  37% disability   AROM   Left Knee Extension 0   Left Knee Flexion 112   Strength   Left Hip Flexion 4+/5   Left Hip ABduction 4+/5   Left Hip ADduction 4+/5   Left Knee Flexion 5/5   Left Knee Extension 5/5                     [facility] Adult PT Treatment/Exercise - 09/02/15 0001    Knee/Hip Exercises: Stretches   Passive Hamstring Stretch 2 reps;30 seconds;Both   Passive Hamstring Stretch Limitations seated EOB   Knee/Hip Exercises: Aerobic   Nustep 10 min L7   Knee/Hip Exercises: Standing   Lateral Step Up Limitations on to fitter board   SLS on airex 3x30s   Knee/Hip Exercises: Seated   Sit to Sand 20 reps   Knee/Hip Exercises: Supine   Bridges Limitations x20 with DF   Knee/Hip Exercises: Sidelying   Hip ABduction Limitations hip abd with knee flx/ext x15  PT Education - Sep 18, 2015 1506    Education provided Yes   Education Details exercise form/rationale, HEP   Person(s) Educated Patient   Methods Explanation;Demonstration;Verbal cues;Tactile cues   Comprehension Verbalized understanding;Returned demonstration;Verbal cues required;Tactile cues required          PT Short Term Goals - September 18, 2015 1517    PT SHORT TERM GOAL #1   Title Decreased average pain levels <=3/10 by 07/31/15   Status Achieved   PT SHORT TERM GOAL #2   Title 0-112   Status Partially Met   PT SHORT TERM GOAL #3   Title able to demonstrate appropriate gait pattern without rolling walker.    Status Achieved           PT Long Term Goals - Sep 18, 2015 1517    PT LONG TERM GOAL #1   Title FOTO to 60% ability by 08/28/15   Baseline 63% ability   Status Achieved   PT LONG TERM GOAL #2   Title hip and knee MMT 4+/5 on L in all directions   Status Achieved   PT LONG TERM GOAL #3   Title  able to indepenently navigate steps without need for UE support    Status Achieved   PT LONG TERM GOAL #4   Title able to walk/stand for 1 hour without needing to sit down.    Status Achieved               Plan - 09-18-15 1542    Clinical Impression Statement Pt will be d/c to independent home program at this time. Pt verbalized comfort and independence with HEP. Was instructed to contact uswith any further questions or needs.   Consulted and Agree with Plan of Care Patient      Patient will benefit from skilled therapeutic intervention in order to improve the following deficits and impairments:     Visit Diagnosis: Pain in left knee  Muscle weakness (generalized)  Difficulty in walking, not elsewhere classified       G-Codes - 09/18/2015 1559    Functional Limitation Mobility: Walking and moving around   Mobility: Walking and Moving Around Current Status [phone]) At least 20 percent but less than 40 percent impaired, limited or restricted   Mobility: Walking and Moving Around Goal Status [phone]) At least 20 percent but less than 40 percent impaired, limited or restricted   Mobility: Walking and Moving Around Discharge Status [phone]) At least 20 percent but less than 40 percent impaired, limited or restricted      Problem List Patient Active Problem List   Diagnosis Date Noted  . Diabetes mellitus with neuropathy ([place]) 06/30/2015  . Status post total left knee replacement 06/10/2015  . Diabetic neuropathy ([place]) 08/28/2013  . Testosterone deficiency 07/27/2010  . Dyslipidemia 07/27/2010  . HEADACHE 11/10/2009  . CORNS AND CALLUSES 07/17/2008  . Type 2 diabetes mellitus without complication ([place]) 99/37/1696  . ACTION TREMOR 12/07/2006  . Osteoarthritis 12/07/2006  . PARESTHESIA 12/07/2006   PHYSICAL THERAPY DISCHARGE SUMMARY  Visits from Start of Care: 19  Current functional level related to goals / functional outcomes: See above   Remaining deficits: See  above   Education / Equipment: Exercise form/rationale, HEP, POC, continued exercises, safety  Plan: Patient agrees to discharge.  Patient goals were met. Patient is being discharged due to meeting the stated rehab goals.  ?????       Guelda Batson C. Pier Laux PT, DPT 09-18-2015 4:00 PM   [facility] [address]  [place], [place], [address] Phone: [phone]   Fax:  [phone]  Name: Tyler Greer MRN: [id number] Date of Birth: 09-14-1935

## 2015-09-03 ENCOUNTER — Ambulatory Visit: Payer: Medicare Other | Admitting: Physical Therapy

## 2015-09-08 DIAGNOSIS — N401 Enlarged prostate with lower urinary tract symptoms: Secondary | ICD-10-CM | POA: Diagnosis not present

## 2015-09-08 DIAGNOSIS — R351 Nocturia: Secondary | ICD-10-CM | POA: Diagnosis not present

## 2015-10-10 ENCOUNTER — Encounter (INDEPENDENT_AMBULATORY_CARE_PROVIDER_SITE_OTHER): Payer: Self-pay

## 2015-11-30 ENCOUNTER — Ambulatory Visit (INDEPENDENT_AMBULATORY_CARE_PROVIDER_SITE_OTHER): Payer: Medicare Other | Admitting: Orthopaedic Surgery

## 2015-11-30 DIAGNOSIS — M25562 Pain in left knee: Secondary | ICD-10-CM

## 2015-12-01 ENCOUNTER — Ambulatory Visit (INDEPENDENT_AMBULATORY_CARE_PROVIDER_SITE_OTHER): Payer: Medicare Other | Admitting: Orthopaedic Surgery

## 2015-12-11 ENCOUNTER — Ambulatory Visit (INDEPENDENT_AMBULATORY_CARE_PROVIDER_SITE_OTHER): Payer: Medicare Other | Admitting: *Deleted

## 2015-12-11 DIAGNOSIS — Z23 Encounter for immunization: Secondary | ICD-10-CM

## 2015-12-30 ENCOUNTER — Other Ambulatory Visit: Payer: Self-pay | Admitting: Internal Medicine

## 2015-12-31 ENCOUNTER — Telehealth (INDEPENDENT_AMBULATORY_CARE_PROVIDER_SITE_OTHER): Payer: Self-pay | Admitting: Orthopaedic Surgery

## 2015-12-31 NOTE — Telephone Encounter (Signed)
Made patient appt for Tuesday 11/14.

## 2015-12-31 NOTE — Telephone Encounter (Signed)
Patient called advised his knee is swelling more and puffing out. Patient asked if there is anyway he can be worked  In sooner with Dr Lorin Mercy. Patient said his knee need to be drained.  The number to contact him is [phone]

## 2016-01-05 ENCOUNTER — Ambulatory Visit (INDEPENDENT_AMBULATORY_CARE_PROVIDER_SITE_OTHER): Payer: Medicare Other | Admitting: Orthopaedic Surgery

## 2016-01-05 ENCOUNTER — Encounter (INDEPENDENT_AMBULATORY_CARE_PROVIDER_SITE_OTHER): Payer: Self-pay | Admitting: Orthopaedic Surgery

## 2016-01-05 ENCOUNTER — Ambulatory Visit (INDEPENDENT_AMBULATORY_CARE_PROVIDER_SITE_OTHER): Payer: Medicare Other

## 2016-01-05 VITALS — BP 138/83 | HR 62 | Ht 71.0 in | Wt 200.0 lb

## 2016-01-05 DIAGNOSIS — M25562 Pain in left knee: Secondary | ICD-10-CM | POA: Diagnosis not present

## 2016-01-05 DIAGNOSIS — E0842 Diabetes mellitus due to underlying condition with diabetic polyneuropathy: Secondary | ICD-10-CM | POA: Diagnosis not present

## 2016-01-05 DIAGNOSIS — Z96652 Presence of left artificial knee joint: Secondary | ICD-10-CM

## 2016-01-05 MED ORDER — METHYLPREDNISOLONE ACETATE 40 MG/ML IJ SUSP
40.0000 mg | INTRAMUSCULAR | Status: AC | PRN
  Administered 2016-01-05: 40 mg via INTRA_ARTICULAR

## 2016-01-05 MED ORDER — LIDOCAINE HCL 1 % IJ SOLN
1.0000 mL | INTRAMUSCULAR | Status: AC | PRN
  Administered 2016-01-05: 1 mL

## 2016-01-05 MED ORDER — BUPIVACAINE HCL 0.25 % IJ SOLN
0.6600 mL | INTRAMUSCULAR | Status: AC | PRN
  Administered 2016-01-05: .66 mL via INTRA_ARTICULAR

## 2016-01-05 NOTE — Progress Notes (Signed)
Office Visit Note   Patient: Tyler Greer           Date of Birth: 05/30/1935           MRN: [id number] Visit Date: 01/05/2016              Requested by: Marletta Lor, MD [place], [place] [address] PCP: [name], MD   Assessment & Plan: Visit Diagnoses:  1. Left knee pain, unspecified chronicity   2. Total knee replacement status, left   3. Diabetic polyneuropathy associated with diabetes mellitus due to underlying condition ([place])    4.       Left knee synovitis.   5.        Right knee chondrocalcinosis Plan: After informed consent aspiration of his left knee was performed which showed clear yellow fluid slightly blood-tinged. It was not cloudy no purulence is present. We'll send the fluid for cell count crystal analysis and differential. Intra-articular injection performed in his knee which he tolerated well with some improvement in pain will seal this doesn't check him back again in 6 weeks.  Follow-Up Instructions: Return in about 6 weeks (around 02/16/2016).   Orders:  Orders Placed This Encounter  Procedures  . Large Joint Injection/Arthrocentesis  . Body fluid culture  . XR Knee 1-2 Views Left  . Cell count + diff,  w/ cryst-synvl fld   No orders of the defined types were placed in this encounter.     Procedures: Large Joint Inj Date/Time: 01/05/2016 2:44 PM Performed by: Marybelle Killings Authorized by: Marybelle Killings   Consent Given by:  Patient Site marked: the procedure site was marked   Indications:  Pain and joint swelling Location:  Knee Site:  L knee Needle Size:  22 G Needle Length:  1.5 inches Approach:  Anterolateral Ultrasound Guidance: No   Fluoroscopic Guidance: No   Arthrogram: No   Medications:  1 mL lidocaine 1 %; 40 mg methylPREDNISolone acetate 40 MG/ML; 0.66 mL bupivacaine 0.25 % Aspiration Attempted: Yes   Aspirate amount (mL):  25 Aspirate:  Yellow and blood-tinged Patient tolerance:   Patient tolerated the procedure well with no immediate complications     Clinical Data: No additional findings.   Subjective: Chief Complaint  Patient presents with  . Left Knee - Pain    Patient presents with increased swelling and pain in left knee. He is status post left total knee arthroplasty 06/10/15.  He is taking Aleve with no relief.  The pain is along the joint line medially and laterally. Patient requests aspiration.  Patient states since his total knee was doing well for several months in the last month he started to have increased swelling in his knee aching more more difficulty walking more difficulty bending his knee it seems puffy he is not noticing increased warmth he denies fever chills no other joint symptoms no history of gout. He does have diabetes with peripheral neuropathy decreased sensation stocking distribution but has not had any neuropathic joint problems.  Review of Systems  Constitutional: Negative for chills and diaphoresis.  HENT: Negative for ear discharge, ear pain and nosebleeds.   Eyes: Negative for discharge and visual disturbance.  Respiratory: Negative for cough, choking and shortness of breath.   Cardiovascular: Negative for chest pain and palpitations.  Gastrointestinal: Negative for abdominal distention and abdominal pain.  Endocrine: Negative for cold intolerance and heat intolerance.  Genitourinary: Negative for flank pain and hematuria.  Skin: Negative  for rash and wound.  Neurological: Negative for seizures and speech difficulty.  Hematological: Negative for adenopathy. Does not bruise/bleed easily.  Psychiatric/Behavioral: Negative for agitation and suicidal ideas.     Objective: Vital Signs: BP 138/83   Pulse 62   Ht 5\' 11"  (1.803 m)   Wt 200 lb (90.7 kg)   BMI 27.89 kg/m   Physical Exam  Constitutional: He is oriented to person, place, and time. He appears well-developed and well-nourished.  HENT:  Head: Normocephalic and  atraumatic.  Eyes: EOM are normal. Pupils are equal, round, and reactive to light.  Neck: No tracheal deviation present. No thyromegaly present.  Cardiovascular: Normal rate.   Pulmonary/Chest: Effort normal. He has no wheezes.  Abdominal: Soft. Bowel sounds are normal.  Neurological: He is alert and oriented to person, place, and time.  Skin: Skin is warm and dry. Capillary refill takes less than 2 seconds.  Psychiatric: He has a normal mood and affect. His behavior is normal. Judgment and thought content normal.    Ortho Exam patient is a mature with a left knee limp. There is a 2-3+ synovitis. Old lateral incision or old scar near the suprapatellar pouch region which he does not remember any specific history of laceration. He did not have an open lateral release. Knee comes out full extension and he flexes to 100. Collateral ligaments are stable he's tender along the medial and lateral joint line. No AP instability in flexion or extension.  Specialty Comments:  No specialty comments available.  Imaging: Xr Knee 1-2 Views Left  Result Date: 01/05/2016 Standing AP both knees and lateral of left knee are reviewed. This shows good position alignment of total knee arthroplasty without any evidence of loosening. No evidence of fracture. Opposite right knee shows calcification of the meniscus consistent with CPPD Impression: Postop left total knee arthroplasty. Calcification of the meniscus opposite right knee    PMFS History: Patient Active Problem List   Diagnosis Date Noted  . Diabetes mellitus with neuropathy ([place]) 06/30/2015  . Status post total left knee replacement 06/10/2015  . Diabetic neuropathy ([place]) 08/28/2013  . Testosterone deficiency 07/27/2010  . Dyslipidemia 07/27/2010  . HEADACHE 11/10/2009  . CORNS AND CALLUSES 07/17/2008  . Type 2 diabetes mellitus without complication ([place]) 0000000  . ACTION TREMOR 12/07/2006  . Osteoarthritis 12/07/2006  . PARESTHESIA  12/07/2006   Past Medical History:  Diagnosis Date  . Arthritis    "hands and knees" (06/10/2015)  . Basal cell carcinoma, arm    "both"  . Basal cell carcinoma, face   . Diabetes mellitus type II 10/2007  . Intention tremor   . Migraine    "none in years" (06/10/2015)  . Osteoarthritis   . Paresthesia of foot    bilateral  . Stroke ([place]) 2005   mini stroke    Family History  Problem Relation Age of Onset  . Heart disease Father     died age 74-MI  . Diabetes type II Brother   . Melanoma Brother     Past Surgical History:  Procedure Laterality Date  . BACK SURGERY    . HAMMER TOE SURGERY Bilateral   . JOINT REPLACEMENT    . KNEE ARTHROPLASTY Left 06/10/2015   Procedure: COMPUTER ASSISTED TOTAL KNEE ARTHROPLASTY;  Surgeon: Marybelle Killings, MD;  Location: [facility];  Service: Orthopedics;  Laterality: Left;  . KNEE ARTHROSCOPY Bilateral 2002  . LUMBAR LAMINECTOMY  X 2  . TONSILLECTOMY    . TOTAL KNEE ARTHROPLASTY  Left 06/10/2015   Social History   Occupational History  . Not on file.   Social History Main Topics  . Smoking status: Never Smoker  . Smokeless tobacco: Never Used     Comment: "might have smoked 1 pack of cigarettes in my life"  . Alcohol use No  . Drug use: No  . Sexual activity: Not Currently

## 2016-01-12 ENCOUNTER — Ambulatory Visit (INDEPENDENT_AMBULATORY_CARE_PROVIDER_SITE_OTHER): Payer: Medicare Other | Admitting: Internal Medicine

## 2016-01-12 ENCOUNTER — Encounter: Payer: Self-pay | Admitting: Internal Medicine

## 2016-01-12 VITALS — BP 110/70 | HR 67 | Temp 97.9°F | Ht 71.0 in | Wt 207.0 lb

## 2016-01-12 DIAGNOSIS — E785 Hyperlipidemia, unspecified: Secondary | ICD-10-CM

## 2016-01-12 DIAGNOSIS — M159 Polyosteoarthritis, unspecified: Secondary | ICD-10-CM

## 2016-01-12 DIAGNOSIS — E0840 Diabetes mellitus due to underlying condition with diabetic neuropathy, unspecified: Secondary | ICD-10-CM | POA: Diagnosis not present

## 2016-01-12 DIAGNOSIS — E0841 Diabetes mellitus due to underlying condition with diabetic mononeuropathy: Secondary | ICD-10-CM

## 2016-01-12 DIAGNOSIS — M150 Primary generalized (osteo)arthritis: Secondary | ICD-10-CM

## 2016-01-12 LAB — HEMOGLOBIN A1C: Hgb A1c MFr Bld: 5.9 % (ref 4.6–6.5)

## 2016-01-12 NOTE — Progress Notes (Signed)
Subjective:    Patient ID: Tyler Greer, male    DOB: [birth year], 80 y.o.   MRN: [id number]  HPI  Lab Results  Component Value Date   HGBA1C 5.6 05/07/2015   80 year old patient who is seen today for follow-up.  He has type 2 diabetes, CAD by peripheral neuropathy.  He has done quite well except for chronic left knee pain associated with effusion.  He is status post left total knee replacement surgery.  He has been followed closely by orthopedics. No other new concerns or complaints. He has had his annual flu shot.  Past Medical History:  Diagnosis Date  . Arthritis    "hands and knees" (06/10/2015)  . Basal cell carcinoma, arm    "both"  . Basal cell carcinoma, face   . Diabetes mellitus type II 10/2007  . Intention tremor   . Migraine    "none in years" (06/10/2015)  . Osteoarthritis   . Paresthesia of foot    bilateral  . Stroke ([place]) 2005   mini stroke     Social History   Social History  . Marital status: Widowed    Spouse name: N/A  . Number of children: N/A  . Years of education: N/A   Occupational History  . Not on file.   Social History Main Topics  . Smoking status: Never Smoker  . Smokeless tobacco: Never Used     Comment: "might have smoked 1 pack of cigarettes in my life"  . Alcohol use No  . Drug use: No  . Sexual activity: Not Currently   Other Topics Concern  . Not on file   Social History Narrative   Designated third party release signed on 07/13/09    Past Surgical History:  Procedure Laterality Date  . BACK SURGERY    . HAMMER TOE SURGERY Bilateral   . JOINT REPLACEMENT    . KNEE ARTHROPLASTY Left 06/10/2015   Procedure: COMPUTER ASSISTED TOTAL KNEE ARTHROPLASTY;  Surgeon: Marybelle Killings, MD;  Location: [facility];  Service: Orthopedics;  Laterality: Left;  . KNEE ARTHROSCOPY Bilateral 2002  . LUMBAR LAMINECTOMY  X 2  . TONSILLECTOMY    . TOTAL KNEE ARTHROPLASTY Left 06/10/2015    Family History  Problem Relation Age of Onset  .  Heart disease Father     died age 23-MI  . Diabetes type II Brother   . Melanoma Brother     No Known Allergies  Current Outpatient Prescriptions on File Prior to Visit  Medication Sig Dispense Refill  . metFORMIN (GLUCOPHAGE-XR) 500 MG 24 hr tablet TAKE TWO TABLETS EVERY DAY (Patient taking differently: ONE TABLET BY MOUTH TWICE A DAY WITH FOOD) 180 tablet 1   No current facility-administered medications on file prior to visit.     BP 110/70 (BP Location: Left Arm, Patient Position: Sitting, Cuff Size: Normal)   Pulse 67   Temp 97.9 F (36.6 C) (Oral)   Ht 5\' 11"  (1.803 m)   Wt 207 lb (93.9 kg)   SpO2 98%   BMI 28.87 kg/m    Review of Systems  Constitutional: Negative for appetite change, chills, fatigue and fever.  HENT: Negative for congestion, dental problem, ear pain, hearing loss, sore throat, tinnitus, trouble swallowing and voice change.   Eyes: Negative for pain, discharge and visual disturbance.  Respiratory: Negative for cough, chest tightness, wheezing and stridor.   Cardiovascular: Negative for chest pain, palpitations and leg swelling.  Gastrointestinal: Negative for abdominal distention, abdominal pain,  blood in stool, constipation, diarrhea, nausea and vomiting.  Genitourinary: Negative for difficulty urinating, discharge, flank pain, genital sores, hematuria and urgency.  Musculoskeletal: Positive for arthralgias, gait problem and joint swelling. Negative for back pain, myalgias and neck stiffness.  Skin: Negative for rash.  Neurological: Negative for dizziness, syncope, speech difficulty, weakness, numbness and headaches.  Hematological: Negative for adenopathy. Does not bruise/bleed easily.  Psychiatric/Behavioral: Negative for behavioral problems and dysphoric mood. The patient is not nervous/anxious.        Objective:   Physical Exam  Constitutional: He is oriented to person, place, and time. He appears well-developed.  Blood pressure low normal    HENT:  Head: Normocephalic.  Right Ear: External ear normal.  Left Ear: External ear normal.  Eyes: Conjunctivae and EOM are normal.  Neck: Normal range of motion.  Cardiovascular: Normal rate and normal heart sounds.   Pulmonary/Chest: Breath sounds normal.  Abdominal: Bowel sounds are normal.  Musculoskeletal: Normal range of motion. He exhibits no edema or tenderness.  Status post left total knee replacement surgery Minimal effusion left knee  Neurological: He is alert and oriented to person, place, and time.  Psychiatric: He has a normal mood and affect. His behavior is normal.          Assessment & Plan:   Diabetes mellitus type 2.  A home glucometer was dispensed.  Will check a hemoglobin A1c Osteoarthritis Status post left knee TKA  CPX 6 months  Tyler Greer Pilar Plate

## 2016-01-12 NOTE — Progress Notes (Signed)
Pre visit review using our clinic review tool, if applicable. No additional management support is needed unless otherwise documented below in the visit note. 

## 2016-01-12 NOTE — Patient Instructions (Signed)
Limit your sodium (Salt) intake   Please check your hemoglobin A1c every 3-6 months  Please check your blood pressure on a regular basis.  If it is consistently greater than 150/90, please make an office appointment.  Return in 6 months for follow-up

## 2016-01-13 ENCOUNTER — Telehealth (INDEPENDENT_AMBULATORY_CARE_PROVIDER_SITE_OTHER): Payer: Self-pay | Admitting: Orthopaedic Surgery

## 2016-01-13 NOTE — Telephone Encounter (Signed)
PATIENT CALLED THIS MORNING, HE HAS FLUID ON HIS KNEE AGAIN AND HE SAYS IT IS VERY PAINFUL. LAST TIME HE WAS IN THE OFFICE DR.YATES TALKED WITH HIM ABOUT SENDING OFF HIS LABS TO HAVE IT TESTED TO SEE WHY THE FLUID KEEPS COMING BACK. HE HASNT HEARD BACK FROM ANYONE AND JUST WOULD LIKE TO SPEAK WITH YOU REGARDING THIS.  [phone]

## 2016-01-13 NOTE — Telephone Encounter (Signed)
I left voicemail for patient advising that Dr. Lorin Mercy was going to review the labs at his next appt. I did advise patient that some time had opened up for Monday morning if he felt that he needed to get back in and see you sooner since the fluid was back and he is having increased pain. Please advise if you would like for me to do something else. Thanks.

## 2016-01-18 NOTE — Telephone Encounter (Signed)
Pt is scheduled on Friday Dec 1 at 400p

## 2016-01-18 NOTE — Telephone Encounter (Signed)
Please make appt for pt to come in this week thank

## 2016-01-22 ENCOUNTER — Encounter (INDEPENDENT_AMBULATORY_CARE_PROVIDER_SITE_OTHER): Payer: Self-pay | Admitting: Orthopaedic Surgery

## 2016-01-22 ENCOUNTER — Ambulatory Visit (INDEPENDENT_AMBULATORY_CARE_PROVIDER_SITE_OTHER): Payer: Medicare Other | Admitting: Orthopaedic Surgery

## 2016-01-22 VITALS — BP 111/67 | HR 83 | Ht 71.0 in | Wt 200.0 lb

## 2016-01-22 DIAGNOSIS — T8484XD Pain due to internal orthopedic prosthetic devices, implants and grafts, subsequent encounter: Secondary | ICD-10-CM | POA: Diagnosis not present

## 2016-01-22 DIAGNOSIS — Z96659 Presence of unspecified artificial knee joint: Secondary | ICD-10-CM

## 2016-01-22 NOTE — Progress Notes (Signed)
Office Visit Note   Patient: Tyler Greer           Date of Birth: 05/10/1935           MRN: [id number] Visit Date: 01/22/2016              Requested by: Marletta Lor, MD [place], [place] [address] PCP: [name], MD   Assessment & Plan: Visit Diagnoses:  1. Pain due to total knee replacement, subsequent encounter     Plan: Although aspiration was negative for crystals with this chondrocalcinosis of the opposite right knee he most likely is having some problems with pseudogout. He got temporary relief with intra-articular cortisone injection. We will defer another injection at this point. He will increase his Aleve to 2 by mouth twice a day and take it with food. He denies fever chills has had no cellulitis. Cell count and fluid analysis was negative and not suggestive of infection. We will recheck him in 4 weeks. X-rays have shown no evidence of loosening.  Follow-Up Instructions: Return in about 4 weeks (around 02/19/2016).   Orders:  No orders of the defined types were placed in this encounter.  No orders of the defined types were placed in this encounter.     Procedures: No procedures performed   Clinical Data: No additional findings.   Subjective: Chief Complaint  Patient presents with  . Left Knee - Pain    Patient returns with left knee pain and swelling. He is status post aspiration and injection on 01/05/2016. He states that there is pain all of the time. He is limping as soon as he gets up.  That gets a little better after he is up a bit.  He does take Aleve daily.  Patient has not been taking Aleve and full dosage. We discussed taking 2 by mouth twice a day with food. Previous standing x-ray shows calcification of the meniscus of his opposite knee. Knee aspiration found no crystals. Parents was orange slightly cloudy lymphocytes were 85 neutrophils were 13 monocytes were 2. No organisms were seen 3 days. Culture  was negative.  Review of Systems  Constitutional: Negative for chills and diaphoresis.  HENT: Negative for ear discharge, ear pain and nosebleeds.   Eyes: Negative for discharge and visual disturbance.  Respiratory: Negative for cough, choking and shortness of breath.   Cardiovascular: Negative for chest pain and palpitations.  Gastrointestinal: Negative for abdominal distention and abdominal pain.  Endocrine: Negative for cold intolerance and heat intolerance.  Genitourinary: Negative for flank pain and hematuria.  Musculoskeletal:       Positive for left knee pain post total knee arthroplasty.  Skin: Negative for rash and wound.  Neurological: Negative for seizures and speech difficulty.  Hematological: Negative for adenopathy. Does not bruise/bleed easily.  Psychiatric/Behavioral: Negative for agitation and suicidal ideas.     Objective: Vital Signs: BP 111/67   Pulse 83   Ht 5\' 11"  (1.803 m)   Wt 200 lb (90.7 kg)   BMI 27.89 kg/m   Physical Exam  Constitutional: He is oriented to person, place, and time. He appears well-developed and well-nourished.  HENT:  Head: Normocephalic and atraumatic.  Eyes: EOM are normal. Pupils are equal, round, and reactive to light.  Neck: No tracheal deviation present. No thyromegaly present.  Cardiovascular: Normal rate.   Pulmonary/Chest: Effort normal. He has no wheezes.  Abdominal: Soft. Bowel sounds are normal.  Musculoskeletal:  Well-healed midline left knee incision collateral  ligaments cruciate ligament exam shows no instability good collateral balance in flexion and extension. No pain with hip range of motion negative bowstring sign negative popped a compression anterior tib EHL is strong. Compartments are soft. Distal pulses are 2+. No venous stasis changes. Opposite right knee shows full extension flexion 110 some tenderness at both menisci no effusion right knee.  Neurological: He is alert and oriented to person, place, and time.    Skin: Skin is warm and dry. Capillary refill takes less than 2 seconds.  Psychiatric: He has a normal mood and affect. His behavior is normal. Judgment and thought content normal.    Ortho Exam left knee shows a trace effusion. No erythema. No cellulitis. Trace tenderness throughout the knee slightly more prominent along the medial aspect. No tenderness over the pes bursa or semi-tendinosis or chrysalis. Iliotibial band is normal. He has good quad strength. Pelvis is level no lumbar tenderness no sciatic notch tenderness trochanteric palpation is normal.  Specialty Comments:  No specialty comments available.  Imaging: No results found.   PMFS History: Patient Active Problem List   Diagnosis Date Noted  . Diabetes mellitus with neuropathy ([place]) 06/30/2015  . Status post total left knee replacement 06/10/2015  . Diabetic neuropathy ([place]) 08/28/2013  . Testosterone deficiency 07/27/2010  . Dyslipidemia 07/27/2010  . HEADACHE 11/10/2009  . CORNS AND CALLUSES 07/17/2008  . Type 2 diabetes mellitus without complication ([place]) 0000000  . ACTION TREMOR 12/07/2006  . Osteoarthritis 12/07/2006  . PARESTHESIA 12/07/2006   Past Medical History:  Diagnosis Date  . Arthritis    "hands and knees" (06/10/2015)  . Basal cell carcinoma, arm    "both"  . Basal cell carcinoma, face   . Diabetes mellitus type II 10/2007  . Intention tremor   . Migraine    "none in years" (06/10/2015)  . Osteoarthritis   . Paresthesia of foot    bilateral  . Stroke ([place]) 2005   mini stroke    Family History  Problem Relation Age of Onset  . Heart disease Father     died age 63-MI  . Diabetes type II Brother   . Melanoma Brother     Past Surgical History:  Procedure Laterality Date  . BACK SURGERY    . HAMMER TOE SURGERY Bilateral   . JOINT REPLACEMENT    . KNEE ARTHROPLASTY Left 06/10/2015   Procedure: COMPUTER ASSISTED TOTAL KNEE ARTHROPLASTY;  Surgeon: Marybelle Killings, MD;  Location: [facility];   Service: Orthopedics;  Laterality: Left;  . KNEE ARTHROSCOPY Bilateral 2002  . LUMBAR LAMINECTOMY  X 2  . TONSILLECTOMY    . TOTAL KNEE ARTHROPLASTY Left 06/10/2015   Social History   Occupational History  . Not on file.   Social History Main Topics  . Smoking status: Never Smoker  . Smokeless tobacco: Never Used     Comment: "might have smoked 1 pack of cigarettes in my life"  . Alcohol use No  . Drug use: No  . Sexual activity: Not Currently

## 2016-01-26 ENCOUNTER — Ambulatory Visit (INDEPENDENT_AMBULATORY_CARE_PROVIDER_SITE_OTHER): Payer: Medicare Other | Admitting: Orthopaedic Surgery

## 2016-02-17 ENCOUNTER — Ambulatory Visit (INDEPENDENT_AMBULATORY_CARE_PROVIDER_SITE_OTHER): Payer: Medicare Other | Admitting: Orthopaedic Surgery

## 2016-02-17 ENCOUNTER — Encounter (INDEPENDENT_AMBULATORY_CARE_PROVIDER_SITE_OTHER): Payer: Self-pay | Admitting: Orthopaedic Surgery

## 2016-02-17 VITALS — BP 127/74 | HR 55 | Temp 97.0°F | Ht 71.0 in | Wt 200.0 lb

## 2016-02-17 DIAGNOSIS — Z96652 Presence of left artificial knee joint: Secondary | ICD-10-CM

## 2016-02-17 DIAGNOSIS — M11262 Other chondrocalcinosis, left knee: Secondary | ICD-10-CM | POA: Diagnosis not present

## 2016-02-17 NOTE — Progress Notes (Signed)
Office Visit Note   Patient: Tyler Greer           Date of Birth: [birth year]           MRN: [id number] Visit Date: 02/17/2016              Requested by: Tyler Lor, MD [place], [place] [address] PCP: Tyler Cowden, MD   Assessment & Plan: Visit Diagnoses:  1. Pseudogout of knee, left   2. History of total knee arthroplasty, left     Plan: Patient can continue exercising as the recurrence of significant effusion he can return for repeat aspiration. He has only mild swelling today states not really been exercising since the the facilities were closed for the holidays. He walks a mile he does knee extension does some back stretching exercises and leg machines.  Follow-Up Instructions: Return in about 3 months (around 05/17/2016).   Orders:  No orders of the defined types were placed in this encounter.  No orders of the defined types were placed in this encounter.     Procedures: No procedures performed   Clinical Data: No additional findings.   Subjective: Chief Complaint  Patient presents with  . Left Knee - Follow-up    LT TKA 06/10/15 ~8 months post op    Patient presents today for four week follow up for left knee. He is status post left total knee arthroplasty on 06/10/15. He is approximately 8 months post op. He states he is still in pain medial and lateral aspect of knee and increased pain with exercise. He was taking two Aleve bid, but switched back to one Aleve bid due to increased bleeding with minor injuries. He complains of swelling, he is status post aspiration and injection 01/05/16.   Patient's had a single cortisone injection in his knee was better for several days and he had gradual return of fluid. Patient's aspirate was negative for crystals however his opposite knee shows chondrocalcinosis with calcification of the meniscus. Patient's been using some Aleve. When he excised a lot has some increased swelling in  his knees intermittently. Cultures were negative for aspirate. X-rays show no evidence of loosening.  Review of Systems 14 point review of systems updated unchanged from 01/22/2016 office visit   Objective: Vital Signs: BP 127/74 (BP Location: Right Arm, Patient Position: Sitting)   Pulse (!) 55   Temp 97 F (36.1 C) (Oral)   Ht 5\' 11"  (1.803 m)   Wt 200 lb (90.7 kg)   BMI 27.89 kg/m   Physical Exam  Constitutional: He is oriented to person, place, and time. He appears well-developed and well-nourished.  HENT:  Head: Normocephalic and atraumatic.  Eyes: EOM are normal. Pupils are equal, round, and reactive to light.  Neck: No tracheal deviation present. No thyromegaly present.  Cardiovascular: Normal rate.   Pulmonary/Chest: Effort normal. He has no wheezes.  Abdominal: Soft. Bowel sounds are normal.  Musculoskeletal:  Trace left knee effusion collateral ligaments are stable will extension good flexion past 110. No pain with hip range of motion. Anterior incision is well-healed.  Neurological: He is alert and oriented to person, place, and time.  Skin: Skin is warm and dry. Capillary refill takes less than 2 seconds.  Psychiatric: He has a normal mood and affect. His behavior is normal. Judgment and thought content normal.    Ortho Exam  Specialty Comments:  No specialty comments available.  Imaging: No results found.   PMFS History: Patient  Active Problem List   Diagnosis Date Noted  . Diabetes mellitus with neuropathy ([place]) 06/30/2015  . Status post total left knee replacement 06/10/2015  . Diabetic neuropathy ([place]) 08/28/2013  . Testosterone deficiency 07/27/2010  . Dyslipidemia 07/27/2010  . HEADACHE 11/10/2009  . CORNS AND CALLUSES 07/17/2008  . Type 2 diabetes mellitus without complication ([place]) 0000000  . ACTION TREMOR 12/07/2006  . Osteoarthritis 12/07/2006  . PARESTHESIA 12/07/2006   Past Medical History:  Diagnosis Date  . Arthritis    "hands  and knees" (06/10/2015)  . Basal cell carcinoma, arm    "both"  . Basal cell carcinoma, face   . Diabetes mellitus type II 10/2007  . Intention tremor   . Migraine    "none in years" (06/10/2015)  . Osteoarthritis   . Paresthesia of foot    bilateral  . Stroke ([place]) 2005   mini stroke    Family History  Problem Relation Age of Onset  . Heart disease Father     died age 34-MI  . Diabetes type II Brother   . Melanoma Brother     Past Surgical History:  Procedure Laterality Date  . BACK SURGERY    . HAMMER TOE SURGERY Bilateral   . JOINT REPLACEMENT    . KNEE ARTHROPLASTY Left 06/10/2015   Procedure: COMPUTER ASSISTED TOTAL KNEE ARTHROPLASTY;  Surgeon: Tyler Killings, MD;  Location: [facility];  Service: Orthopedics;  Laterality: Left;  . KNEE ARTHROSCOPY Bilateral 2002  . LUMBAR LAMINECTOMY  X 2  . TONSILLECTOMY    . TOTAL KNEE ARTHROPLASTY Left 06/10/2015   Social History   Occupational History  . Not on file.   Social History Main Topics  . Smoking status: Never Smoker  . Smokeless tobacco: Never Used     Comment: "might have smoked 1 pack of cigarettes in my life"  . Alcohol use No  . Drug use: No  . Sexual activity: Not Currently

## 2016-04-06 ENCOUNTER — Encounter: Payer: Self-pay | Admitting: Family Medicine

## 2016-04-06 ENCOUNTER — Ambulatory Visit (INDEPENDENT_AMBULATORY_CARE_PROVIDER_SITE_OTHER): Payer: Medicare Other | Admitting: Family Medicine

## 2016-04-06 VITALS — BP 118/70 | HR 74 | Temp 98.2°F | Ht 71.0 in | Wt 213.0 lb

## 2016-04-06 DIAGNOSIS — S91332A Puncture wound without foreign body, left foot, initial encounter: Secondary | ICD-10-CM | POA: Diagnosis not present

## 2016-04-06 NOTE — Patient Instructions (Signed)
Follow up immediately for any fever, chills, redness, warmth, or drainage.

## 2016-04-06 NOTE — Progress Notes (Signed)
Subjective:     Patient ID: Tyler Greer, male   DOB: 07/20/1935, 81 y.o.   MRN: [id number]  HPI Patient type II diabetic seen as acute work in. Yesterday he was walking around and apparently stepped on a nail that was somewhat rusty. This went through his shoe. He has peripheral neuropathy and did not feel pain initially. He pulled out the nail and noticed a small superficial puncture proximal to the second metatarsophalangeal joint. He's not any fevers or chills. No erythema. No warmth. No drainage. Tetanus 2012. Injury occurred around 5 PM yesterday  Past Medical History:  Diagnosis Date  . Arthritis    "hands and knees" (06/10/2015)  . Basal cell carcinoma, arm    "both"  . Basal cell carcinoma, face   . Diabetes mellitus type II 10/2007  . Intention tremor   . Migraine    "none in years" (06/10/2015)  . Osteoarthritis   . Paresthesia of foot    bilateral  . Stroke ([place]) 2005   mini stroke   Past Surgical History:  Procedure Laterality Date  . BACK SURGERY    . HAMMER TOE SURGERY Bilateral   . JOINT REPLACEMENT    . KNEE ARTHROPLASTY Left 06/10/2015   Procedure: COMPUTER ASSISTED TOTAL KNEE ARTHROPLASTY;  Surgeon: Marybelle Killings, MD;  Location: [facility];  Service: Orthopedics;  Laterality: Left;  . KNEE ARTHROSCOPY Bilateral 2002  . LUMBAR LAMINECTOMY  X 2  . TONSILLECTOMY    . TOTAL KNEE ARTHROPLASTY Left 06/10/2015    reports that he has never smoked. He has never used smokeless tobacco. He reports that he does not drink alcohol or use drugs. family history includes Diabetes type II in his brother; Heart disease in his father; Melanoma in his brother. No Known Allergies   Review of Systems  Constitutional: Negative for chills and fever.       Objective:   Physical Exam  Constitutional: He appears well-developed and well-nourished.  Skin:  Left foot reveals very superficial appearing puncture about 3 cm proximal to the second metatarsophalangeal joint. There is no  erythema. No visible swelling. No warmth. No drainage. Nontender.       Assessment:     Superficial puncture wound into left foot in a type II diabetic with peripheral neuropathy. No signs of secondary infection at this time and this was approximately 24 hours after injury    Plan:     -Follow-up immediately for any swelling, fever, chills, warmth, drainage  Eulas Post MD [facility]

## 2016-04-29 ENCOUNTER — Ambulatory Visit (INDEPENDENT_AMBULATORY_CARE_PROVIDER_SITE_OTHER): Payer: Medicare Other | Admitting: Family Medicine

## 2016-04-29 ENCOUNTER — Encounter: Payer: Self-pay | Admitting: Family Medicine

## 2016-04-29 VITALS — BP 120/78 | HR 70 | Temp 98.3°F | Wt 212.2 lb

## 2016-04-29 DIAGNOSIS — M79672 Pain in left foot: Secondary | ICD-10-CM

## 2016-04-29 NOTE — Progress Notes (Signed)
Subjective:     Patient ID: Tyler Greer, male   DOB: Apr 10, 1935, 81 y.o.   MRN: [id number]  HPI   Patient seen with some persistent swelling and mild pain left ball of foot. Refer to previous note. About a month ago stepped on a one and 1/2 inch galvanized nail through his shoe with possible superficial puncture ball of foot. He had no signs of secondary infection. Tetanus up-to-date.  Patient has noticed some localized callused area but no erythema. No drainage.  Past Medical History:  Diagnosis Date  . Arthritis    "hands and knees" (06/10/2015)  . Basal cell carcinoma, arm    "both"  . Basal cell carcinoma, face   . Diabetes mellitus type II 10/2007  . Intention tremor   . Migraine    "none in years" (06/10/2015)  . Osteoarthritis   . Paresthesia of foot    bilateral  . Stroke ([place]) 2005   mini stroke   Past Surgical History:  Procedure Laterality Date  . BACK SURGERY    . HAMMER TOE SURGERY Bilateral   . JOINT REPLACEMENT    . KNEE ARTHROPLASTY Left 06/10/2015   Procedure: COMPUTER ASSISTED TOTAL KNEE ARTHROPLASTY;  Surgeon: Marybelle Killings, MD;  Location: [facility];  Service: Orthopedics;  Laterality: Left;  . KNEE ARTHROSCOPY Bilateral 2002  . LUMBAR LAMINECTOMY  X 2  . TONSILLECTOMY    . TOTAL KNEE ARTHROPLASTY Left 06/10/2015    reports that he has never smoked. He has never used smokeless tobacco. He reports that he does not drink alcohol or use drugs. family history includes Diabetes type II in his brother; Heart disease in his father; Melanoma in his brother. No Known Allergies   Review of Systems  Constitutional: Negative for activity change, appetite change, chills, fatigue, fever and unexpected weight change.  HENT: Negative for congestion, ear pain, sore throat and trouble swallowing.   Respiratory: Positive for cough. Negative for shortness of breath, wheezing and stridor.   Cardiovascular: Negative for chest pain and leg swelling.  Gastrointestinal: Negative  for abdominal pain.  Musculoskeletal: Negative for arthralgias.  Skin: Negative for rash.  Neurological: Negative for syncope and headaches.  Hematological: Negative for adenopathy.       Objective:   Physical Exam  Constitutional: He appears well-developed and well-nourished.  Cardiovascular: Normal rate and regular rhythm.   Skin:  Patient has small callused area ball of foot in the midline. Nontender. No warmth. No erythema.       Assessment:     Recent puncture wound from nail left foot. He states the nail was removed in entirety. No signs of secondary infection. He does have some slight callus formation    Plan:     -Used #15 blade and gently pared down callused area. No foreign body seen. -X-ray left foot to rule out any metallic foreign body -We did not explore foot any deeper as he has history of neuropathy and diabetes and high risk of infection -consider podiatry referral- if any foreign body noted or pain persists.    Eulas Post MD [facility]

## 2016-04-29 NOTE — Patient Instructions (Signed)
Go for left foot x-ray Monday, as discussed, at [facility] clinic.

## 2016-04-29 NOTE — Progress Notes (Signed)
Pre visit review using our clinic review tool, if applicable. No additional management support is needed unless otherwise documented below in the visit note. 

## 2016-05-05 ENCOUNTER — Ambulatory Visit (INDEPENDENT_AMBULATORY_CARE_PROVIDER_SITE_OTHER)
Admission: RE | Admit: 2016-05-05 | Discharge: 2016-05-05 | Disposition: A | Discharge status: Home / Self Care | Payer: Medicare Other | Source: Ambulatory Visit | Attending: Family Medicine | Admitting: Family Medicine

## 2016-05-05 DIAGNOSIS — M79672 Pain in left foot: Secondary | ICD-10-CM

## 2016-05-05 DIAGNOSIS — M7989 Other specified soft tissue disorders: Secondary | ICD-10-CM | POA: Diagnosis not present

## 2016-05-05 DIAGNOSIS — S99922A Unspecified injury of left foot, initial encounter: Secondary | ICD-10-CM | POA: Diagnosis not present

## 2016-05-17 ENCOUNTER — Ambulatory Visit (INDEPENDENT_AMBULATORY_CARE_PROVIDER_SITE_OTHER): Payer: Medicare Other | Admitting: Orthopaedic Surgery

## 2016-05-17 ENCOUNTER — Encounter (INDEPENDENT_AMBULATORY_CARE_PROVIDER_SITE_OTHER): Payer: Self-pay | Admitting: Orthopaedic Surgery

## 2016-05-17 VITALS — BP 141/68 | HR 64 | Ht 71.0 in | Wt 208.0 lb

## 2016-05-17 DIAGNOSIS — Z96652 Presence of left artificial knee joint: Secondary | ICD-10-CM

## 2016-05-17 NOTE — Progress Notes (Signed)
Office Visit Note   Patient: Tyler Greer           Date of Birth: 23-Mar-1935           MRN: [id number] Visit Date: 05/17/2016              Requested by: Marletta Lor, MD [place], [place] [address] PCP: [name], MD   Assessment & Plan: Visit Diagnoses:  1. Status post total left knee replacement     Plan: Patient continue on lower extremity strengthening exercises. He has some problems of the opposite knee with calcification of the meniscus consistent with CPPD. He's going to local church where he does strengthening including bike leg extension excises and leg presses. Continues to have problems with the numbness in his feet consistent with his diabetes recently stepped on something but the x-rays revealed no retained foreign body. He's not had an MRI scan. No fever chills no drainage.  Follow-Up Instructions: Return if symptoms worsen or fail to improve.  We reviewed his previous labs, knee aspiration, rheumatologic workup and x-rays that showed good position and alignment of total knee arthroplasty on the left. Orders:  No orders of the defined types were placed in this encounter.  No orders of the defined types were placed in this encounter.     Procedures: No procedures performed   Clinical Data: No additional findings.   Subjective: Chief Complaint  Patient presents with  . Left Knee - Pain, Follow-up    Patient returns for three month follow up. He is status pot left total knee arthroplasty on 06/10/2015. He states that his knee hurts all of the time and continues to swell. He requests an aspiration today to remove the fluid that is on it because it bothers him. He does not want any cortisone as he states this has never been helpful to him. He takes aleve prn for arthritis, but states this does not help his knee.   Has   Review of Systems  Constitutional: Negative for chills and diaphoresis.  HENT: Negative for ear  discharge, ear pain and nosebleeds.   Eyes: Negative for discharge and visual disturbance.  Respiratory: Negative for cough, choking and shortness of breath.   Cardiovascular: Negative for chest pain and palpitations.  Gastrointestinal: Negative for abdominal distention and abdominal pain.  Endocrine: Negative for cold intolerance and heat intolerance.       Positive for diabetes.  Genitourinary: Negative for flank pain and hematuria.  Musculoskeletal:       Continue problems with small arthritis in his hands and wrists. He notes some swelling in his knees at the end of the day. Right knee creaks and pops with activities.  Skin: Negative for rash and wound.  Neurological: Negative for seizures and speech difficulty.  Hematological: Negative for adenopathy. Does not bruise/bleed easily.  Psychiatric/Behavioral: Negative for agitation and suicidal ideas.     Objective: Vital Signs: BP (!) 141/68   Pulse 64   Ht 5\' 11"  (1.803 m)   Wt 208 lb (94.3 kg)   BMI 29.01 kg/m   Physical Exam  Constitutional: He is oriented to person, place, and time. He appears well-developed and well-nourished.  HENT:  Head: Normocephalic and atraumatic.  Eyes: EOM are normal. Pupils are equal, round, and reactive to light.  Neck: No tracheal deviation present. No thyromegaly present.  Cardiovascular: Normal rate.   Pulmonary/Chest: Effort normal. He has no wheezes.  Abdominal: Soft. Bowel sounds are normal.  Musculoskeletal:  Left total knee arthroplasty incision is well-healed. There is no effusion today. Knee reached full extension is good flexion. Palpable pulses. Decreased sensation the feet and ankles. Negative straight leg raising no pain with hip range of motion. Trochanteric bursa is nontender. Patellar tracking is good.  Neurological: He is alert and oriented to person, place, and time.  Skin: Skin is warm and dry. Capillary refill takes less than 2 seconds.  Psychiatric: He has a normal mood and  affect. His behavior is normal. Judgment and thought content normal.    Ortho Exam  Specialty Comments:  No specialty comments available.  Imaging: No results found.   PMFS History: Patient Active Problem List   Diagnosis Date Noted  . Diabetes mellitus with neuropathy ([place]) 06/30/2015  . Status post total left knee replacement 06/10/2015  . Diabetic neuropathy ([place]) 08/28/2013  . Testosterone deficiency 07/27/2010  . Dyslipidemia 07/27/2010  . HEADACHE 11/10/2009  . CORNS AND CALLUSES 07/17/2008  . Type 2 diabetes mellitus without complication ([place]) 38/17/7116  . ACTION TREMOR 12/07/2006  . Osteoarthritis 12/07/2006  . PARESTHESIA 12/07/2006   Past Medical History:  Diagnosis Date  . Arthritis    "hands and knees" (06/10/2015)  . Basal cell carcinoma, arm    "both"  . Basal cell carcinoma, face   . Diabetes mellitus type II 10/2007  . Intention tremor   . Migraine    "none in years" (06/10/2015)  . Osteoarthritis   . Paresthesia of foot    bilateral  . Stroke ([place]) 2005   mini stroke    Family History  Problem Relation Age of Onset  . Heart disease Father     died age 56-MI  . Diabetes type II Brother   . Melanoma Brother     Past Surgical History:  Procedure Laterality Date  . BACK SURGERY    . HAMMER TOE SURGERY Bilateral   . JOINT REPLACEMENT    . KNEE ARTHROPLASTY Left 06/10/2015   Procedure: COMPUTER ASSISTED TOTAL KNEE ARTHROPLASTY;  Surgeon: Marybelle Killings, MD;  Location: [facility];  Service: Orthopedics;  Laterality: Left;  . KNEE ARTHROSCOPY Bilateral 2002  . LUMBAR LAMINECTOMY  X 2  . TONSILLECTOMY    . TOTAL KNEE ARTHROPLASTY Left 06/10/2015   Social History   Occupational History  . Not on file.   Social History Main Topics  . Smoking status: Never Smoker  . Smokeless tobacco: Never Used     Comment: "might have smoked 1 pack of cigarettes in my life"  . Alcohol use No  . Drug use: No  . Sexual activity: Not Currently

## 2016-05-23 ENCOUNTER — Ambulatory Visit (INDEPENDENT_AMBULATORY_CARE_PROVIDER_SITE_OTHER): Payer: Medicare Other | Admitting: Internal Medicine

## 2016-05-23 ENCOUNTER — Encounter: Payer: Self-pay | Admitting: Internal Medicine

## 2016-05-23 VITALS — BP 128/72 | HR 79 | Temp 98.9°F | Ht 71.0 in | Wt 213.0 lb

## 2016-05-23 DIAGNOSIS — E0840 Diabetes mellitus due to underlying condition with diabetic neuropathy, unspecified: Secondary | ICD-10-CM | POA: Diagnosis not present

## 2016-05-23 DIAGNOSIS — E785 Hyperlipidemia, unspecified: Secondary | ICD-10-CM

## 2016-05-23 DIAGNOSIS — L309 Dermatitis, unspecified: Secondary | ICD-10-CM | POA: Diagnosis not present

## 2016-05-23 DIAGNOSIS — E119 Type 2 diabetes mellitus without complications: Secondary | ICD-10-CM | POA: Diagnosis not present

## 2016-05-23 MED ORDER — TRIAMCINOLONE 0.1 % CREAM:EUCERIN CREAM 1:1: 1.0000 "application " | TOPICAL_CREAM | Freq: Two times a day (BID) | CUTANEOUS | 3 refills | Status: DC

## 2016-05-23 NOTE — Patient Instructions (Addendum)

## 2016-05-23 NOTE — Progress Notes (Signed)
Subjective:    Patient ID: Tyler Greer, male    DOB: Feb 19, 1936, 81 y.o.   MRN: [id number]  HPI  81 year old patient who has a history of diabetes as well as essential tremor.  His chief complaint today is a rash involving the right outer lower leg that has been present for approximately 3 months.  He also describes some pruritus involving his right abdominal area.  Past Medical History:  Diagnosis Date  . Arthritis    "hands and knees" (06/10/2015)  . Basal cell carcinoma, arm    "both"  . Basal cell carcinoma, face   . Diabetes mellitus type II 10/2007  . Intention tremor   . Migraine    "none in years" (06/10/2015)  . Osteoarthritis   . Paresthesia of foot    bilateral  . Stroke ([place]) 2005   mini stroke     Social History   Social History  . Marital status: Widowed    Spouse name: N/A  . Number of children: N/A  . Years of education: N/A   Occupational History  . Not on file.   Social History Main Topics  . Smoking status: Never Smoker  . Smokeless tobacco: Never Used     Comment: "might have smoked 1 pack of cigarettes in my life"  . Alcohol use No  . Drug use: No  . Sexual activity: Not Currently   Other Topics Concern  . Not on file   Social History Narrative   Designated third party release signed on 07/13/09    Past Surgical History:  Procedure Laterality Date  . BACK SURGERY    . HAMMER TOE SURGERY Bilateral   . JOINT REPLACEMENT    . KNEE ARTHROPLASTY Left 06/10/2015   Procedure: COMPUTER ASSISTED TOTAL KNEE ARTHROPLASTY;  Surgeon: Marybelle Killings, MD;  Location: [facility];  Service: Orthopedics;  Laterality: Left;  . KNEE ARTHROSCOPY Bilateral 2002  . LUMBAR LAMINECTOMY  X 2  . TONSILLECTOMY    . TOTAL KNEE ARTHROPLASTY Left 06/10/2015    Family History  Problem Relation Age of Onset  . Heart disease Father     died age 60-MI  . Diabetes type II Brother   . Melanoma Brother     No Known Allergies  Current Outpatient Prescriptions on  File Prior to Visit  Medication Sig Dispense Refill  . metFORMIN (GLUCOPHAGE-XR) 500 MG 24 hr tablet TAKE TWO TABLETS EVERY DAY (Patient taking differently: ONE TABLET BY MOUTH TWICE A DAY WITH FOOD) 180 tablet 1   No current facility-administered medications on file prior to visit.     BP 128/72 (BP Location: Right Arm, Patient Position: Sitting, Cuff Size: Normal)   Pulse 79   Temp 98.9 F (37.2 C) (Oral)   Ht 5\' 11"  (1.803 m)   Wt 213 lb (96.6 kg)   SpO2 98%   BMI 29.71 kg/m     .Review of Systems  Constitutional: Negative for appetite change, chills, fatigue and fever.  HENT: Negative for congestion, dental problem, ear pain, hearing loss, sore throat, tinnitus, trouble swallowing and voice change.   Eyes: Negative for pain, discharge and visual disturbance.  Respiratory: Negative for cough, chest tightness, wheezing and stridor.   Cardiovascular: Negative for chest pain, palpitations and leg swelling.  Gastrointestinal: Negative for abdominal distention, abdominal pain, blood in stool, constipation, diarrhea, nausea and vomiting.  Genitourinary: Negative for difficulty urinating, discharge, flank pain, genital sores, hematuria and urgency.  Musculoskeletal: Negative for arthralgias, back pain, gait  problem, joint swelling, myalgias and neck stiffness.  Skin: Positive for rash.  Neurological: Negative for dizziness, syncope, speech difficulty, weakness, numbness and headaches.  Hematological: Negative for adenopathy. Does not bruise/bleed easily.  Psychiatric/Behavioral: Negative for behavioral problems and dysphoric mood. The patient is not nervous/anxious.        Objective:   Physical Exam  Constitutional: He is oriented to person, place, and time. He appears well-developed and well-nourished. No distress.  HENT:  Head: Normocephalic.  Right Ear: External ear normal.  Left Ear: External ear normal.  Eyes: Conjunctivae and EOM are normal.  Neck: Normal range of motion.   Cardiovascular: Normal rate and normal heart sounds.   Pulmonary/Chest: Breath sounds normal.  Abdominal: Bowel sounds are normal.  Musculoskeletal: Normal range of motion. He exhibits no edema or tenderness.  Neurological: He is alert and oriented to person, place, and time.  Skin:  Dry scaly maculopapular rash involving the lateral aspect of the right lower leg No rash involving the right abdominal region  Psychiatric: He has a normal mood and affect. His behavior is normal.          Assessment & Plan:   atopic dermatitis. Patient information dispensed.  Will treat with combination of Eucerin/triamcinolone Diabetes mellitus.  This has been well-controlled.  Follow-up as scheduled  [name]

## 2016-05-23 NOTE — Progress Notes (Signed)
Pre visit review using our clinic review tool, if applicable. No additional management support is needed unless otherwise documented below in the visit note. 

## 2016-06-20 ENCOUNTER — Ambulatory Visit (INDEPENDENT_AMBULATORY_CARE_PROVIDER_SITE_OTHER): Payer: Medicare Other | Admitting: Internal Medicine

## 2016-06-20 ENCOUNTER — Encounter: Payer: Self-pay | Admitting: Internal Medicine

## 2016-06-20 VITALS — BP 138/62 | HR 64 | Temp 97.4°F | Wt 209.8 lb

## 2016-06-20 DIAGNOSIS — M542 Cervicalgia: Secondary | ICD-10-CM

## 2016-06-20 DIAGNOSIS — M159 Polyosteoarthritis, unspecified: Secondary | ICD-10-CM

## 2016-06-20 DIAGNOSIS — E0840 Diabetes mellitus due to underlying condition with diabetic neuropathy, unspecified: Secondary | ICD-10-CM

## 2016-06-20 DIAGNOSIS — E119 Type 2 diabetes mellitus without complications: Secondary | ICD-10-CM

## 2016-06-20 DIAGNOSIS — M150 Primary generalized (osteo)arthritis: Secondary | ICD-10-CM | POA: Diagnosis not present

## 2016-06-20 LAB — HEMOGLOBIN A1C: HEMOGLOBIN A1C: 6.2 % (ref 4.6–6.5)

## 2016-06-20 MED ORDER — MELOXICAM 7.5 MG PO TABS: 7.5000 mg | ORAL_TABLET | Freq: Every day | ORAL | 2 refills | Status: DC

## 2016-06-20 NOTE — Progress Notes (Signed)
Pre visit review using our clinic review tool, if applicable. No additional management support is needed unless otherwise documented below in the visit note. 

## 2016-06-20 NOTE — Patient Instructions (Addendum)
WE NOW OFFER   [facility] FAST TRACK!!!  SAME DAY Appointments for ACUTE CARE  Such as: Sprains, Injuries, cuts, abrasions, rashes, muscle pain, joint pain, back pain Colds, flu, sore throats, headache, allergies, cough, fever  Ear pain, sinus and eye infections Abdominal pain, nausea, vomiting, diarrhea, upset stomach Animal/insect bites  3 Easy Ways to Schedule: Walk-In Scheduling Call in scheduling Mychart Sign-up: [email or web address]         Cervical Collar A cervical collar is a device that supports your chin and the back of your head. It is used after a severe neck injury to protect your head and neck. It does this by restricting the movement of the top part of your spine, which is located in your neck. A cervical collar may be used when you have:  A fractured neck.  Ligament damage.  A spinal cord injury. What instructions should I follow?  Wear the collar for as long as your health care provider instructs.  Follow your health care provider's instructions about how to put on and take off your collar.  Do not make your collar so tight that you feel pain or it is hard for you to breathe.  Do not remove the collar unless your health care provider says it is okay. Ask your health care provider if you can remove the collar for showering or eating or to apply ice.  Do not drive a car until your health care provider says it is okay.  Keep all follow-up visits as directed by your health care provider. This is important. Any delay in getting necessary care can keep your injury from healing properly.  Apply ice to the injured area:  Put ice in a plastic bag.  Place a towel between your skin and the bag.  Leave the ice on for 20 minutes, 2-3 times per day for the first 2 days. This information is not intended to replace advice given to you by your health care provider. Make sure you discuss any questions you have with your health care  provider. Document Released: 10/31/2003 Document Revised: 06/18/2015 Document Reviewed: 09/16/2013 Elsevier Interactive Patient Education  2017 [employer]  Cervical Sprain A cervical sprain is a stretch or tear in one or more of the tough, cord-like tissues that connect bones (ligaments) in the neck. Cervical sprains can range from mild to severe. Severe cervical sprains can cause the spinal bones (vertebrae) in the neck to be unstable. This can lead to spinal cord damage and can result in serious nervous system problems. The amount of time that it takes for a cervical sprain to get better depends on the cause and extent of the injury. Most cervical sprains heal in 4-6 weeks. What are the causes? Cervical sprains may be caused by an injury (trauma), such as from a motor vehicle accident, a fall, or sudden forward and backward whipping movement of the head and neck (whiplash injury). Mild cervical sprains may be caused by wear and tear over time, such as from poor posture, sitting in a chair that does not provide support, or looking up or down for long periods of time. What increases the risk? The following factors may make you more likely to develop this condition:  Participating in activities that have a high risk of trauma to the neck. These include contact sports, auto racing, gymnastics, and diving.  Taking risks when driving or riding in a motor vehicle, such as speeding.  Having osteoarthritis of the spine.  Having poor strength and  flexibility of the neck.  A previous neck injury.  Having poor posture.  Spending a lot of time in certain positions that put stress on the neck, such as sitting at a computer for long periods of time. What are the signs or symptoms? Symptoms of this condition include:  Pain, soreness, stiffness, tenderness, swelling, or a burning sensation in the front, back, or sides of the neck.  Sudden tightening of neck muscles that you cannot control (muscle  spasms).  Pain in the shoulders or upper back.  Limited ability to move the neck.  Headache.  Dizziness.  Nausea.  Vomiting.  Weakness, numbness, or tingling in a hand or an arm. Symptoms may develop right away after injury, or they may develop over a few days. In some cases, symptoms may go away with treatment and return (recur) over time. How is this diagnosed? This condition may be diagnosed based on:  Your medical history.  Your symptoms.  Any recent injuries or known neck problems that you have, such as arthritis in the neck.  A physical exam.  Imaging tests, such as:  X-rays.  MRI.  CT scan. How is this treated? This condition is treated by resting and icing the injured area and doing physical therapy exercises. Depending on the severity of your condition, treatment may also include:  Keeping your neck in place (immobilized) for periods of time. This may be done using:  A cervical collar. This supports your chin and the back of your head.  A cervical traction device. This is a sling that holds up your head. This removes weight and pressure from your neck, and it may help to relieve pain.  Medicines that help to relieve pain and inflammation.  Medicines that help to relax your muscles (muscle relaxants).  Surgery. This is rare. Follow these instructions at home: If you have a cervical collar:   Wear it as told by your health care provider. Do not remove the collar unless instructed by your health care provider.  Ask your health care provider before you make any adjustments to your collar.  If you have long hair, keep it outside of the collar.  Ask your health care provider if you can remove the collar for cleaning and bathing. If you are allowed to remove the collar for cleaning or bathing:  Follow instructions from your health care provider about how to remove the collar safely.  Clean the collar by wiping it with mild soap and water and drying it  completely.  If your collar has removable pads, remove them every 1-2 days and wash them by hand with soap and water. Let them air-dry completely before you put them back in the collar.  Check your skin under the collar for irritation or sores. If you see any, tell your health care provider. Managing pain, stiffness, and swelling   If directed, use a cervical traction device as told by your health care provider.  If directed, apply heat to the affected area before you do your physical therapy or as often as told by your health care provider. Use the heat source that your health care provider recommends, such as a moist heat pack or a heating pad.  Place a towel between your skin and the heat source.  Leave the heat on for 20-30 minutes.  Remove the heat if your skin turns bright red. This is especially important if you are unable to feel pain, heat, or cold. You may have a greater risk of getting  burned.  If directed, put ice on the affected area:  Put ice in a plastic bag.  Place a towel between your skin and the bag.  Leave the ice on for 20 minutes, 2-3 times a day. Activity   Do not drive while wearing a cervical collar. If you do not have a cervical collar, ask your health care provider if it is safe to drive while your neck heals.  Do not drive or use heavy machinery while taking prescription pain medicine or muscle relaxants, unless your health care provider approves.  Do not lift anything that is heavier than 10 lb (4.5 kg) until your health care provider tells you that it is safe.  Rest as directed by your health care provider. Avoid positions and activities that make your symptoms worse. Ask your health care provider what activities are safe for you.  If physical therapy was prescribed, do exercises as told by your health care provider or physical therapist. General instructions   Take over-the-counter and prescription medicines only as told by your health care  provider.  Do not use any products that contain nicotine or tobacco, such as cigarettes and e-cigarettes. These can delay healing. If you need help quitting, ask your health care provider.  Keep all follow-up visits as told by your health care provider or physical therapist. This is important. How is this prevented? To prevent a cervical sprain from happening again:  Use and maintain good posture. Make any needed adjustments to your workstation to help you use good posture.  Exercise regularly as directed by your health care provider or physical therapist.  Avoid risky activities that may cause a cervical sprain. Contact a health care provider if:  You have symptoms that get worse or do not get better after 2 weeks of treatment.  You have pain that gets worse or does not get better with medicine.  You develop new, unexplained symptoms.  You have sores or irritated skin on your neck from wearing your cervical collar. Get help right away if:  You have severe pain.  You develop numbness, tingling, or weakness in any part of your body.  You cannot move a part of your body (you have paralysis).  You have neck pain along with:  Severe dizziness.  Headache. Summary  A cervical sprain is a stretch or tear in one or more of the tough, cord-like tissues that connect bones (ligaments) in the neck.  Cervical sprains may be caused by an injury (trauma), such as from a motor vehicle accident, a fall, or sudden forward and backward whipping movement of the head and neck (whiplash injury).  Symptoms may develop right away after injury, or they may develop over a few days.  This condition is treated by resting and icing the injured area and doing physical therapy exercises. This information is not intended to replace advice given to you by your health care provider. Make sure you discuss any questions you have with your health care provider. Document Released: 12/05/2006 Document Revised:  10/07/2015 Document Reviewed: 10/07/2015 Elsevier Interactive Patient Education  2017 [employer].

## 2016-06-20 NOTE — Progress Notes (Signed)
Subjective:    Patient ID: Tyler Greer, male    DOB: 27-Jan-1936, 81 y.o.   MRN: [id number]  HPI  81 year old patient with a history of type 2 diabetes.  This is accompanied by a mild diabetic peripheral neuropathy. He presents today with a chief complaint of posterior neck pain associated with intermittent occipital headaches for the past 3 weeks.  He states that he has been working much harder and has been much more active  He states that he has spent a considerable time on his phone with his head and neck in a awkward position  Past Medical History:  Diagnosis Date  . Arthritis    "hands and knees" (06/10/2015)  . Basal cell carcinoma, arm    "both"  . Basal cell carcinoma, face   . Diabetes mellitus type II 10/2007  . Intention tremor   . Migraine    "none in years" (06/10/2015)  . Osteoarthritis   . Paresthesia of foot    bilateral  . Stroke ([place]) 2005   mini stroke     Social History   Social History  . Marital status: Widowed    Spouse name: N/A  . Number of children: N/A  . Years of education: N/A   Occupational History  . Not on file.   Social History Main Topics  . Smoking status: Never Smoker  . Smokeless tobacco: Never Used     Comment: "might have smoked 1 pack of cigarettes in my life"  . Alcohol use No  . Drug use: No  . Sexual activity: Not Currently   Other Topics Concern  . Not on file   Social History Narrative   Designated third party release signed on 07/13/09    Past Surgical History:  Procedure Laterality Date  . BACK SURGERY    . HAMMER TOE SURGERY Bilateral   . JOINT REPLACEMENT    . KNEE ARTHROPLASTY Left 06/10/2015   Procedure: COMPUTER ASSISTED TOTAL KNEE ARTHROPLASTY;  Surgeon: Marybelle Killings, MD;  Location: [facility];  Service: Orthopedics;  Laterality: Left;  . KNEE ARTHROSCOPY Bilateral 2002  . LUMBAR LAMINECTOMY  X 2  . TONSILLECTOMY    . TOTAL KNEE ARTHROPLASTY Left 06/10/2015    Family History  Problem Relation Age of  Onset  . Heart disease Father     died age 3-MI  . Diabetes type II Brother   . Melanoma Brother     No Known Allergies  Current Outpatient Prescriptions on File Prior to Visit  Medication Sig Dispense Refill  . metFORMIN (GLUCOPHAGE-XR) 500 MG 24 hr tablet TAKE TWO TABLETS EVERY DAY (Patient taking differently: ONE TABLET BY MOUTH TWICE A DAY WITH FOOD) 180 tablet 1  . Triamcinolone Acetonide (TRIAMCINOLONE 0.1 % CREAM : EUCERIN) CREA Apply 1 application topically 2 (two) times daily. 1 each 3   No current facility-administered medications on file prior to visit.     BP 138/62 (BP Location: Left Arm, Patient Position: Sitting, Cuff Size: Normal)   Pulse 64   Temp 97.4 F (36.3 C) (Oral)   Wt 209 lb 12.8 oz (95.2 kg)   SpO2 99%   BMI 29.26 kg/m     Review of Systems  Constitutional: Negative for appetite change, chills, fatigue and fever.  HENT: Negative for congestion, dental problem, ear pain, hearing loss, sore throat, tinnitus, trouble swallowing and voice change.   Eyes: Negative for pain, discharge and visual disturbance.  Respiratory: Negative for cough, chest tightness, wheezing and stridor.  Cardiovascular: Negative for chest pain, palpitations and leg swelling.  Gastrointestinal: Negative for abdominal distention, abdominal pain, blood in stool, constipation, diarrhea, nausea and vomiting.  Genitourinary: Negative for difficulty urinating, discharge, flank pain, genital sores, hematuria and urgency.  Musculoskeletal: Positive for neck pain and neck stiffness. Negative for arthralgias, back pain, gait problem, joint swelling and myalgias.  Skin: Negative for rash.  Neurological: Positive for headaches. Negative for dizziness, syncope, speech difficulty, weakness and numbness.  Hematological: Negative for adenopathy. Does not bruise/bleed easily.  Psychiatric/Behavioral: Negative for behavioral problems and dysphoric mood. The patient is not nervous/anxious.         Objective:   Physical Exam  Constitutional: He appears well-developed and well-nourished. No distress.  Blood pressure well controlled  Neck:  Slight decreased range of motion in all spheres with discomfort No radicular symptoms          Assessment & Plan:   Neck pain.  Suspect underlying osteoarthritis with some overuse.  Will treat with short-term anti-inflammatory medications We'll try a cervical collar.  If unimproved in 48 hours  [place]

## 2016-06-28 ENCOUNTER — Telehealth: Payer: Self-pay | Admitting: Internal Medicine

## 2016-06-28 NOTE — Telephone Encounter (Signed)
Diabetes remains controlled with a1c of 6.2 and goal at least under 7. Has gone up slightly from last visit of 5.9 but still acceptable range

## 2016-06-28 NOTE — Telephone Encounter (Signed)
Pt would like blood work results °

## 2016-06-28 NOTE — Telephone Encounter (Signed)
In Dr Marthann Schiller absence could you please see message below, please advise

## 2016-06-29 NOTE — Telephone Encounter (Signed)
Notified patient of results; understanding voiced.

## 2016-07-12 ENCOUNTER — Encounter: Payer: Medicare Other | Admitting: Internal Medicine

## 2016-07-14 ENCOUNTER — Encounter: Payer: Medicare Other | Admitting: Internal Medicine

## 2016-07-22 ENCOUNTER — Ambulatory Visit (INDEPENDENT_AMBULATORY_CARE_PROVIDER_SITE_OTHER): Payer: Medicare Other | Admitting: Internal Medicine

## 2016-07-22 ENCOUNTER — Encounter: Payer: Self-pay | Admitting: Internal Medicine

## 2016-07-22 DIAGNOSIS — E785 Hyperlipidemia, unspecified: Secondary | ICD-10-CM

## 2016-07-22 DIAGNOSIS — E119 Type 2 diabetes mellitus without complications: Secondary | ICD-10-CM

## 2016-07-22 DIAGNOSIS — M150 Primary generalized (osteo)arthritis: Secondary | ICD-10-CM

## 2016-07-22 DIAGNOSIS — M1120 Other chondrocalcinosis, unspecified site: Secondary | ICD-10-CM | POA: Insufficient documentation

## 2016-07-22 DIAGNOSIS — E0840 Diabetes mellitus due to underlying condition with diabetic neuropathy, unspecified: Secondary | ICD-10-CM | POA: Diagnosis not present

## 2016-07-22 DIAGNOSIS — M159 Polyosteoarthritis, unspecified: Secondary | ICD-10-CM

## 2016-07-22 LAB — COMPREHENSIVE METABOLIC PANEL
ALK PHOS: 74 U/L (ref 39–117)
ALT: 9 U/L (ref 0–53)
AST: 14 U/L (ref 0–37)
Albumin: 3.9 g/dL (ref 3.5–5.2)
BILIRUBIN TOTAL: 0.4 mg/dL (ref 0.2–1.2)
BUN: 22 mg/dL (ref 6–23)
CALCIUM: 9.5 mg/dL (ref 8.4–10.5)
CO2: 26 meq/L (ref 19–32)
Chloride: 106 mEq/L (ref 96–112)
Creatinine, Ser: 1.07 mg/dL (ref 0.40–1.50)
GFR: 70.94 mL/min (ref >60.00)
GLUCOSE: 90 mg/dL (ref 70–99)
POTASSIUM: 4.2 meq/L (ref 3.5–5.1)
Sodium: 140 mEq/L (ref 135–145)
TOTAL PROTEIN: 6.9 g/dL (ref 6.0–8.3)

## 2016-07-22 LAB — CBC WITH DIFFERENTIAL/PLATELET
BASOS ABS: 0 10*3/uL (ref 0.0–0.1)
Basophils Relative: 0.5 % (ref 0.0–3.0)
EOS PCT: 1.6 % (ref 0.0–5.0)
Eosinophils Absolute: 0.1 10*3/uL (ref 0.0–0.7)
HCT: 41.9 % (ref 39.0–52.0)
HEMOGLOBIN: 13.8 g/dL (ref 13.0–17.0)
LYMPHS ABS: 1.1 10*3/uL (ref 0.7–4.0)
Lymphocytes Relative: 20.5 % (ref 12.0–46.0)
MCHC: 33 g/dL (ref 30.0–36.0)
MCV: 89.7 fl (ref 78.0–100.0)
MONO ABS: 0.6 10*3/uL (ref 0.1–1.0)
MONOS PCT: 11.5 % (ref 3.0–12.0)
NEUTROS PCT: 65.9 % (ref 43.0–77.0)
Neutro Abs: 3.5 10*3/uL (ref 1.4–7.7)
Platelets: 241 10*3/uL (ref 150.0–400.0)
RBC: 4.67 Mil/uL (ref 4.22–5.81)
RDW: 14.8 % (ref 11.5–15.5)
WBC: 5.3 10*3/uL (ref 4.0–10.5)

## 2016-07-22 LAB — LIPID PANEL
CHOL/HDL RATIO: 5
CHOLESTEROL: 193 mg/dL (ref 0–200)
HDL: 41 mg/dL (ref >39.00)
NONHDL: 151.61
TRIGLYCERIDES: 270 mg/dL — AB (ref 0.0–149.0)
VLDL: 54 mg/dL — ABNORMAL HIGH (ref 0.0–40.0)

## 2016-07-22 LAB — TSH: TSH: 3.49 u[IU]/mL (ref 0.35–4.50)

## 2016-07-22 LAB — LDL CHOLESTEROL, DIRECT: LDL DIRECT: 107 mg/dL

## 2016-07-22 LAB — MICROALBUMIN / CREATININE URINE RATIO
Creatinine,U: 203.5 mg/dL
MICROALB/CREAT RATIO: 0.3 mg/g (ref 0.0–30.0)

## 2016-07-22 MED ORDER — METFORMIN HCL ER 500 MG PO TB24: 500.0000 mg | ORAL_TABLET | Freq: Every day | ORAL | 1 refills | Status: DC

## 2016-07-22 NOTE — Patient Instructions (Addendum)
WE NOW OFFER   [facility] FAST TRACK!!!  SAME DAY Appointments for ACUTE CARE  Such as: Sprains, Injuries, cuts, abrasions, rashes, muscle pain, joint pain, back pain Colds, flu, sore throats, headache, allergies, cough, fever  Ear pain, sinus and eye infections Abdominal pain, nausea, vomiting, diarrhea, upset stomach Animal/insect bites  3 Easy Ways to Schedule: Walk-In Scheduling Call in scheduling Mychart Sign-up: [email or web address]  Decrease metformin to 1 tablet every morning with breakfast  Return in 6 months for follow-up

## 2016-07-22 NOTE — Progress Notes (Signed)
Subjective:    Patient ID: Tyler Greer, male    DOB: September 26, 1935, 81 y.o.   MRN: [id number]  HPI   81 year old patient who is seen today for annual clinical exam and Medicare wellness visit He has type 2 diabetes which has been well-controlled on metformin.  He has mild burning foot pain as a complication of diabetic peripheral neuropathy.  He has osteoarthritis and is approximately 13 months status post left total knee replacement surgery.  He is doing quite well today He is scheduled to see dermatology later this month for a facial skin cancer. No recent eye examination   Lab Results  Component Value Date   HGBA1C 6.2 06/20/2016    BP Readings from Last 3 Encounters:  07/22/16 124/80  06/20/16 138/62  05/23/16 128/72    His last colonoscopy was in 2000.  He does not wish to have any further colonoscopies.    Past Medical History:  Diagnosis Date  . Arthritis    "hands and knees" (06/10/2015)  . Basal cell carcinoma, arm    "both"  . Basal cell carcinoma, face   . Diabetes mellitus type II 10/2007  . Intention tremor   . Migraine    "none in years" (06/10/2015)  . Osteoarthritis   . Paresthesia of foot    bilateral  . Stroke ([place]) 2005   mini stroke     Social History   Social History  . Marital status: Widowed    Spouse name: N/A  . Number of children: N/A  . Years of education: N/A   Occupational History  . Not on file.   Social History Main Topics  . Smoking status: Never Smoker  . Smokeless tobacco: Never Used     Comment: "might have smoked 1 pack of cigarettes in my life"  . Alcohol use No  . Drug use: No  . Sexual activity: Not Currently   Other Topics Concern  . Not on file   Social History Narrative   Designated third party release signed on 07/13/09    Past Surgical History:  Procedure Laterality Date  . BACK SURGERY    . HAMMER TOE SURGERY Bilateral   . JOINT REPLACEMENT    . KNEE ARTHROPLASTY Left 06/10/2015   Procedure:  COMPUTER ASSISTED TOTAL KNEE ARTHROPLASTY;  Surgeon: Marybelle Killings, MD;  Location: [facility];  Service: Orthopedics;  Laterality: Left;  . KNEE ARTHROSCOPY Bilateral 2002  . LUMBAR LAMINECTOMY  X 2  . TONSILLECTOMY    . TOTAL KNEE ARTHROPLASTY Left 06/10/2015    Family History  Problem Relation Age of Onset  . Heart disease Father        died age 27-[place]  . Diabetes type II Brother   . Melanoma Brother     No Known Allergies  Current Outpatient Prescriptions on File Prior to Visit  Medication Sig Dispense Refill  . metFORMIN (GLUCOPHAGE-XR) 500 MG 24 hr tablet TAKE TWO TABLETS EVERY DAY (Patient taking differently: ONE TABLET BY MOUTH TWICE A DAY WITH FOOD) 180 tablet 1  . Triamcinolone Acetonide (TRIAMCINOLONE 0.1 % CREAM : EUCERIN) CREA Apply 1 application topically 2 (two) times daily. 1 each 3  . meloxicam (MOBIC) 7.5 MG tablet Take 1 tablet (7.5 mg total) by mouth daily. (Patient not taking: Reported on 07/22/2016) 30 tablet 2   No current facility-administered medications on file prior to visit.     BP 124/80 (BP Location: Left Arm, Patient Position: Sitting, Cuff Size: Normal)   Pulse  64   Temp 97.8 F (36.6 C) (Oral)   Ht 5\' 11"  (1.803 m)   Wt 205 lb 6.4 oz (93.2 kg)   SpO2 98%   BMI 28.65 kg/m     Medicare wellness visit  1. Risk factors, based on past  M,S,F history-  cardiovascular risk factors include a history of type 2 diabetes  2.  Physical activities: Remains quite active still works part-time walks daily in the mornings; participates at a health club 3-4 times per week  3.  Depression/mood: No history depression or mood disorder 4.  Hearing:  No deficits  5.  ADL's: Independent in all aspects of daily living  6.  Fall risk: Low  7.  Home safety: No problems identified  8.  Height weight, and visual acuity; height and weight stable;  is scheduled 4.  Follow-up eye examination later this summer  9.  Counseling: Followup eye exam. He will continue  active lifestyle and heart healthy diet 10. Lab orders based on risk factors: Laboratory panel including hemoglobin A1c will be reviewed.  Will review a lipid profile and urine for microalbumin  11. Referral: Follow-up dermatology and ophthalmology  12. Care plan: Continue a regular exercise program proper diet  13. Cognitive assessment: Alert and oriented normal affect. No cognitive dysfunction.  14.  Preventive services will include annual examinations, as well as clinical physical examinations with screening lab.  Patient has declined any further screening colonoscopies. Patient was provided with a written and personalized care plan  15.  Provider list includes primary care ophthalmology and dermatology      Preventive Screening-Counseling & Management  Alcohol-Tobacco  Smoking Status: never  Caffeine-Diet-Exercise  Does Patient Exercise: yes   Allergies:  No Known Drug Allergies   Past History:  Past Medical History:   Osteoarthritis  Diabetes mellitus, type II onset September 2009  intention tremor  paresthesias of the feet / diabetic peripheral neuropathy  Past Surgical History:  Tonsillectomy  Lumbar laminectomyx 2  bilateral arthroscopic knee surgery 2002  colonoscopy approximately 2000  foot surgery for hammertoe deformities Status post left total knee replacement   Family History:   mother died at 65- history of impaired glucose tolerance  father died age 76, [place]  two brothers two sisters  one brother deceased of a melanoma; history of diabetes, type II   Social History:   Widower Never Smoked  Regular exercise-yes  Smoking Status: never  Does Patient Exercise: yes   Review of Systems  Constitutional: Negative for appetite change, chills, fatigue and fever.  HENT: Negative for congestion, dental problem, ear pain, hearing loss, sore throat, tinnitus, trouble swallowing and voice change.   Eyes: Negative for pain, discharge and  visual disturbance.  Respiratory: Negative for cough, chest tightness, wheezing and stridor.   Cardiovascular: Negative for chest pain, palpitations and leg swelling.  Gastrointestinal: Negative for abdominal distention, abdominal pain, blood in stool, constipation, diarrhea, nausea and vomiting.  Genitourinary: Negative for difficulty urinating, discharge, flank pain, genital sores, hematuria and urgency.  Musculoskeletal: Negative for arthralgias, back pain, gait problem, joint swelling, myalgias and neck stiffness.  Skin: Negative for rash.  Neurological: Positive for tremors and numbness. Negative for dizziness, syncope, speech difficulty, weakness and headaches.  Hematological: Negative for adenopathy. Does not bruise/bleed easily.  Psychiatric/Behavioral: Negative for behavioral problems and dysphoric mood. The patient is not nervous/anxious.        Objective:   Physical Exam  Constitutional: He appears well-developed and well-nourished.  HENT:  Head:  Normocephalic and atraumatic.  Right Ear: External ear normal.  Left Ear: External ear normal.  Nose: Nose normal.  Mouth/Throat: Oropharynx is clear and moist.  Eyes: Conjunctivae and EOM are normal. Pupils are equal, round, and reactive to light. No scleral icterus.  Neck: Normal range of motion. Neck supple. No JVD present. No thyromegaly present.  Cardiovascular: Regular rhythm, normal heart sounds and intact distal pulses.  Exam reveals no gallop and no friction rub.   No murmur heard. Pedal pulses are full  Pulmonary/Chest: Effort normal and breath sounds normal. He exhibits no tenderness.  Abdominal: Soft. Bowel sounds are normal. He exhibits no distension and no mass. There is no tenderness.  Genitourinary: Penis normal. Rectal exam shows guaiac negative stool.  Musculoskeletal: Normal range of motion. He exhibits no edema or tenderness.  Status post left total knee replacement surgery  Lymphadenopathy:    He has no  cervical adenopathy.  Neurological: He is alert. He has normal reflexes. No cranial nerve deficit. Coordination normal.  Skin: Skin is warm and dry. No rash noted.  Psychiatric: He has a normal mood and affect. His behavior is normal.          Assessment & Plan:   Diabetes mellitus type 2.  Well-controlled.We'll review a lipid profile and urine for microalbumin Subsequent Medicare wellness visit  Patient will follow-up with dermatology as scheduled later this month.  Annual eye examination recommended. We'll decrease metformin to 1 tablet daily due to some diarrhea.  Check hemoglobin A1c next visit  [name]

## 2016-08-16 DIAGNOSIS — L300 Nummular dermatitis: Secondary | ICD-10-CM | POA: Diagnosis not present

## 2016-08-16 DIAGNOSIS — L570 Actinic keratosis: Secondary | ICD-10-CM | POA: Diagnosis not present

## 2016-08-16 DIAGNOSIS — L821 Other seborrheic keratosis: Secondary | ICD-10-CM | POA: Diagnosis not present

## 2016-08-16 DIAGNOSIS — Z85828 Personal history of other malignant neoplasm of skin: Secondary | ICD-10-CM | POA: Diagnosis not present

## 2016-09-09 ENCOUNTER — Emergency Department (HOSPITAL_COMMUNITY): Payer: Medicare Other

## 2016-09-09 ENCOUNTER — Encounter (HOSPITAL_COMMUNITY): Payer: Self-pay | Admitting: *Deleted

## 2016-09-09 ENCOUNTER — Emergency Department (HOSPITAL_COMMUNITY)
Admission: EM | Admit: 2016-09-09 | Discharge: 2016-09-09 | Disposition: A | Discharge status: Home / Self Care | Payer: Medicare Other | Attending: Emergency Medicine | Admitting: Emergency Medicine

## 2016-09-09 DIAGNOSIS — Y998 Other external cause status: Secondary | ICD-10-CM | POA: Insufficient documentation

## 2016-09-09 DIAGNOSIS — Z7984 Long term (current) use of oral hypoglycemic drugs: Secondary | ICD-10-CM | POA: Insufficient documentation

## 2016-09-09 DIAGNOSIS — Z85828 Personal history of other malignant neoplasm of skin: Secondary | ICD-10-CM | POA: Insufficient documentation

## 2016-09-09 DIAGNOSIS — M25551 Pain in right hip: Secondary | ICD-10-CM

## 2016-09-09 DIAGNOSIS — S32501A Unspecified fracture of right pubis, initial encounter for closed fracture: Secondary | ICD-10-CM | POA: Diagnosis not present

## 2016-09-09 DIAGNOSIS — E119 Type 2 diabetes mellitus without complications: Secondary | ICD-10-CM | POA: Insufficient documentation

## 2016-09-09 DIAGNOSIS — S32591A Other specified fracture of right pubis, initial encounter for closed fracture: Secondary | ICD-10-CM

## 2016-09-09 DIAGNOSIS — S79911A Unspecified injury of right hip, initial encounter: Secondary | ICD-10-CM | POA: Diagnosis not present

## 2016-09-09 DIAGNOSIS — Y9389 Activity, other specified: Secondary | ICD-10-CM | POA: Diagnosis not present

## 2016-09-09 DIAGNOSIS — Y92009 Unspecified place in unspecified non-institutional (private) residence as the place of occurrence of the external cause: Secondary | ICD-10-CM | POA: Diagnosis not present

## 2016-09-09 DIAGNOSIS — S32511A Fracture of superior rim of right pubis, initial encounter for closed fracture: Secondary | ICD-10-CM | POA: Diagnosis not present

## 2016-09-09 DIAGNOSIS — W010XXA Fall on same level from slipping, tripping and stumbling without subsequent striking against object, initial encounter: Secondary | ICD-10-CM | POA: Insufficient documentation

## 2016-09-09 DIAGNOSIS — Z96652 Presence of left artificial knee joint: Secondary | ICD-10-CM | POA: Insufficient documentation

## 2016-09-09 DIAGNOSIS — Z8673 Personal history of transient ischemic attack (TIA), and cerebral infarction without residual deficits: Secondary | ICD-10-CM | POA: Insufficient documentation

## 2016-09-09 DIAGNOSIS — W19XXXA Unspecified fall, initial encounter: Secondary | ICD-10-CM

## 2016-09-09 LAB — CBC WITH DIFFERENTIAL/PLATELET
BASOS ABS: 0 10*3/uL (ref 0.0–0.1)
BASOS PCT: 0 %
Eosinophils Absolute: 0.1 10*3/uL (ref 0.0–0.7)
Eosinophils Relative: 1 %
HEMATOCRIT: 39.4 % (ref 39.0–52.0)
HEMOGLOBIN: 12.6 g/dL — AB (ref 13.0–17.0)
LYMPHS PCT: 11 %
Lymphs Abs: 1 10*3/uL (ref 0.7–4.0)
MCH: 28.6 pg (ref 26.0–34.0)
MCHC: 32 g/dL (ref 30.0–36.0)
MCV: 89.5 fL (ref 78.0–100.0)
MONO ABS: 1 10*3/uL (ref 0.1–1.0)
MONOS PCT: 12 %
NEUTROS ABS: 6.8 10*3/uL (ref 1.7–7.7)
NEUTROS PCT: 76 %
Platelets: 189 10*3/uL (ref 150–400)
RBC: 4.4 MIL/uL (ref 4.22–5.81)
RDW: 14.5 % (ref 11.5–15.5)
WBC: 8.9 10*3/uL (ref 4.0–10.5)

## 2016-09-09 LAB — BASIC METABOLIC PANEL
ANION GAP: 7 (ref 5–15)
BUN: 32 mg/dL — ABNORMAL HIGH (ref 6–20)
CALCIUM: 8.8 mg/dL — AB (ref 8.9–10.3)
CHLORIDE: 107 mmol/L (ref 101–111)
CO2: 22 mmol/L (ref 22–32)
Creatinine, Ser: 1.03 mg/dL (ref 0.61–1.24)
GFR calc non Af Amer: >60 mL/min (ref >60)
GLUCOSE: 126 mg/dL — AB (ref 65–99)
POTASSIUM: 4.3 mmol/L (ref 3.5–5.1)
Sodium: 136 mmol/L (ref 135–145)

## 2016-09-09 MED ORDER — ACETAMINOPHEN 325 MG PO TABS: 650.0000 mg | ORAL_TABLET | Freq: Four times a day (QID) | ORAL | 0 refills | Status: DC | PRN

## 2016-09-09 NOTE — ED Notes (Signed)
Pt verbalized understanding discharge instructions and denies any further needs or questions at this time. VS stable, ambulatory and steady gait.   

## 2016-09-09 NOTE — Discharge Instructions (Signed)
CT scan showed fracture of your right pubic ramus. Please weight bear as tolerated and follow up with orthopedic surgeon Dr. Stann Mainland.

## 2016-09-09 NOTE — ED Provider Notes (Signed)
[facility] DEPT Provider Note   CSN: [id number] Arrival date & time: 09/09/16  1825     History   Chief Complaint Chief Complaint  Patient presents with  . Fall  . Hip Pain    HPI Tyler Greer is a 81 y.o. male.  Tyler Greer is a 81 y.o. Male presents to the emergency department complaining of right hip pain after a fall prior to arrival today. Patient reports he is working outside with the hose around 6 PM this evening when he tripped on the hose causing him to land on his right hip and his right elbow. He denies any numbness or loss of consciousness. He reports worsening pain with ambulation and has been using a walker since this evening to ambulate. He reports significant pain with bearing weight on his right side. He reports it feels that something is moving in his hip. He denies anticoagulation use. He denies fevers, coughing, abdominal pain, nausea, vomiting, numbness, tingling, weakness, head injury, headache, lightheadedness, dizziness, syncope, chest pain or rashes.   The history is provided by the patient and medical records. No language interpreter was used.  Fall  Pertinent negatives include no chest pain, no abdominal pain, no headaches and no shortness of breath.  Hip Pain  Pertinent negatives include no chest pain, no abdominal pain, no headaches and no shortness of breath.    Past Medical History:  Diagnosis Date  . Arthritis    "hands and knees" (06/10/2015)  . Basal cell carcinoma, arm    "both"  . Basal cell carcinoma, face   . Diabetes mellitus type II 10/2007  . Intention tremor   . Migraine    "none in years" (06/10/2015)  . Osteoarthritis   . Paresthesia of foot    bilateral  . Stroke ([place]) 2005   mini stroke    Patient Active Problem List   Diagnosis Date Noted  . Pseudogout 07/22/2016  . Diabetes mellitus with neuropathy ([place]) 06/30/2015  . Status post total left knee replacement 06/10/2015  . Diabetic neuropathy ([place]) 08/28/2013    . Testosterone deficiency 07/27/2010  . Dyslipidemia 07/27/2010  . ACTION TREMOR 12/07/2006  . Osteoarthritis 12/07/2006    Past Surgical History:  Procedure Laterality Date  . BACK SURGERY    . HAMMER TOE SURGERY Bilateral   . JOINT REPLACEMENT    . KNEE ARTHROPLASTY Left 06/10/2015   Procedure: COMPUTER ASSISTED TOTAL KNEE ARTHROPLASTY;  Surgeon: Marybelle Killings, MD;  Location: [facility];  Service: Orthopedics;  Laterality: Left;  . KNEE ARTHROSCOPY Bilateral 2002  . LUMBAR LAMINECTOMY  X 2  . TONSILLECTOMY    . TOTAL KNEE ARTHROPLASTY Left 06/10/2015       Home Medications    Prior to Admission medications   Medication Sig Start Date End Date Taking? Authorizing Provider  acetaminophen (TYLENOL) 325 MG tablet Take 2 tablets (650 mg total) by mouth every 6 (six) hours as needed for mild pain or moderate pain. 09/09/16   Waynetta Pean, PA-C  metFORMIN (GLUCOPHAGE-XR) 500 MG 24 hr tablet Take 1 tablet (500 mg total) by mouth daily with breakfast. 07/22/16   Marletta Lor, MD  Triamcinolone Acetonide (TRIAMCINOLONE 0.1 % CREAM : EUCERIN) CREA Apply 1 application topically 2 (two) times daily. 05/23/16   Marletta Lor, MD    Family History Family History  Problem Relation Age of Onset  . Heart disease Father        died age 70-[place]  . Diabetes type II  Brother   . Melanoma Brother     Social History Social History  Substance Use Topics  . Smoking status: Never Smoker  . Smokeless tobacco: Never Used     Comment: "might have smoked 1 pack of cigarettes in my life"  . Alcohol use No     Allergies   Patient has no known allergies.   Review of Systems Review of Systems  Constitutional: Negative for fever.  HENT: Negative for congestion and sore throat.   Eyes: Negative for visual disturbance.  Respiratory: Negative for cough and shortness of breath.   Cardiovascular: Negative for chest pain.  Gastrointestinal: Negative for abdominal pain, diarrhea, nausea and  vomiting.  Genitourinary: Negative for dysuria.  Musculoskeletal: Positive for arthralgias and gait problem. Negative for back pain and neck pain.  Skin: Positive for wound. Negative for rash.  Neurological: Negative for syncope, weakness, light-headedness, numbness and headaches.     Physical Exam Updated Vital Signs BP 126/79   Pulse 71   Temp 98 F (36.7 C) (Oral)   Resp 17   Ht 5\' 11"  (1.803 m)   Wt 94.3 kg (208 lb)   SpO2 96%   BMI 29.01 kg/m   Physical Exam  Constitutional: He appears well-developed and well-nourished. No distress.  Nontoxic appearing.  HENT:  Head: Normocephalic and atraumatic.  Right Ear: External ear normal.  Left Ear: External ear normal.  Mouth/Throat: Oropharynx is clear and moist.  Eyes: Pupils are equal, round, and reactive to light. Conjunctivae are normal. Right eye exhibits no discharge. Left eye exhibits no discharge.  Neck: Neck supple.  Cardiovascular: Normal rate, regular rhythm, normal heart sounds and intact distal pulses.  Exam reveals no gallop and no friction rub.   No murmur heard. Bilateral radial, posterior tibialis and dorsalis pedis pulses are intact.    Pulmonary/Chest: Effort normal and breath sounds normal. No respiratory distress. He has no rales. He exhibits no tenderness.  Lungs are clear to ascultation bilaterally. Symmetric chest expansion bilaterally. No increased work of breathing. No rales or rhonchi.    Abdominal: Soft. There is no tenderness. There is no guarding.  Musculoskeletal: Normal range of motion. He exhibits no edema, tenderness or deformity.  Patient is good range of motion of his bilateral hips. He reports pain with flexion at his hip. No midline back tenderness to palpation. No pelvic instability noted. No leg shortening or rotation. No tenderness to his bilateral knees or ankles. Superficial, non-bleeding abrasion to his right elbow. Good ROM of his right elbow without difficulty or pain. No deformity. No  midline neck or back TTP. No clavicle TTP bilaterally.   Lymphadenopathy:    He has no cervical adenopathy.  Neurological: He is alert. No sensory deficit. He exhibits normal muscle tone. Coordination normal.  Skin: Skin is warm and dry. Capillary refill takes less than 2 seconds. No rash noted. He is not diaphoretic. No erythema. No pallor.  Psychiatric: He has a normal mood and affect. His behavior is normal.  Nursing note and vitals reviewed.    ED Treatments / Results  Labs (all labs ordered are listed, but only abnormal results are displayed) Labs Reviewed  BASIC METABOLIC PANEL - Abnormal; Notable for the following:       Result Value   Glucose, Bld 126 (*)    BUN 32 (*)    Calcium 8.8 (*)    All other components within normal limits  CBC WITH DIFFERENTIAL/PLATELET - Abnormal; Notable for the following:  Hemoglobin 12.6 (*)    All other components within normal limits    EKG  EKG Interpretation None       Radiology Ct Pelvis Wo Contrast  Result Date: 09/09/2016 CLINICAL DATA:  81 y/o  M; fall on right hip with right hip pain. EXAM: CT PELVIS WITHOUT CONTRAST TECHNIQUE: Multidetector CT imaging of the pelvis was performed following the standard protocol without intravenous contrast. COMPARISON:  09/09/2016 pelvis and right hip radiographs. FINDINGS: Urinary Tract:  No abnormality visualized. Bowel:  Scattered diverticulosis. Vascular/Lymphatic: Calcific atherosclerosis of the abdominal aorta and bilateral iliofemoral arteries. No adenopathy identified. Reproductive:  Prostatic calcification. Other:  None. Musculoskeletal: Disc and facet degenerative changes. Moderate bilateral bony foraminal narrowing at the L4-5 and L5-S1 levels. Mild osteoarthrosis of the hip joints with acetabular productive and fibrocystic changes. Mild superior loss of the hip joint spaces. Mildly comminuted and displaced fracture of the superior pubic ramus near the symphysis (series 5, image 40).  Nondisplaced fracture of the right inferior pubic ramus (series 5, image 64). IMPRESSION: Mildly comminuted and displaced acute fracture of the right superior pubic ramus and nondisplaced fracture of the right inferior pubic ramus. No other fracture or dislocation identified. Electronically Signed   By: Kristine Garbe M.D.   On: 09/09/2016 22:24   Dg Hip Unilat  With Pelvis 2-3 Views Right  Result Date: 09/09/2016 CLINICAL DATA:  Fall.  Right hip pain. EXAM: DG HIP (WITH OR WITHOUT PELVIS) 2-3V RIGHT COMPARISON:  None. FINDINGS: Frontal pelvis shows normal SI joints. Symphysis pubis unremarkable. Linear lucency in the right superior pubic ramus likely Mach effect from gas in the rectum. No displaced fracture of the right superior or inferior pubic ramus is evident. Joint space in the hips is preserved and symmetric. AP and frog-leg lateral views of the right hip show no femoral neck fracture. IMPRESSION: No evidence for right femoral neck fracture. Cortical irregularity is seen along the superior and inferior pubic rami bilaterally, hindering exclusion of nondisplaced pubic ramus fracture. If there is clinical concern for fracture of the pubic rami, oblique pelvic these may prove helpful. Electronically Signed   By: Misty Stanley M.D.   On: 09/09/2016 19:49    Procedures Procedures (including critical care time)  Medications Ordered in ED Medications - No data to display   Initial Impression / Assessment and Plan / ED Course  I have reviewed the triage vital signs and the nursing notes.  Pertinent labs & imaging results that were available during my care of the patient were reviewed by me and considered in my medical decision making (see chart for details).    This is a 81 y.o. Male presents to the emergency department complaining of right hip pain after a fall prior to arrival today. Patient reports he is working outside with the hose around 6 PM this evening when he tripped on the  hose causing him to land on his right hip and his right elbow. He denies any numbness or loss of consciousness. He reports worsening pain with ambulation and has been using a walker since this evening to ambulate. He reports significant pain with bearing weight on his right side. He reports it feels that something is moving in his hip. He denies anticoagulation use. On exam the patient is afebrile nontoxic appearing. His neurovascular intact. He ambulates in the room with his walker without difficulty. He does report pain with weightbearing. He declines any pain medicine in the emergency department. No leg shortening or rotation. No  deformities noted. He reports last Tdap was less than 5 years ago by his PCP.  X-ray of his right hip and pelvis show no evidence for right femoral neck fracture. Additional cortical irregularity along the superior and inferior pubic rami, concerning for muscle fracture of the pubic rami. Will obtain CT scan. CT pelvis reveals a mildly comminuted and displaced acute fracture of the right superior pubic ramus and nondisplaced fracture of the right inferior pubic ramus. No other fracture or dislocation identified.  I consulted with orthopedic surgeon Dr. Stann Mainland. He reports as patient can ambulate in the emergency department he can weight bear as tolerated with his walker and can follow up in about 2 weeks as an outpatient with him.   I discussed this plan with patient as he ambulates around the room with his walker. He is very agreeable to this plan. He is happy he can be discharged. He declines any pain medication. He tells me he can take an ibuprofen if he needs it. I discussed him using Tylenol instead. Patient agrees. He declines any further pain medications. I discussed strict and specific return precautions. I advised the patient to follow-up with their primary care provider this week. I advised the patient to return to the emergency department with new or worsening symptoms  or new concerns. The patient verbalized understanding and agreement with plan.    This patient was discussed with and evaluated by Dr. Lacinda Axon who agrees with assessment and plan.   Final Clinical Impressions(s) / ED Diagnoses   Final diagnoses:  Pubic ramus fracture, right, closed, initial encounter ([place])  Fall, initial encounter  Right hip pain    New Prescriptions New Prescriptions   ACETAMINOPHEN (TYLENOL) 325 MG TABLET    Take 2 tablets (650 mg total) by mouth every 6 (six) hours as needed for mild pain or moderate pain.     Waynetta Pean, PA-C 09/09/16 2318    Nat Christen, MD 09/10/16 [phone]

## 2016-09-09 NOTE — ED Triage Notes (Signed)
Pt reports falling tripping on something and falling today. Was able to ambulate but feels like something is "popping or moving" in right hip, difficulty bearing weight and has pain into his groin with movement.

## 2016-09-16 ENCOUNTER — Other Ambulatory Visit: Payer: Self-pay

## 2016-09-19 DIAGNOSIS — S32591A Other specified fracture of right pubis, initial encounter for closed fracture: Secondary | ICD-10-CM | POA: Diagnosis not present

## 2016-09-30 DIAGNOSIS — S32591D Other specified fracture of right pubis, subsequent encounter for fracture with routine healing: Secondary | ICD-10-CM | POA: Diagnosis not present

## 2016-10-05 DIAGNOSIS — S32591D Other specified fracture of right pubis, subsequent encounter for fracture with routine healing: Secondary | ICD-10-CM | POA: Diagnosis not present

## 2016-10-11 DIAGNOSIS — M7062 Trochanteric bursitis, left hip: Secondary | ICD-10-CM | POA: Diagnosis not present

## 2016-10-11 DIAGNOSIS — S32591D Other specified fracture of right pubis, subsequent encounter for fracture with routine healing: Secondary | ICD-10-CM | POA: Diagnosis not present

## 2016-10-12 DIAGNOSIS — S32591D Other specified fracture of right pubis, subsequent encounter for fracture with routine healing: Secondary | ICD-10-CM | POA: Diagnosis not present

## 2016-10-17 DIAGNOSIS — S32591D Other specified fracture of right pubis, subsequent encounter for fracture with routine healing: Secondary | ICD-10-CM | POA: Diagnosis not present

## 2016-10-20 DIAGNOSIS — S32591D Other specified fracture of right pubis, subsequent encounter for fracture with routine healing: Secondary | ICD-10-CM | POA: Diagnosis not present

## 2016-10-21 DIAGNOSIS — M7062 Trochanteric bursitis, left hip: Secondary | ICD-10-CM | POA: Diagnosis not present

## 2016-10-31 DIAGNOSIS — M7062 Trochanteric bursitis, left hip: Secondary | ICD-10-CM | POA: Diagnosis not present

## 2016-10-31 DIAGNOSIS — M5416 Radiculopathy, lumbar region: Secondary | ICD-10-CM | POA: Diagnosis not present

## 2016-11-07 DIAGNOSIS — M5416 Radiculopathy, lumbar region: Secondary | ICD-10-CM | POA: Diagnosis not present

## 2016-11-09 DIAGNOSIS — M5416 Radiculopathy, lumbar region: Secondary | ICD-10-CM | POA: Diagnosis not present

## 2016-11-09 DIAGNOSIS — M8448XA Pathological fracture, other site, initial encounter for fracture: Secondary | ICD-10-CM | POA: Diagnosis not present

## 2016-11-15 ENCOUNTER — Other Ambulatory Visit: Payer: Self-pay | Admitting: Orthopedic Surgery

## 2016-11-15 DIAGNOSIS — M5416 Radiculopathy, lumbar region: Secondary | ICD-10-CM | POA: Diagnosis not present

## 2016-11-29 ENCOUNTER — Ambulatory Visit
Admission: RE | Admit: 2016-11-29 | Discharge: 2016-11-29 | Disposition: A | Discharge status: Home / Self Care | Payer: Medicare Other | Source: Ambulatory Visit | Attending: Orthopedic Surgery | Admitting: Orthopedic Surgery

## 2016-11-29 DIAGNOSIS — M48061 Spinal stenosis, lumbar region without neurogenic claudication: Secondary | ICD-10-CM | POA: Diagnosis not present

## 2016-11-29 DIAGNOSIS — M5416 Radiculopathy, lumbar region: Secondary | ICD-10-CM

## 2016-11-29 MED ORDER — ONDANSETRON 8 MG PO TBDP
8.0000 mg | ORAL_TABLET | Freq: Once | ORAL | Status: AC
  Administered 2016-11-29: 8 mg via ORAL

## 2016-11-29 MED ORDER — IOPAMIDOL (ISOVUE-M 200) INJECTION 41%
15.0000 mL | Freq: Once | INTRAMUSCULAR | Status: AC
  Administered 2016-11-29: 15 mL via INTRATHECAL

## 2016-11-29 MED ORDER — DIAZEPAM 5 MG PO TABS
5.0000 mg | ORAL_TABLET | Freq: Once | ORAL | Status: AC
  Administered 2016-11-29: 5 mg via ORAL

## 2016-11-29 NOTE — Discharge Instructions (Signed)
[facility] Discharge Instructions  1. Go home and rest quietly for the next 24 hours.  It is important to lie flat for the next 24 hours.  Get up only to go to the restroom.  You may lie in the bed or on a couch on your back, your stomach, your left side or your right side.  You may have one pillow under your head.  You may have pillows between your knees while you are on your side or under your knees while you are on your back.  2. DO NOT drive today.  Recline the seat as far back as it will go, while still wearing your seat belt, on the way home.  3. You may get up to go to the bathroom as needed.  You may sit up for 10 minutes to eat.  You may resume your normal diet and medications unless otherwise indicated.  Drink plenty of extra fluids today and tomorrow.  4. The incidence of a spinal headache with nausea and/or vomiting is about 5% (one in 20 patients).  If you develop a headache, lie flat and drink plenty of fluids until the headache goes away.  Caffeinated beverages may be helpful.  If you develop severe nausea and vomiting or a headache that does not go away with flat bed rest, call [phone].  5. You may resume normal activities after your 24 hours of bed rest is over; however, do not exert yourself strongly or do any heavy lifting tomorrow.  6. Call your physician for a follow-up appointment.   You may resume Aspirin today.

## 2016-11-29 NOTE — Progress Notes (Signed)
Patient states he has been off Aspirin 325mg  two daily for at least the past three days.  Brita Romp, RN

## 2016-12-06 DIAGNOSIS — M48062 Spinal stenosis, lumbar region with neurogenic claudication: Secondary | ICD-10-CM | POA: Diagnosis not present

## 2016-12-06 DIAGNOSIS — M5416 Radiculopathy, lumbar region: Secondary | ICD-10-CM | POA: Diagnosis not present

## 2016-12-15 ENCOUNTER — Ambulatory Visit (INDEPENDENT_AMBULATORY_CARE_PROVIDER_SITE_OTHER): Payer: Medicare Other

## 2016-12-15 ENCOUNTER — Ambulatory Visit: Payer: No Typology Code available for payment source

## 2016-12-15 DIAGNOSIS — Z23 Encounter for immunization: Secondary | ICD-10-CM | POA: Diagnosis not present

## 2016-12-31 DIAGNOSIS — M48062 Spinal stenosis, lumbar region with neurogenic claudication: Secondary | ICD-10-CM | POA: Diagnosis not present

## 2016-12-31 DIAGNOSIS — M5416 Radiculopathy, lumbar region: Secondary | ICD-10-CM | POA: Diagnosis not present

## 2017-01-23 ENCOUNTER — Ambulatory Visit: Payer: Medicare Other | Admitting: Internal Medicine

## 2017-01-23 DIAGNOSIS — L570 Actinic keratosis: Secondary | ICD-10-CM | POA: Diagnosis not present

## 2017-01-23 DIAGNOSIS — L821 Other seborrheic keratosis: Secondary | ICD-10-CM | POA: Diagnosis not present

## 2017-01-23 DIAGNOSIS — Z85828 Personal history of other malignant neoplasm of skin: Secondary | ICD-10-CM | POA: Diagnosis not present

## 2017-01-24 ENCOUNTER — Encounter: Payer: Self-pay | Admitting: Internal Medicine

## 2017-01-24 ENCOUNTER — Ambulatory Visit (INDEPENDENT_AMBULATORY_CARE_PROVIDER_SITE_OTHER): Payer: Medicare Other | Admitting: Internal Medicine

## 2017-01-24 VITALS — BP 120/62 | HR 73 | Temp 97.5°F | Ht 71.0 in | Wt 204.4 lb

## 2017-01-24 DIAGNOSIS — M150 Primary generalized (osteo)arthritis: Secondary | ICD-10-CM | POA: Diagnosis not present

## 2017-01-24 DIAGNOSIS — M159 Polyosteoarthritis, unspecified: Secondary | ICD-10-CM

## 2017-01-24 DIAGNOSIS — E785 Hyperlipidemia, unspecified: Secondary | ICD-10-CM

## 2017-01-24 DIAGNOSIS — E0849 Diabetes mellitus due to underlying condition with other diabetic neurological complication: Secondary | ICD-10-CM

## 2017-01-24 LAB — POCT GLYCOSYLATED HEMOGLOBIN (HGB A1C): Hemoglobin A1C: 6.4

## 2017-01-24 NOTE — Patient Instructions (Signed)
Limit your sodium (Salt) intake   Please check your hemoglobin A1c every 6 months  Please see your eye doctor yearly to check for diabetic eye damage   Return in 6 months for follow-up

## 2017-01-24 NOTE — Progress Notes (Signed)
Subjective:    Patient ID: Tyler Greer, male    DOB: 06-Aug-1935, 81 y.o.   MRN: [id number]  HPI  Lab Results  Component Value Date   HGBA1C 6.2 06/20/2016    81 year old patient who is seen today for follow-up.  He has a history of type 2 diabetes. He was seen in the ED in July with a pelvic fracture.  Presently doing well. No new concerns or complaints.  He is on metformin but otherwise no medications.  He remains quite active.  No recent eye examination  He continues to have pain involving the left knee and also feels that he may need right total knee replacement therapy.  He is now being seen at [facility] orthopedics  Past Medical History:  Diagnosis Date  . Arthritis    "hands and knees" (06/10/2015)  . Basal cell carcinoma, arm    "both"  . Basal cell carcinoma, face   . Diabetes mellitus type II 10/2007  . Intention tremor   . Migraine    "none in years" (06/10/2015)  . Osteoarthritis   . Paresthesia of foot    bilateral  . Stroke ([place]) 2005   mini stroke     Social History   Socioeconomic History  . Marital status: Widowed    Spouse name: Not on file  . Number of children: Not on file  . Years of education: Not on file  . Highest education level: Not on file  Social Needs  . Financial resource strain: Not on file  . Food insecurity - worry: Not on file  . Food insecurity - inability: Not on file  . Transportation needs - medical: Not on file  . Transportation needs - non-medical: Not on file  Occupational History  . Not on file  Tobacco Use  . Smoking status: Never Smoker  . Smokeless tobacco: Never Used  . Tobacco comment: "might have smoked 1 pack of cigarettes in my life"  Substance and Sexual Activity  . Alcohol use: No  . Drug use: No  . Sexual activity: Not Currently  Other Topics Concern  . Not on file  Social History Narrative   Designated third party release signed on 07/13/09    Past Surgical History:  Procedure Laterality Date   . BACK SURGERY    . HAMMER TOE SURGERY Bilateral   . JOINT REPLACEMENT    . KNEE ARTHROPLASTY Left 06/10/2015   Procedure: COMPUTER ASSISTED TOTAL KNEE ARTHROPLASTY;  Surgeon: Marybelle Killings, MD;  Location: [place];  Service: Orthopedics;  Laterality: Left;  . KNEE ARTHROSCOPY Bilateral 2002  . LUMBAR LAMINECTOMY  X 2  . TONSILLECTOMY    . TOTAL KNEE ARTHROPLASTY Left 06/10/2015    Family History  Problem Relation Age of Onset  . Heart disease Father        died age 77-[place]  . Diabetes type II Brother   . Melanoma Brother     No Known Allergies  Current Outpatient Medications on File Prior to Visit  Medication Sig Dispense Refill  . metFORMIN (GLUCOPHAGE-XR) 500 MG 24 hr tablet Take 1 tablet (500 mg total) by mouth daily with breakfast. 180 tablet 1  . acetaminophen (TYLENOL) 325 MG tablet Take 2 tablets (650 mg total) by mouth every 6 (six) hours as needed for mild pain or moderate pain. (Patient not taking: Reported on 01/24/2017) 60 tablet 0  . aspirin 325 MG tablet Take 325 mg by mouth daily.    . Triamcinolone Acetonide (TRIAMCINOLONE  0.1 % CREAM : EUCERIN) CREA Apply 1 application topically 2 (two) times daily. (Patient not taking: Reported on 01/24/2017) 1 each 3   No current facility-administered medications on file prior to visit.     BP 120/62 (BP Location: Left Arm, Patient Position: Sitting, Cuff Size: Normal)   Pulse 73   Temp (!) 97.5 F (36.4 C) (Oral)   Ht 5\' 11"  (1.803 m)   Wt 204 lb 6.4 oz (92.7 kg)   SpO2 98%   BMI 28.51 kg/m     Review of Systems  Constitutional: Negative for appetite change, chills, fatigue and fever.  HENT: Negative for congestion, dental problem, ear pain, hearing loss, sore throat, tinnitus, trouble swallowing and voice change.   Eyes: Negative for pain, discharge and visual disturbance.  Respiratory: Negative for cough, chest tightness, wheezing and stridor.   Cardiovascular: Negative for chest pain, palpitations and leg swelling.    Gastrointestinal: Negative for abdominal distention, abdominal pain, blood in stool, constipation, diarrhea, nausea and vomiting.  Genitourinary: Negative for difficulty urinating, discharge, flank pain, genital sores, hematuria and urgency.  Musculoskeletal: Negative for arthralgias, back pain, gait problem, joint swelling, myalgias and neck stiffness.       Bilateral knee pain  Skin: Negative for rash.  Neurological: Negative for dizziness, syncope, speech difficulty, weakness, numbness and headaches.  Hematological: Negative for adenopathy. Does not bruise/bleed easily.  Psychiatric/Behavioral: Negative for behavioral problems and dysphoric mood. The patient is not nervous/anxious.        Objective:   Physical Exam  Constitutional: He is oriented to person, place, and time. He appears well-developed.  HENT:  Head: Normocephalic.  Right Ear: External ear normal.  Left Ear: External ear normal.  Eyes: Conjunctivae and EOM are normal.  Neck: Normal range of motion.  Cardiovascular: Normal rate and normal heart sounds.  Pulmonary/Chest: Breath sounds normal.  Abdominal: Bowel sounds are normal.  Musculoskeletal: Normal range of motion. He exhibits no edema or tenderness.  Neurological: He is alert and oriented to person, place, and time.  Psychiatric: He has a normal mood and affect. His behavior is normal.          Assessment & Plan:   Diabetes mellitus.  Will review a hemoglobin A1c.  Continue metformin Osteoarthritis.  Status post left total knee replacement surgery now with right knee pain.  Orthopedic follow-up Status post pelvic fracture History of essential tremor  Eye examination encouraged CPX 6 months  Tyler Greer

## 2017-02-09 DIAGNOSIS — M19121 Post-traumatic osteoarthritis, right elbow: Secondary | ICD-10-CM | POA: Diagnosis not present

## 2017-03-02 DIAGNOSIS — M25521 Pain in right elbow: Secondary | ICD-10-CM | POA: Diagnosis not present

## 2017-07-03 ENCOUNTER — Ambulatory Visit: Payer: Self-pay | Admitting: *Deleted

## 2017-07-03 NOTE — Telephone Encounter (Signed)
Called in c/o having a sharp chest pain in left breast area that happens about twice a month since the first of the year.   Denies any other symptoms with it.  My granddaughter is a Marine scientist and when I told her about it she told me I should have it checked out. See triage notes.  I made an appt with Dr. Burnice Logan for 07/06/17 at 11:30 at the pt's request.  The protocol was for 3 days but he mentioned Thursday would work better.   Since this has been ongoing since the first of the year I felt he was ok to wait the extra day.    I let him know to go to the ED if his symptoms become worse or he has symptoms with the chest pain.   Reason for Disposition . [1] Chest pain(s) lasting a few seconds AND [2] persists > 3 days  Answer Assessment - Initial Assessment Questions 1. LOCATION: "Where does it hurt?"       Started back at first of the year.   It happens twice a month.   Lasts about 30 minutes.   Goes away.   My granddaughter is a Marine scientist and wanted me to call.    Along the left side by my breast. 2. RADIATION: "Does the pain go anywhere else?" (e.g., into neck, jaw, arms, back)     No.   The pain hits suddenly.  It feels like someone is sticking a knife in me.   It happens once or twice a week. 3. ONSET: "When did the chest pain begin?" (Minutes, hours or days)      First of the year. 4. PATTERN "Does the pain come and go, or has it been constant since it started?"  "Does it get worse with exertion?"      Comes and goes. 5. DURATION: "How long does it last" (e.g., seconds, minutes, hours)     Last pain lasted about 15 minutes. 6. SEVERITY: "How bad is the pain?"  (e.g., Scale 1-10; mild, moderate, or severe)    - MILD (1-3): doesn't interfere with normal activities     - MODERATE (4-7): interferes with normal activities or awakens from sleep    - SEVERE (8-10): excruciating pain, unable to do any normal activities       5-6 on pain scale when it hits.    7. CARDIAC RISK FACTORS: "Do you have  any history of heart problems or risk factors for heart disease?" (e.g., prior heart attack, angina; high blood pressure, diabetes, being overweight, high cholesterol, smoking, or strong family history of heart disease)     I had a mild stroke 25 years ago showed up on an MRI.  BP normal.  Diabetic controlled with metformin.  No insulin 8. PULMONARY RISK FACTORS: "Do you have any history of lung disease?"  (e.g., blood clots in lung, asthma, emphysema, birth control pills)     No   Sometimes the pollen bothers me. 9. CAUSE: "What do you think is causing the chest pain?"     I really don't know.    My granddaughter said it could be indigestion. 10. OTHER SYMPTOMS: "Do you have any other symptoms?" (e.g., dizziness, nausea, vomiting, sweating, fever, difficulty breathing, cough)       None 11. PREGNANCY: "Is there any chance you are pregnant?" "When was your last menstrual period?"       N/A  Protocols used: CHEST PAIN-A-AH

## 2017-07-06 ENCOUNTER — Encounter: Payer: Self-pay | Admitting: Internal Medicine

## 2017-07-06 ENCOUNTER — Ambulatory Visit (INDEPENDENT_AMBULATORY_CARE_PROVIDER_SITE_OTHER): Payer: Medicare Other | Admitting: Internal Medicine

## 2017-07-06 VITALS — BP 122/62 | HR 77 | Temp 98.3°F | Wt 218.0 lb

## 2017-07-06 DIAGNOSIS — M150 Primary generalized (osteo)arthritis: Secondary | ICD-10-CM | POA: Diagnosis not present

## 2017-07-06 DIAGNOSIS — M159 Polyosteoarthritis, unspecified: Secondary | ICD-10-CM

## 2017-07-06 DIAGNOSIS — R0789 Other chest pain: Secondary | ICD-10-CM

## 2017-07-06 NOTE — Progress Notes (Signed)
Subjective:    Patient ID: Tyler Greer, male    DOB: May 10, 1935, 82 y.o.   MRN: [id number]  HPI  82 year old patient who is seen today with a chief complaint of left anterior chest pain He states for the past 5 months he has had episodes of left anterior chest pain that occurs once or twice monthly.  He describes the pain as abrupt sharp knifelike and intense with peak intensity at the onset and slow resolution over a few minutes.  No precipitating factors.  Pain usually occurs at rest and during activities such as driving.  No exertional symptoms no associated symptoms such as shortness of breath nausea.  Past Medical History:  Diagnosis Date  . Arthritis    "hands and knees" (06/10/2015)  . Basal cell carcinoma, arm    "both"  . Basal cell carcinoma, face   . Diabetes mellitus type II 10/2007  . Intention tremor   . Migraine    "none in years" (06/10/2015)  . Osteoarthritis   . Paresthesia of foot    bilateral  . Stroke ([place]) 2005   mini stroke     Social History   Socioeconomic History  . Marital status: Widowed    Spouse name: Not on file  . Number of children: Not on file  . Years of education: Not on file  . Highest education level: Not on file  Occupational History  . Not on file  Social Needs  . Financial resource strain: Not on file  . Food insecurity:    Worry: Not on file    Inability: Not on file  . Transportation needs:    Medical: Not on file    Non-medical: Not on file  Tobacco Use  . Smoking status: Never Smoker  . Smokeless tobacco: Never Used  . Tobacco comment: "might have smoked 1 pack of cigarettes in my life"  Substance and Sexual Activity  . Alcohol use: No  . Drug use: No  . Sexual activity: Not Currently  Lifestyle  . Physical activity:    Days per week: Not on file    Minutes per session: Not on file  . Stress: Not on file  Relationships  . Social connections:    Talks on phone: Not on file    Gets together: Not on file   Attends religious service: Not on file    Active member of club or organization: Not on file    Attends meetings of clubs or organizations: Not on file    Relationship status: Not on file  . Intimate partner violence:    Fear of current or ex partner: Not on file    Emotionally abused: Not on file    Physically abused: Not on file    Forced sexual activity: Not on file  Other Topics Concern  . Not on file  Social History Narrative   Designated third party release signed on 07/13/09    Past Surgical History:  Procedure Laterality Date  . BACK SURGERY    . HAMMER TOE SURGERY Bilateral   . JOINT REPLACEMENT    . KNEE ARTHROPLASTY Left 06/10/2015   Procedure: COMPUTER ASSISTED TOTAL KNEE ARTHROPLASTY;  Surgeon: Marybelle Killings, MD;  Location: [place];  Service: Orthopedics;  Laterality: Left;  . KNEE ARTHROSCOPY Bilateral 2002  . LUMBAR LAMINECTOMY  X 2  . TONSILLECTOMY    . TOTAL KNEE ARTHROPLASTY Left 06/10/2015    Family History  Problem Relation Age of Onset  . Heart  disease Father        died age 98-[place]  . Diabetes type II Brother   . Melanoma Brother     No Known Allergies  Current Outpatient Medications on File Prior to Visit  Medication Sig Dispense Refill  . acetaminophen (TYLENOL) 325 MG tablet Take 2 tablets (650 mg total) by mouth every 6 (six) hours as needed for mild pain or moderate pain. 60 tablet 0  . metFORMIN (GLUCOPHAGE-XR) 500 MG 24 hr tablet Take 1 tablet (500 mg total) by mouth daily with breakfast. 180 tablet 1  . Triamcinolone Acetonide (TRIAMCINOLONE 0.1 % CREAM : EUCERIN) CREA Apply 1 application topically 2 (two) times daily. 1 each 3  . aspirin 325 MG tablet Take 325 mg by mouth daily.     No current facility-administered medications on file prior to visit.     BP 122/62 (BP Location: Right Arm, Patient Position: Sitting, Cuff Size: Large)   Pulse 77   Temp 98.3 F (36.8 C) (Oral)   Wt 218 lb (98.9 kg)   SpO2 99%   BMI 30.40 kg/m     Review  of Systems  Constitutional: Negative for appetite change, chills, fatigue and fever.  HENT: Negative for congestion, dental problem, ear pain, hearing loss, sore throat, tinnitus, trouble swallowing and voice change.   Eyes: Negative for pain, discharge and visual disturbance.  Respiratory: Negative for cough, chest tightness, shortness of breath, wheezing and stridor.   Cardiovascular: Positive for chest pain. Negative for palpitations and leg swelling.  Gastrointestinal: Negative for abdominal distention, abdominal pain, blood in stool, constipation, diarrhea, nausea and vomiting.  Genitourinary: Negative for difficulty urinating, discharge, flank pain, genital sores, hematuria and urgency.  Musculoskeletal: Negative for arthralgias, back pain, gait problem, joint swelling, myalgias and neck stiffness.  Skin: Negative for rash.  Neurological: Negative for dizziness, syncope, speech difficulty, weakness, numbness and headaches.  Hematological: Negative for adenopathy. Does not bruise/bleed easily.  Psychiatric/Behavioral: Negative for behavioral problems and dysphoric mood. The patient is not nervous/anxious.        Objective:   Physical Exam  Constitutional: He is oriented to person, place, and time. He appears well-developed and well-nourished. No distress.  Blood pressure normal  HENT:  Head: Normocephalic.  Right Ear: External ear normal.  Left Ear: External ear normal.  Eyes: Conjunctivae and EOM are normal.  Neck: Normal range of motion.  Cardiovascular: Normal rate and normal heart sounds.  Pulmonary/Chest: Breath sounds normal. He exhibits tenderness.  Tenderness over the left anterior chest wall with gentle palpation.  Patient states that this seems to reproduce the chest pain he describes but less severe  Abdominal: Bowel sounds are normal.  Musculoskeletal: Normal range of motion. He exhibits no edema or tenderness.  Neurological: He is alert and oriented to person, place,  and time.  Psychiatric: He has a normal mood and affect. His behavior is normal.          Assessment & Plan:   Paroxysmal chest wall pain.  Patient does have some significant generalized osteoarthritis and suspect pain is coming from a costochondral joint.  Will observe at this point patient does take naproxen as needed  Annual exam as scheduled  [name]

## 2017-07-06 NOTE — Patient Instructions (Signed)
Call or return to clinic prn if these symptoms worsen or fail to improve as anticipated.

## 2017-07-24 DIAGNOSIS — L570 Actinic keratosis: Secondary | ICD-10-CM | POA: Diagnosis not present

## 2017-07-24 DIAGNOSIS — Z85828 Personal history of other malignant neoplasm of skin: Secondary | ICD-10-CM | POA: Diagnosis not present

## 2017-07-24 DIAGNOSIS — L821 Other seborrheic keratosis: Secondary | ICD-10-CM | POA: Diagnosis not present

## 2017-07-24 DIAGNOSIS — L300 Nummular dermatitis: Secondary | ICD-10-CM | POA: Diagnosis not present

## 2017-07-25 ENCOUNTER — Ambulatory Visit (INDEPENDENT_AMBULATORY_CARE_PROVIDER_SITE_OTHER): Payer: Medicare Other | Admitting: Internal Medicine

## 2017-07-25 ENCOUNTER — Encounter: Payer: Self-pay | Admitting: Internal Medicine

## 2017-07-25 VITALS — BP 100/64 | HR 61 | Temp 97.8°F | Wt 215.0 lb

## 2017-07-25 DIAGNOSIS — E785 Hyperlipidemia, unspecified: Secondary | ICD-10-CM | POA: Diagnosis not present

## 2017-07-25 DIAGNOSIS — M150 Primary generalized (osteo)arthritis: Secondary | ICD-10-CM | POA: Diagnosis not present

## 2017-07-25 DIAGNOSIS — E0840 Diabetes mellitus due to underlying condition with diabetic neuropathy, unspecified: Secondary | ICD-10-CM

## 2017-07-25 DIAGNOSIS — Z794 Long term (current) use of insulin: Secondary | ICD-10-CM | POA: Diagnosis not present

## 2017-07-25 DIAGNOSIS — M159 Polyosteoarthritis, unspecified: Secondary | ICD-10-CM

## 2017-07-25 LAB — COMPREHENSIVE METABOLIC PANEL
ALT: 11 U/L (ref 0–53)
AST: 13 U/L (ref 0–37)
Albumin: 3.9 g/dL (ref 3.5–5.2)
Alkaline Phosphatase: 66 U/L (ref 39–117)
BUN: 23 mg/dL (ref 6–23)
CHLORIDE: 104 meq/L (ref 96–112)
CO2: 26 meq/L (ref 19–32)
Calcium: 9.3 mg/dL (ref 8.4–10.5)
Creatinine, Ser: 1.13 mg/dL (ref 0.40–1.50)
GFR: 66.44 mL/min (ref >60.00)
Glucose, Bld: 113 mg/dL — ABNORMAL HIGH (ref 70–99)
Potassium: 4.4 mEq/L (ref 3.5–5.1)
Sodium: 139 mEq/L (ref 135–145)
Total Bilirubin: 0.4 mg/dL (ref 0.2–1.2)
Total Protein: 6.7 g/dL (ref 6.0–8.3)

## 2017-07-25 LAB — CBC WITH DIFFERENTIAL/PLATELET
BASOS PCT: 0.7 % (ref 0.0–3.0)
Basophils Absolute: 0 10*3/uL (ref 0.0–0.1)
EOS PCT: 2.4 % (ref 0.0–5.0)
Eosinophils Absolute: 0.1 10*3/uL (ref 0.0–0.7)
HEMATOCRIT: 41.6 % (ref 39.0–52.0)
Hemoglobin: 14 g/dL (ref 13.0–17.0)
LYMPHS PCT: 20.8 % (ref 12.0–46.0)
Lymphs Abs: 1.1 10*3/uL (ref 0.7–4.0)
MCHC: 33.7 g/dL (ref 30.0–36.0)
MCV: 90.4 fl (ref 78.0–100.0)
MONOS PCT: 10.6 % (ref 3.0–12.0)
Monocytes Absolute: 0.5 10*3/uL (ref 0.1–1.0)
NEUTROS ABS: 3.3 10*3/uL (ref 1.4–7.7)
Neutrophils Relative %: 65.5 % (ref 43.0–77.0)
Platelets: 226 10*3/uL (ref 150.0–400.0)
RBC: 4.6 Mil/uL (ref 4.22–5.81)
RDW: 14.2 % (ref 11.5–15.5)
WBC: 5.1 10*3/uL (ref 4.0–10.5)

## 2017-07-25 LAB — LIPID PANEL
CHOL/HDL RATIO: 7
CHOLESTEROL: 239 mg/dL — AB (ref 0–200)
HDL: 36.7 mg/dL — ABNORMAL LOW (ref >39.00)
NonHDL: 202.1
TRIGLYCERIDES: 348 mg/dL — AB (ref 0.0–149.0)
VLDL: 69.6 mg/dL — AB (ref 0.0–40.0)

## 2017-07-25 LAB — POCT GLYCOSYLATED HEMOGLOBIN (HGB A1C): HEMOGLOBIN A1C: 5.9 % — AB (ref 4.0–5.6)

## 2017-07-25 LAB — MICROALBUMIN / CREATININE URINE RATIO
Creatinine,U: 151.9 mg/dL
MICROALB/CREAT RATIO: 0.5 mg/g (ref 0.0–30.0)
Microalb, Ur: <0.7 mg/dL (ref 0.0–1.9)

## 2017-07-25 LAB — LDL CHOLESTEROL, DIRECT: Direct LDL: 127 mg/dL

## 2017-07-25 LAB — TSH: TSH: 2.34 u[IU]/mL (ref 0.35–4.50)

## 2017-07-25 MED ORDER — METFORMIN HCL ER 500 MG PO TB24: 500.0000 mg | ORAL_TABLET | Freq: Every day | ORAL | 4 refills | Status: DC

## 2017-07-25 NOTE — Progress Notes (Signed)
Subjective:    Patient ID: Tyler Greer, male    DOB: October 22, 1935, 82 y.o.   MRN: [id number]  HPI  82 year old patient who is seen today for follow-up of type 2 diabetes comp gated by neuropathy.  Doing quite well on metformin therapy only  Lab Results  Component Value Date   HGBA1C 5.9 (A) 07/25/2017   He has arthritis And history of BPH.  Doing well today.  Denies any cardiopulmonary complaints  Past Medical History:  Diagnosis Date  . Arthritis    "hands and knees" (06/10/2015)  . Basal cell carcinoma, arm    "both"  . Basal cell carcinoma, face   . Diabetes mellitus type II 10/2007  . Intention tremor   . Migraine    "none in years" (06/10/2015)  . Osteoarthritis   . Paresthesia of foot    bilateral  . Stroke ([place]) 2005   mini stroke     Social History   Socioeconomic History  . Marital status: Widowed    Spouse name: Not on file  . Number of children: Not on file  . Years of education: Not on file  . Highest education level: Not on file  Occupational History  . Not on file  Social Needs  . Financial resource strain: Not on file  . Food insecurity:    Worry: Not on file    Inability: Not on file  . Transportation needs:    Medical: Not on file    Non-medical: Not on file  Tobacco Use  . Smoking status: Never Smoker  . Smokeless tobacco: Never Used  . Tobacco comment: "might have smoked 1 pack of cigarettes in my life"  Substance and Sexual Activity  . Alcohol use: No  . Drug use: No  . Sexual activity: Not Currently  Lifestyle  . Physical activity:    Days per week: Not on file    Minutes per session: Not on file  . Stress: Not on file  Relationships  . Social connections:    Talks on phone: Not on file    Gets together: Not on file    Attends religious service: Not on file    Active member of club or organization: Not on file    Attends meetings of clubs or organizations: Not on file    Relationship status: Not on file  . Intimate  partner violence:    Fear of current or ex partner: Not on file    Emotionally abused: Not on file    Physically abused: Not on file    Forced sexual activity: Not on file  Other Topics Concern  . Not on file  Social History Narrative   Designated third party release signed on 07/13/09    Past Surgical History:  Procedure Laterality Date  . BACK SURGERY    . HAMMER TOE SURGERY Bilateral   . JOINT REPLACEMENT    . KNEE ARTHROPLASTY Left 06/10/2015   Procedure: COMPUTER ASSISTED TOTAL KNEE ARTHROPLASTY;  Surgeon: Marybelle Killings, MD;  Location: [place];  Service: Orthopedics;  Laterality: Left;  . KNEE ARTHROSCOPY Bilateral 2002  . LUMBAR LAMINECTOMY  X 2  . TONSILLECTOMY    . TOTAL KNEE ARTHROPLASTY Left 06/10/2015    Family History  Problem Relation Age of Onset  . Heart disease Father        died age 65-MI  . Diabetes type II Brother   . Melanoma Brother     No Known Allergies  Current Outpatient  Medications on File Prior to Visit  Medication Sig Dispense Refill  . acetaminophen (TYLENOL) 325 MG tablet Take 2 tablets (650 mg total) by mouth every 6 (six) hours as needed for mild pain or moderate pain. 60 tablet 0  . aspirin 325 MG tablet Take 325 mg by mouth daily.    . metFORMIN (GLUCOPHAGE-XR) 500 MG 24 hr tablet Take 1 tablet (500 mg total) by mouth daily with breakfast. 180 tablet 1  . Triamcinolone Acetonide (TRIAMCINOLONE 0.1 % CREAM : EUCERIN) CREA Apply 1 application topically 2 (two) times daily. 1 each 3   No current facility-administered medications on file prior to visit.     BP 100/64 (BP Location: Right Arm, Patient Position: Sitting, Cuff Size: Large)   Pulse 61   Temp 97.8 F (36.6 C) (Oral)   Wt 215 lb (97.5 kg)   SpO2 98%   BMI 29.99 kg/m     Review of Systems  Constitutional: Negative for appetite change, chills, fatigue and fever.  HENT: Negative for congestion, dental problem, ear pain, hearing loss, sore throat, tinnitus, trouble swallowing and  voice change.   Eyes: Negative for pain, discharge and visual disturbance.  Respiratory: Negative for cough, chest tightness, wheezing and stridor.   Cardiovascular: Negative for chest pain, palpitations and leg swelling.  Gastrointestinal: Negative for abdominal distention, abdominal pain, blood in stool, constipation, diarrhea, nausea and vomiting.  Genitourinary: Negative for difficulty urinating, discharge, flank pain, genital sores, hematuria and urgency.  Musculoskeletal: Positive for arthralgias. Negative for back pain, gait problem, joint swelling, myalgias and neck stiffness.  Skin: Negative for rash.  Neurological: Negative for dizziness, syncope, speech difficulty, weakness, numbness and headaches.  Hematological: Negative for adenopathy. Does not bruise/bleed easily.  Psychiatric/Behavioral: Negative for behavioral problems and dysphoric mood. The patient is not nervous/anxious.        Objective:   Physical Exam  Constitutional: He is oriented to person, place, and time. He appears well-developed.  Blood pressure 104/64  HENT:  Head: Normocephalic.  Right Ear: External ear normal.  Left Ear: External ear normal.  Eyes: Conjunctivae and EOM are normal.  Neck: Normal range of motion.  Cardiovascular: Normal rate and normal heart sounds.  Pulmonary/Chest: Breath sounds normal.  Abdominal: Bowel sounds are normal.  Musculoskeletal: Normal range of motion. He exhibits no edema or tenderness.  Neurological: He is alert and oriented to person, place, and time.  Psychiatric: He has a normal mood and affect. His behavior is normal.          Assessment & Plan:   Diabetes mellitus with peripheral neuropathy.  Continue metformin.  Nice glycemic control.  Will check updated lab.  Patient is not interested in statin therapy Osteoarthritis History of mild dyslipidemia BPH  CPX with new provider in 6 months Medications updated  [name]

## 2017-07-25 NOTE — Patient Instructions (Signed)
Please check your hemoglobin A1c every 3-6  Months    It is important that you exercise regularly, at least 20 minutes 3 to 4 times per week.  If you develop chest pain or shortness of breath seek  medical attention.  Please see your eye doctor yearly to check for diabetic eye damage  Return in 6 months for an annual exam

## 2017-08-23 DIAGNOSIS — L570 Actinic keratosis: Secondary | ICD-10-CM | POA: Diagnosis not present

## 2017-08-23 DIAGNOSIS — C4441 Basal cell carcinoma of skin of scalp and neck: Secondary | ICD-10-CM | POA: Diagnosis not present

## 2017-08-23 DIAGNOSIS — Z85828 Personal history of other malignant neoplasm of skin: Secondary | ICD-10-CM | POA: Diagnosis not present

## 2017-08-23 DIAGNOSIS — D485 Neoplasm of uncertain behavior of skin: Secondary | ICD-10-CM | POA: Diagnosis not present

## 2017-08-23 DIAGNOSIS — L300 Nummular dermatitis: Secondary | ICD-10-CM | POA: Diagnosis not present

## 2017-09-27 ENCOUNTER — Ambulatory Visit (INDEPENDENT_AMBULATORY_CARE_PROVIDER_SITE_OTHER): Payer: Medicare Other | Admitting: Adult Health

## 2017-09-27 ENCOUNTER — Encounter: Payer: Self-pay | Admitting: Adult Health

## 2017-09-27 VITALS — BP 122/60 | Temp 98.5°F | Wt 217.0 lb

## 2017-09-27 DIAGNOSIS — E785 Hyperlipidemia, unspecified: Secondary | ICD-10-CM | POA: Diagnosis not present

## 2017-09-27 DIAGNOSIS — M150 Primary generalized (osteo)arthritis: Secondary | ICD-10-CM

## 2017-09-27 DIAGNOSIS — E0840 Diabetes mellitus due to underlying condition with diabetic neuropathy, unspecified: Secondary | ICD-10-CM | POA: Diagnosis not present

## 2017-09-27 DIAGNOSIS — Z7689 Persons encountering health services in other specified circumstances: Secondary | ICD-10-CM

## 2017-09-27 DIAGNOSIS — M159 Polyosteoarthritis, unspecified: Secondary | ICD-10-CM

## 2017-09-27 NOTE — Progress Notes (Signed)
Patient presents to clinic today to establish care. He is a pleasant 82 year old male who  has a past medical history of Arthritis, Basal cell carcinoma, arm, Basal cell carcinoma, face, Diabetes mellitus type II (10/2007), Intention tremor, Migraine, Osteoarthritis, Paresthesia of foot, and Stroke ([place]) (2005).  He is a former patient of Dr. Raliegh Ip who is transferring care to me, due to his PCP retiring from practice  His last physical was in June 2019  Acute Concerns: Establish Care  Chronic Issues: DM -controlled with metformin 500 mg extended release daily. He does not check his BS at home.  Lab Results  Component Value Date   HGBA1C 5.9 (A) 07/25/2017   Hyperlipidemia -not currently on any medication Lab Results  Component Value Date   CHOL 239 (H) 07/25/2017   HDL 36.70 (L) 07/25/2017   LDLCALC 147 (H) 08/28/2013   LDLDIRECT 127.0 07/25/2017   TRIG 348.0 (H) 07/25/2017   CHOLHDL 7 07/25/2017   Arthritis - Takes two Aleve daily PRN.   Health Maintenance: Dental -- Does not do routine care   Vision --  Does not do routine care  Immunizations -- UTD  Colonoscopy --no longer needs  Treatment Team  - [facility] - Annually.   Past Medical History:  Diagnosis Date  . Arthritis    "hands and knees" (06/10/2015)  . Basal cell carcinoma, arm    "both"  . Basal cell carcinoma, face   . Diabetes mellitus type II 10/2007  . Intention tremor   . Migraine    "none in years" (06/10/2015)  . Osteoarthritis   . Paresthesia of foot    bilateral  . Stroke ([place]) 2005   mini stroke    Past Surgical History:  Procedure Laterality Date  . BACK SURGERY    . HAMMER TOE SURGERY Bilateral   . JOINT REPLACEMENT    . KNEE ARTHROPLASTY Left 06/10/2015   Procedure: COMPUTER ASSISTED TOTAL KNEE ARTHROPLASTY;  Surgeon: Marybelle Killings, MD;  Location: [place];  Service: Orthopedics;  Laterality: Left;  . KNEE ARTHROSCOPY Bilateral 2002  . LUMBAR LAMINECTOMY  X 2  . TONSILLECTOMY     . TOTAL KNEE ARTHROPLASTY Left 06/10/2015    Current Outpatient Medications on File Prior to Visit  Medication Sig Dispense Refill  . metFORMIN (GLUCOPHAGE-XR) 500 MG 24 hr tablet Take 1 tablet (500 mg total) by mouth daily with breakfast. 180 tablet 4  . Triamcinolone Acetonide (TRIAMCINOLONE 0.1 % CREAM : EUCERIN) CREA Apply 1 application topically 2 (two) times daily. 1 each 3   No current facility-administered medications on file prior to visit.     No Known Allergies  Family History  Problem Relation Age of Onset  . Heart disease Father        died age 37-MI  . Diabetes type II Brother   . Melanoma Brother     Social History   Socioeconomic History  . Marital status: Widowed    Spouse name: Not on file  . Number of children: Not on file  . Years of education: Not on file  . Highest education level: Not on file  Occupational History  . Not on file  Social Needs  . Financial resource strain: Not on file  . Food insecurity:    Worry: Not on file    Inability: Not on file  . Transportation needs:    Medical: Not on file    Non-medical: Not on file  Tobacco Use  .  Smoking status: Never Smoker  . Smokeless tobacco: Never Used  . Tobacco comment: "might have smoked 1 pack of cigarettes in my life"  Substance and Sexual Activity  . Alcohol use: No  . Drug use: No  . Sexual activity: Not Currently  Lifestyle  . Physical activity:    Days per week: Not on file    Minutes per session: Not on file  . Stress: Not on file  Relationships  . Social connections:    Talks on phone: Not on file    Gets together: Not on file    Attends religious service: Not on file    Active member of club or organization: Not on file    Attends meetings of clubs or organizations: Not on file    Relationship status: Not on file  . Intimate partner violence:    Fear of current or ex partner: Not on file    Emotionally abused: Not on file    Physically abused: Not on file    Forced  sexual activity: Not on file  Other Topics Concern  . Not on file  Social History Narrative   Designated third party release signed on 07/13/09    Review of Systems  Constitutional: Negative.   HENT: Negative.   Respiratory: Negative.   Cardiovascular: Negative.   Gastrointestinal: Negative.   Genitourinary: Negative.   Musculoskeletal: Positive for back pain and joint pain.  Neurological: Negative.   Psychiatric/Behavioral: Negative.     BP 122/60   Temp 98.5 F (36.9 C) (Oral)   Wt 217 lb (98.4 kg)   BMI 30.27 kg/m   Physical Exam  Constitutional: He is oriented to person, place, and time. He appears well-developed and well-nourished. No distress.  Cardiovascular: Normal rate, regular rhythm, normal heart sounds and intact distal pulses.  Pulmonary/Chest: Effort normal and breath sounds normal.  Abdominal: Soft. Bowel sounds are normal. He exhibits no distension and no mass. There is no tenderness. There is no rebound and no guarding. No hernia.  Musculoskeletal: Normal range of motion. He exhibits no edema or deformity.  Neurological: He is alert and oriented to person, place, and time.  Skin: Skin is warm and dry. He is not diaphoretic.  Psychiatric: He has a normal mood and affect. His behavior is normal. Judgment and thought content normal.  Nursing note and vitals reviewed.  Assessment/Plan: 1. Encounter to establish care - Follow up in June 2020 for CPE or sooner if needed  2. Dyslipidemia - encouraged life style modifications   3. Diabetes mellitus due to underlying condition with diabetic neuropathy, without long-term current use of insulin (HCC) - Well controlled.  - No change in medications  - Follow up in December for recheck  4. Primary osteoarthritis involving multiple joints - Ok to continue with aleve PRN   Dorothyann Peng, NP

## 2017-10-25 DIAGNOSIS — L570 Actinic keratosis: Secondary | ICD-10-CM | POA: Diagnosis not present

## 2017-10-25 DIAGNOSIS — Z85828 Personal history of other malignant neoplasm of skin: Secondary | ICD-10-CM | POA: Diagnosis not present

## 2017-12-20 ENCOUNTER — Ambulatory Visit (INDEPENDENT_AMBULATORY_CARE_PROVIDER_SITE_OTHER): Payer: Medicare Other

## 2017-12-20 DIAGNOSIS — Z23 Encounter for immunization: Secondary | ICD-10-CM | POA: Diagnosis not present

## 2018-01-24 DIAGNOSIS — Z85828 Personal history of other malignant neoplasm of skin: Secondary | ICD-10-CM | POA: Diagnosis not present

## 2018-01-24 DIAGNOSIS — L570 Actinic keratosis: Secondary | ICD-10-CM | POA: Diagnosis not present

## 2018-01-24 DIAGNOSIS — L309 Dermatitis, unspecified: Secondary | ICD-10-CM | POA: Diagnosis not present

## 2018-01-24 DIAGNOSIS — L821 Other seborrheic keratosis: Secondary | ICD-10-CM | POA: Diagnosis not present

## 2018-01-28 IMAGING — CR DG CHEST 2V
2 series · 2 of 2 positions shown · non-contrast
Comparison: None.

CLINICAL DATA: Preop for total knee arthroplasty.

EXAM:
CHEST  2 VIEW

[w chest pa]
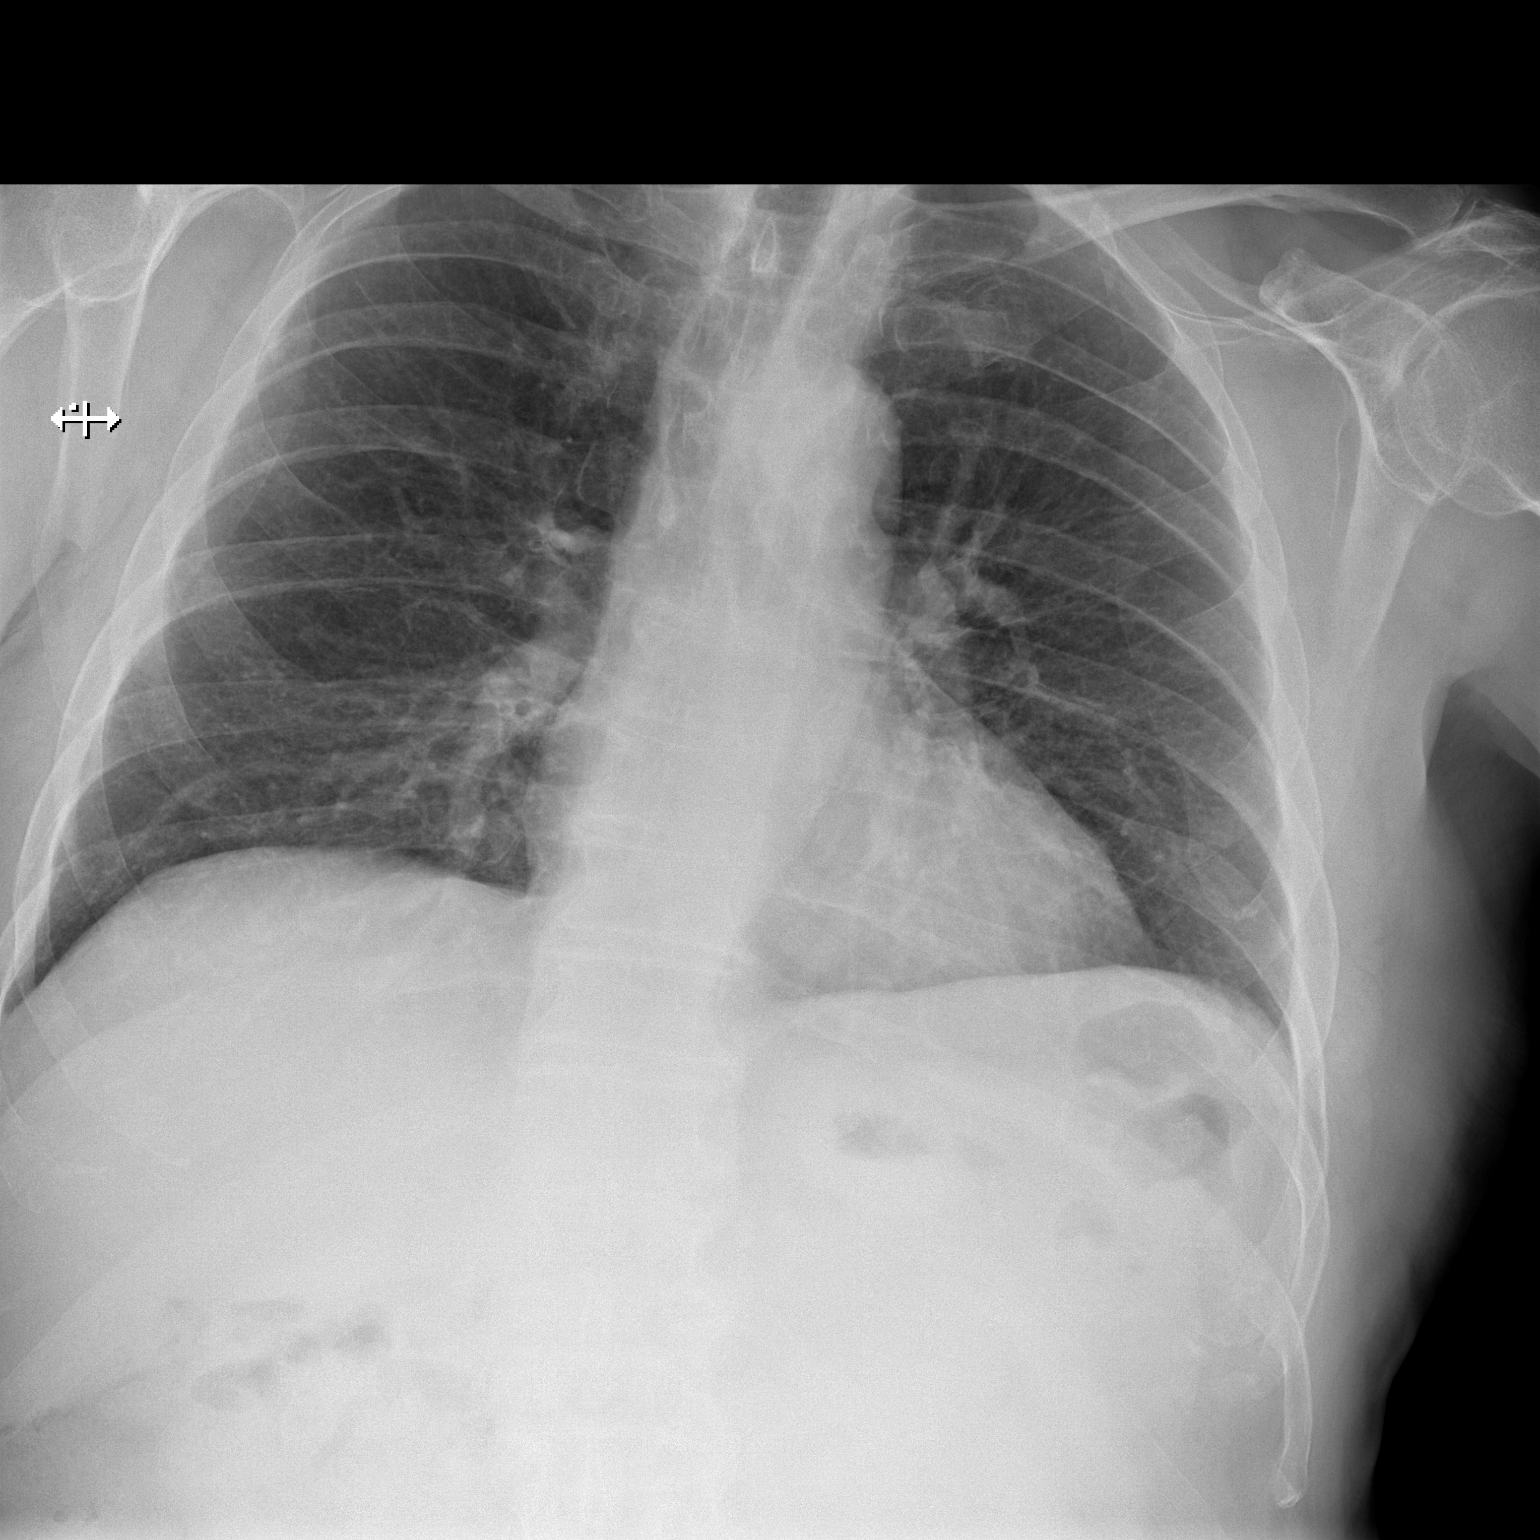

[w chest lat]
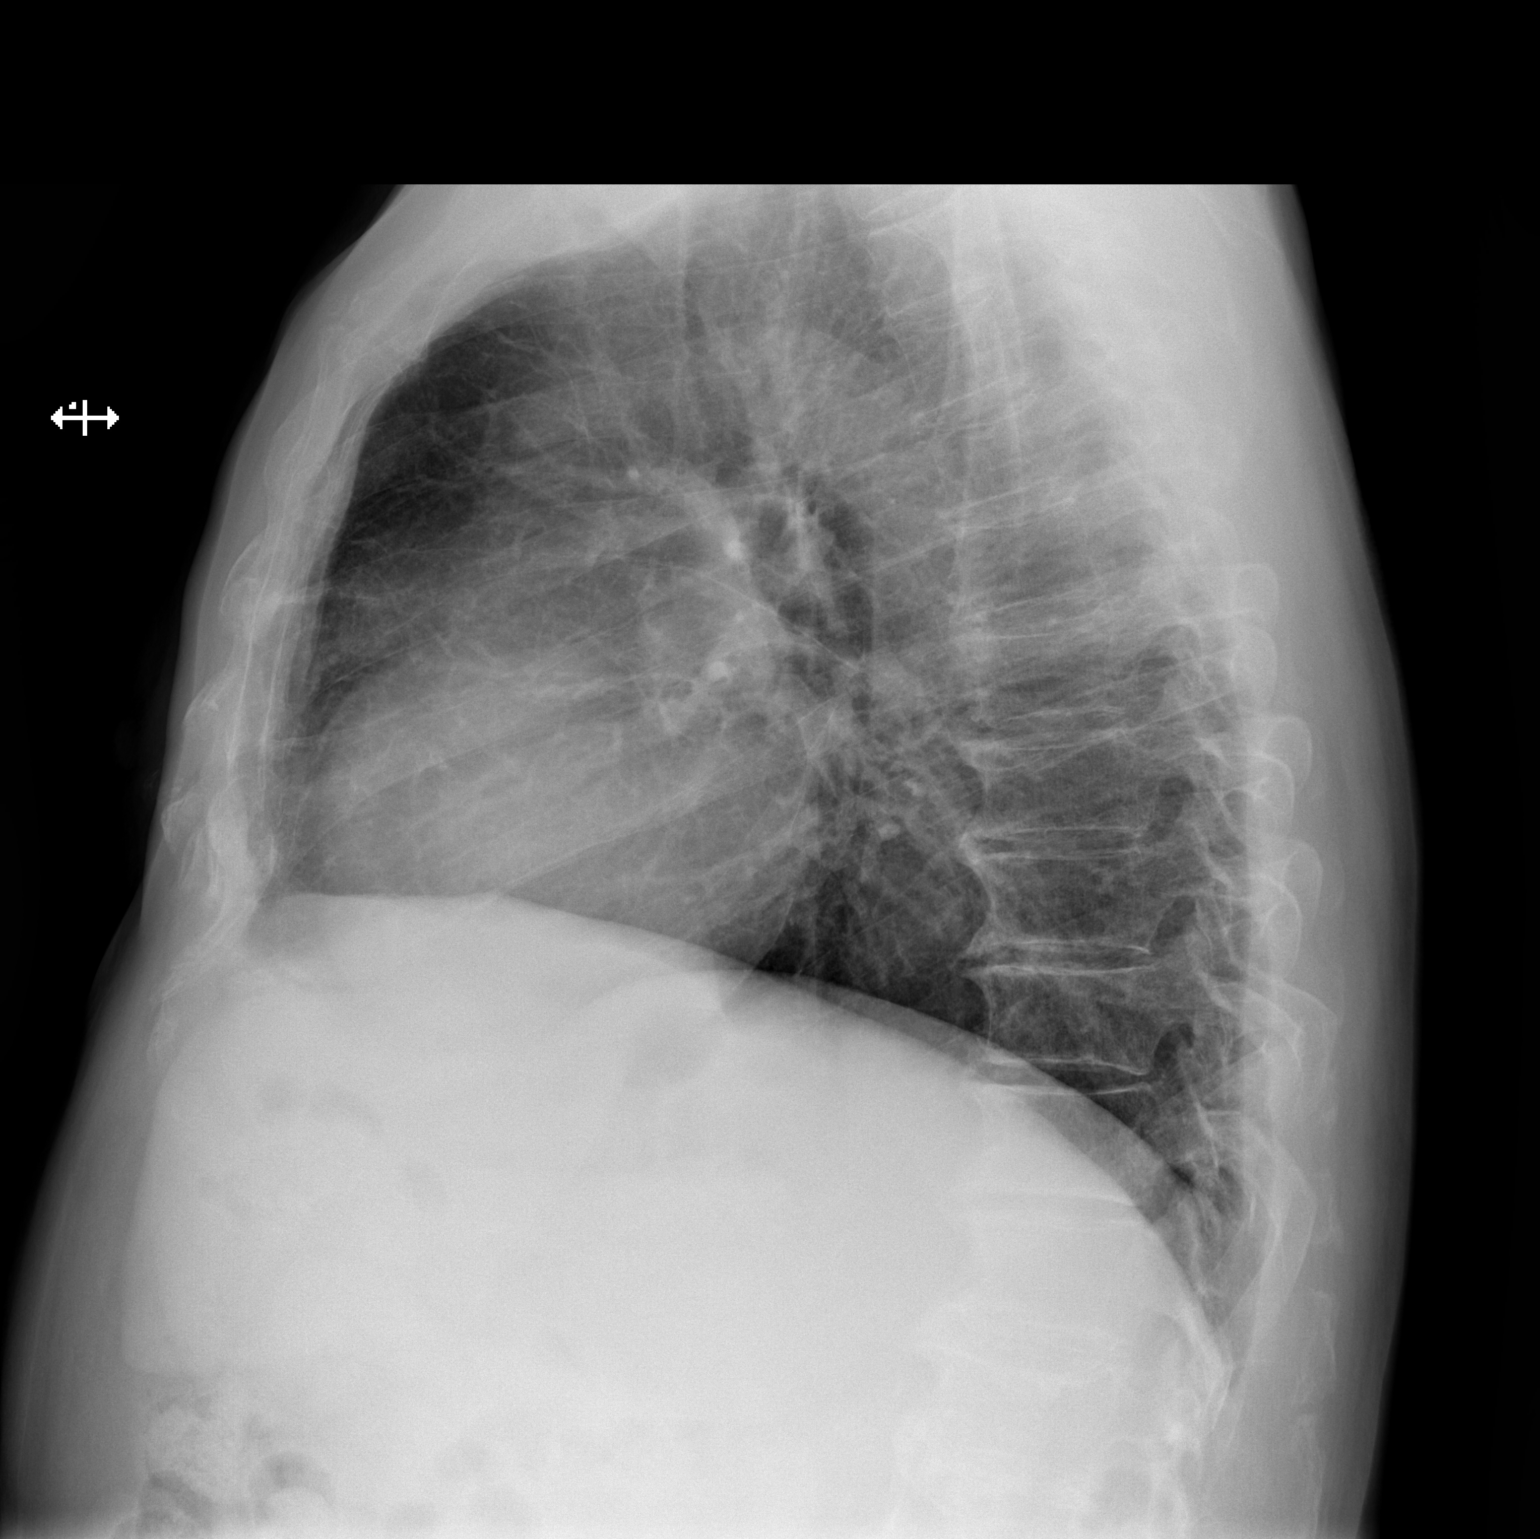

[2 of 2 positions shown; findings below may reference images not displayed]

FINDINGS: Mild interstitial coarsening but no hyperinflation or emphysematous
change. There is no edema, consolidation, effusion, or pneumothorax.
Normal heart size and negative mediastinal contours. Thoracic
spondylosis.
IMPRESSION: No active cardiopulmonary disease.

## 2018-02-06 IMAGING — CR DG KNEE 1-2V PORT*L*
2 series · 2 of 2 positions shown · non-contrast
Comparison: None.

CLINICAL DATA: Status post total knee arthroplasty.

EXAM:
PORTABLE LEFT KNEE - 1-2 VIEW

[AP]
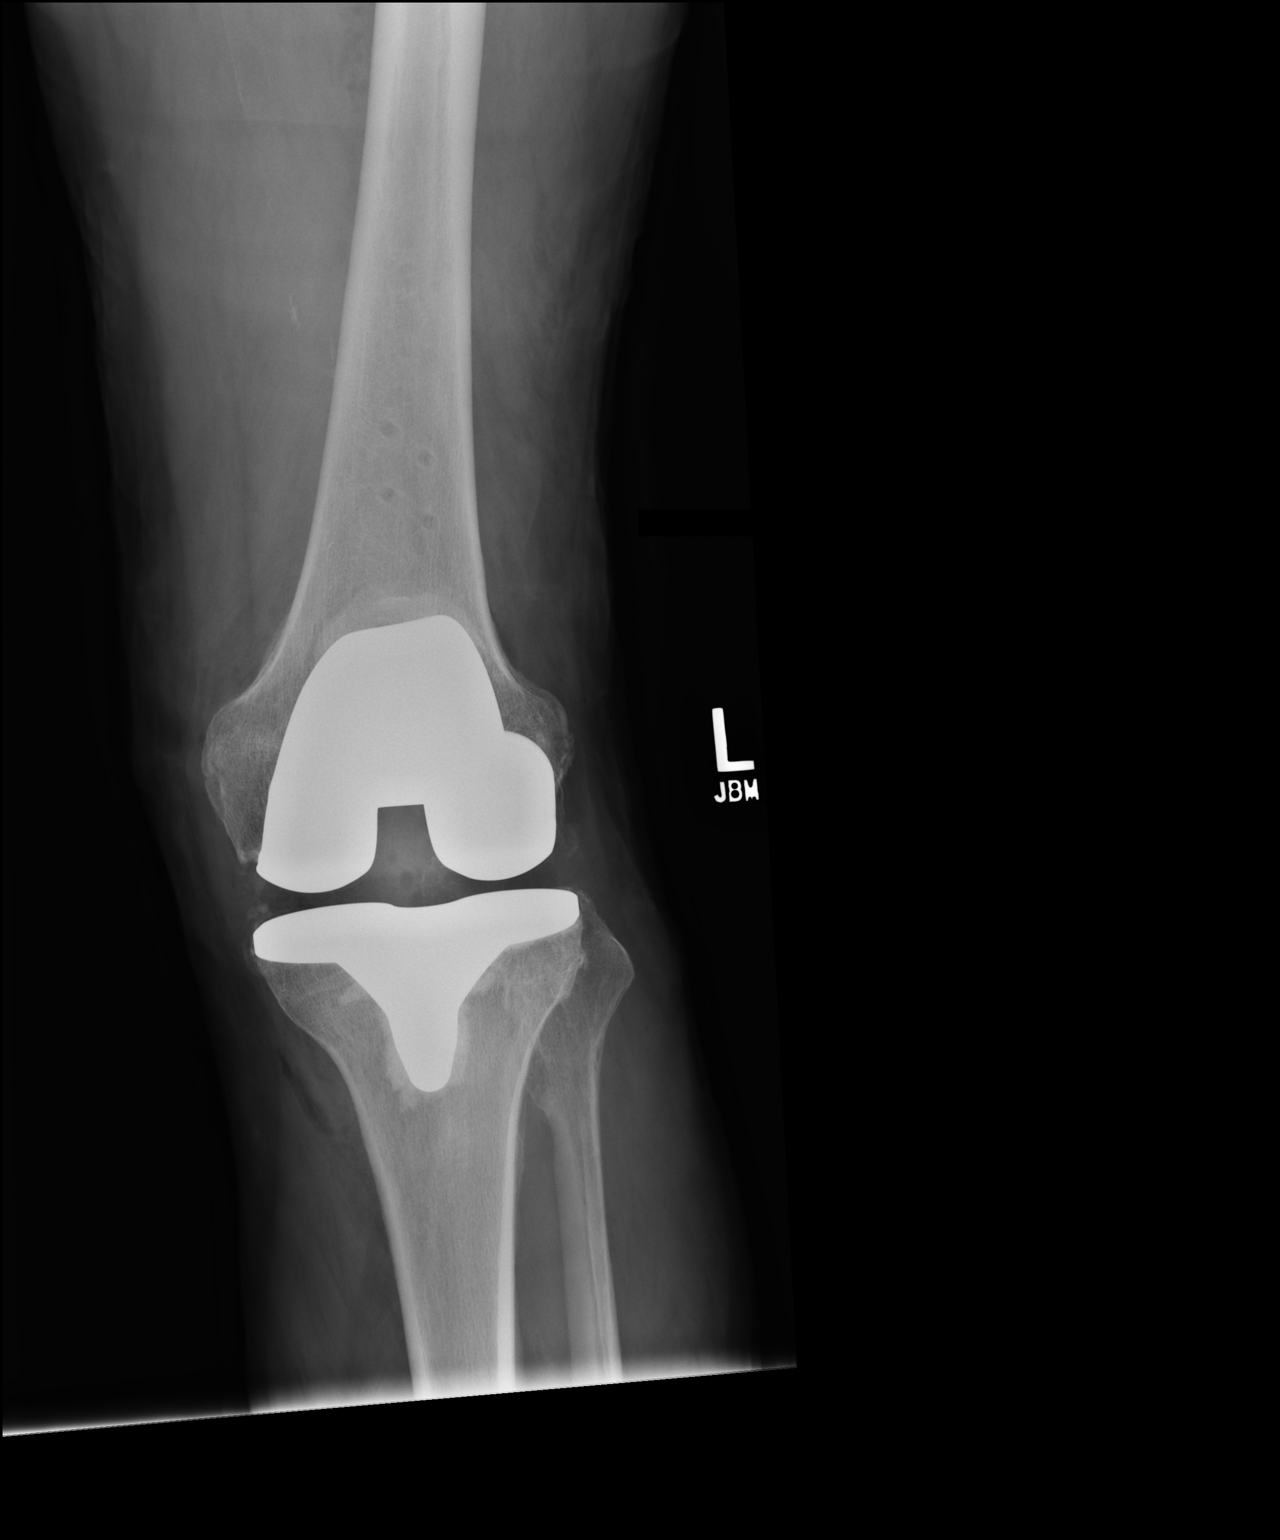

[xtable lateral]
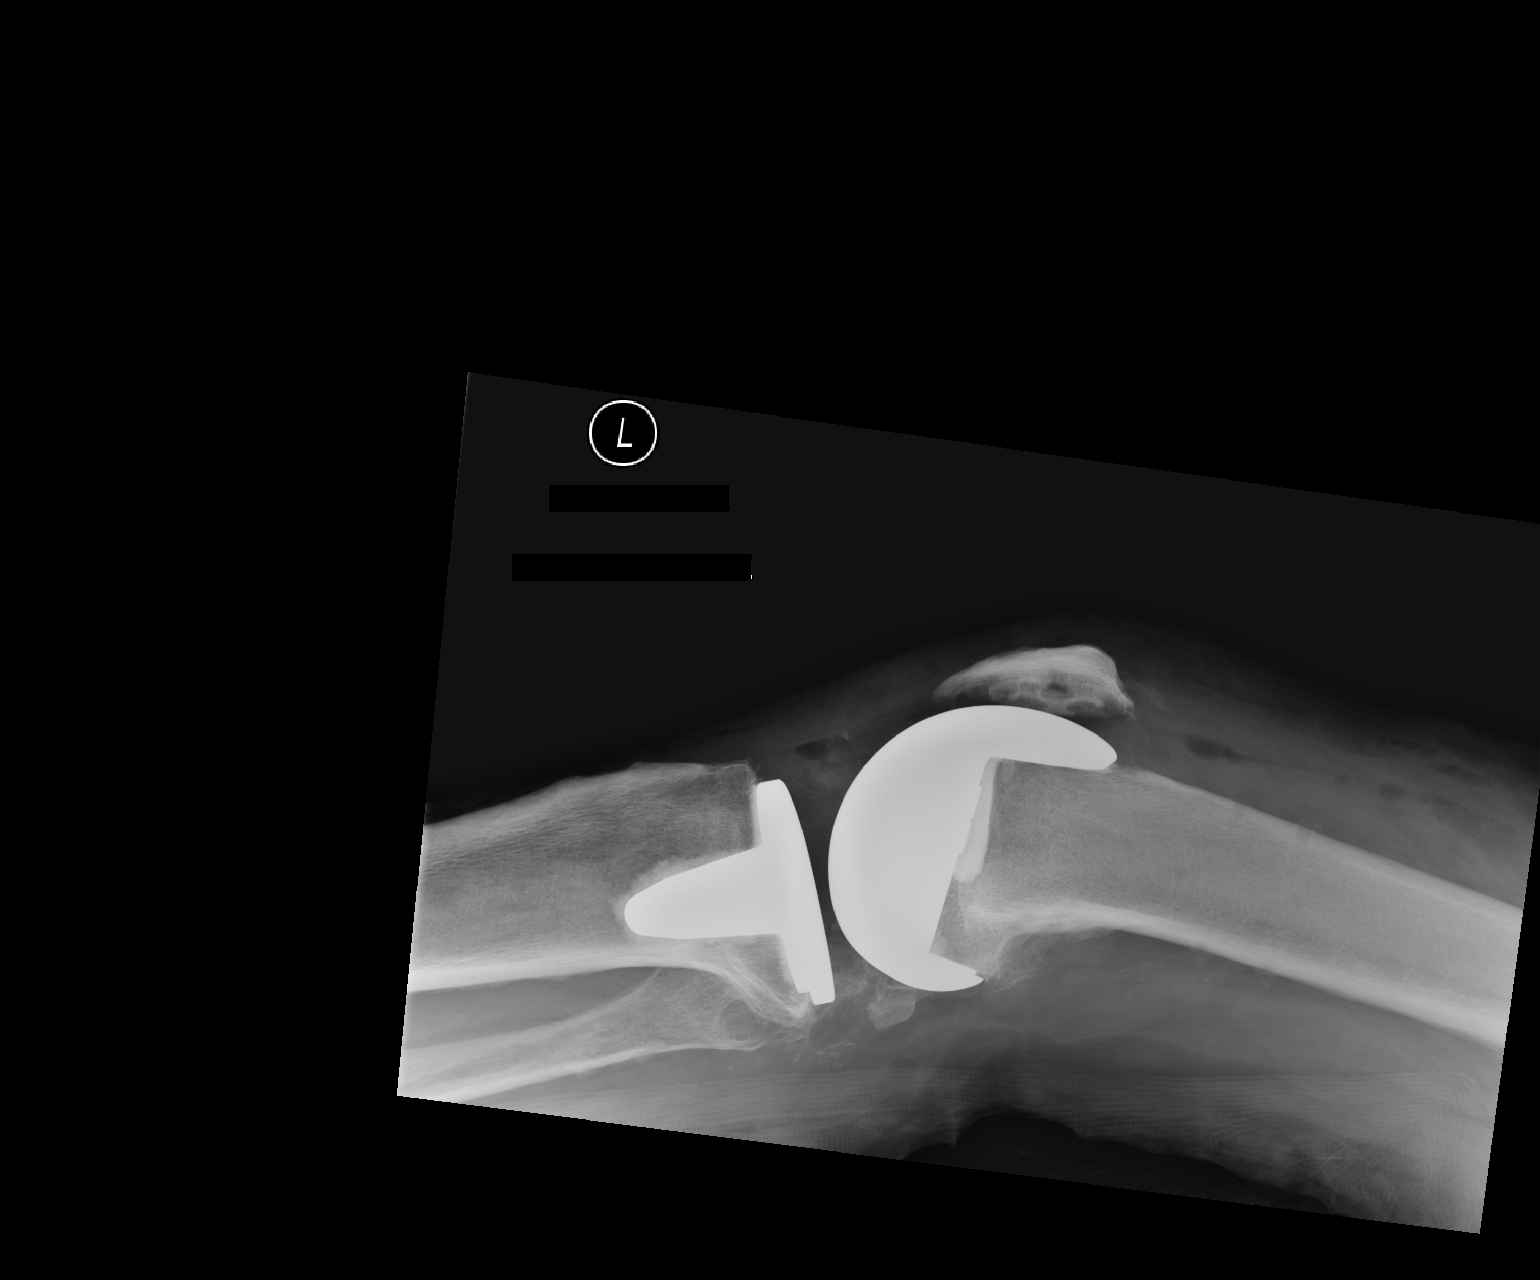

[2 of 2 positions shown; findings below may reference images not displayed]

FINDINGS: The femoral and tibial components are well seated without
complicating features. Expected postoperative changes involving the
patella.
IMPRESSION: Well seated components of a total left knee arthroplasty. No
complicating features.

## 2018-02-13 ENCOUNTER — Ambulatory Visit (INDEPENDENT_AMBULATORY_CARE_PROVIDER_SITE_OTHER): Payer: Medicare Other | Admitting: Internal Medicine

## 2018-02-13 ENCOUNTER — Encounter: Payer: Self-pay | Admitting: Internal Medicine

## 2018-02-13 VITALS — BP 110/60 | HR 73 | Temp 98.3°F | Wt 216.4 lb

## 2018-02-13 DIAGNOSIS — J029 Acute pharyngitis, unspecified: Secondary | ICD-10-CM | POA: Diagnosis not present

## 2018-02-13 DIAGNOSIS — J020 Streptococcal pharyngitis: Secondary | ICD-10-CM | POA: Diagnosis not present

## 2018-02-13 LAB — POCT RAPID STREP A (OFFICE): RAPID STREP A SCREEN: POSITIVE — AB

## 2018-02-13 MED ORDER — PENICILLIN V POTASSIUM 500 MG PO TABS: 500.0000 mg | ORAL_TABLET | Freq: Three times a day (TID) | ORAL | 0 refills | Status: AC

## 2018-02-13 NOTE — Progress Notes (Signed)
Established Patient Office Visit     CC/Reason for Visit: Sore throat  HPI: Tyler Greer is a 82 y.o. male who is coming in today for the above mentioned reasons.  He has been having a sore, scratchy throat for the past 3 days.  He has not had fevers or chills.  Yesterday he noticed that he is voice was becoming hoarse.  He has also had some nasal and sinus congestion and runny nose.  No recent travel or sick contacts.  His granddaughter urged him to make this appointment today.    Past Medical/Surgical History: Past Medical History:  Diagnosis Date  . Arthritis    "hands and knees" (06/10/2015)  . Basal cell carcinoma, arm    "both"  . Basal cell carcinoma, face   . Diabetes mellitus type II 10/2007  . Intention tremor   . Migraine    "none in years" (06/10/2015)  . Osteoarthritis   . Paresthesia of foot    bilateral  . Stroke ([place]) 2005   mini stroke    Past Surgical History:  Procedure Laterality Date  . BACK SURGERY    . HAMMER TOE SURGERY Bilateral   . JOINT REPLACEMENT    . KNEE ARTHROPLASTY Left 06/10/2015   Procedure: COMPUTER ASSISTED TOTAL KNEE ARTHROPLASTY;  Surgeon: Marybelle Killings, MD;  Location: [place];  Service: Orthopedics;  Laterality: Left;  . KNEE ARTHROSCOPY Bilateral 2002  . LUMBAR LAMINECTOMY  X 2  . TONSILLECTOMY    . TOTAL KNEE ARTHROPLASTY Left 06/10/2015    Social History:  reports that he has never smoked. He has never used smokeless tobacco. He reports that he does not drink alcohol or use drugs.  Allergies: No Known Allergies  Family History:  Family History  Problem Relation Age of Onset  . Heart disease Father        died age 55-MI  . Diabetes type II Brother   . Melanoma Brother      Current Outpatient Medications:  .  metFORMIN (GLUCOPHAGE-XR) 500 MG 24 hr tablet, Take 1 tablet (500 mg total) by mouth daily with breakfast., Disp: 180 tablet, Rfl: 4 .  naproxen sodium (ALEVE) 220 MG tablet, Take 440 mg by mouth daily.,  Disp: , Rfl:  .  Triamcinolone Acetonide (TRIAMCINOLONE 0.1 % CREAM : EUCERIN) CREA, Apply 1 application topically 2 (two) times daily., Disp: 1 each, Rfl: 3 .  penicillin v potassium (VEETID) 500 MG tablet, Take 1 tablet (500 mg total) by mouth 3 (three) times daily for 10 days., Disp: 30 tablet, Rfl: 0  Review of Systems: Alert oriented Constitutional: Denies fever, chills, diaphoresis, appetite change and fatigue.  HEENT: Denies photophobia, eye pain, redness, hearing loss, ear pain, congestion,  rhinorrhea, sneezing, mouth sores, neck pain, neck stiffness and tinnitus.   Respiratory: Denies SOB, DOE, cough, chest tightness,  and wheezing.   Cardiovascular: Denies chest pain, palpitations and leg swelling.  Gastrointestinal: Denies nausea, vomiting, abdominal pain, diarrhea, constipation, blood in stool and abdominal distention.  Genitourinary: Denies dysuria, urgency, frequency, hematuria, flank pain and difficulty urinating.  Endocrine: Denies: hot or cold intolerance, sweats, changes in hair or nails, polyuria, polydipsia. Musculoskeletal: Denies myalgias, back pain, joint swelling, arthralgias and gait problem.  Skin: Denies pallor, rash and wound.  Neurological: Denies dizziness, seizures, syncope, weakness, light-headedness, numbness and headaches.  Hematological: Denies adenopathy. Easy bruising, personal or family bleeding history  Psychiatric/Behavioral: Denies suicidal ideation, mood changes, confusion, nervousness, sleep disturbance and agitation  Physical Exam: Vitals:   02/13/18 1152  BP: 110/60  Pulse: 73  Temp: 98.3 F (36.8 C)  TempSrc: Oral  SpO2: 97%  Weight: 216 lb 6.4 oz (98.2 kg)    Body mass index is 30.18 kg/m.  Constitutional: NAD, calm, comfortable Eyes: PERRL, lids and conjunctivae normal ENMT: Mucous membranes are moist. Positive pharyngeal erythema with an exudate over left tonsil. Tympanic membrane is pearly white, no erythema or bulging. Neck:  normal, supple, no masses, no thyromegaly Respiratory: clear to auscultation bilaterally, no wheezing, no crackles. Normal respiratory effort. No accessory muscle use.  Cardiovascular: Regular rate and rhythm, no murmurs / rubs / gallops. No extremity edema. 2+ pedal pulses. No carotid bruits.  Neurologic: Grossly intact and nonfocal Psychiatric: Normal judgment and insight. Alert and oriented x 3. Normal mood.    Impression and Plan:  Strep throat  Sore throat  -With significant pharyngeal erythema and left tonsillar exudate. -Rapid strep test has resulted positive. -We will treat with penicillin V 500 mg 3 times a day for 10 days. -He is urged to return to clinic if symptoms fail to improve within that timeframe.    Patient Instructions  -It was nice seeing you today!  -Take Penicillin V 500 mg three times a day for 10 days for treatment of your strep throat.  -Follow up with your PCP as scheduled.   Strep Throat  Strep throat is an infection of the throat. It is caused by germs. Strep throat spreads from person to person because of coughing, sneezing, or close contact. Follow these instructions at home: Medicines  Take over-the-counter and prescription medicines only as told by your doctor.  Take your antibiotic medicine as told by your doctor. Do not stop taking the medicine even if you feel better.  Have family members who also have a sore throat or fever go to a doctor. Eating and drinking  Do not share food, drinking cups, or personal items.  Try eating soft foods until your sore throat feels better.  Drink enough fluid to keep your pee (urine) clear or pale yellow. General instructions  Rinse your mouth (gargle) with a salt-water mixture 3-4 times per day or as needed. To make a salt-water mixture, stir -1 tsp of salt into 1 cup of warm water.  Make sure that all people in your house wash their hands well.  Rest.  Stay home from school or work until you  have been taking antibiotics for 24 hours.  Keep all follow-up visits as told by your doctor. This is important. Contact a doctor if:  Your neck keeps getting bigger.  You get a rash, cough, or earache.  You cough up thick liquid that is green, yellow-brown, or bloody.  You have pain that does not get better with medicine.  Your problems get worse instead of getting better.  You have a fever. Get help right away if:  You throw up (vomit).  You get a very bad headache.  You neck hurts or it feels stiff.  You have chest pain or you are short of breath.  You have drooling, very bad throat pain, or changes in your voice.  Your neck is swollen or the skin gets red and tender.  Your mouth is dry or you are peeing less than normal.  You keep feeling more tired or it is hard to wake up.  Your joints are red or they hurt. This information is not intended to replace advice given to you by your  health care provider. Make sure you discuss any questions you have with your health care provider. Document Released: 07/27/2007 Document Revised: 10/07/2015 Document Reviewed: 06/02/2014 Elsevier Interactive Patient Education  2019 [place], MD [facility]

## 2018-02-13 NOTE — Patient Instructions (Signed)
-  It was nice seeing you today!  -Take Penicillin V 500 mg three times a day for 10 days for treatment of your strep throat.  -Follow up with your PCP as scheduled.   Strep Throat  Strep throat is an infection of the throat. It is caused by germs. Strep throat spreads from person to person because of coughing, sneezing, or close contact. Follow these instructions at home: Medicines  Take over-the-counter and prescription medicines only as told by your doctor.  Take your antibiotic medicine as told by your doctor. Do not stop taking the medicine even if you feel better.  Have family members who also have a sore throat or fever go to a doctor. Eating and drinking  Do not share food, drinking cups, or personal items.  Try eating soft foods until your sore throat feels better.  Drink enough fluid to keep your pee (urine) clear or pale yellow. General instructions  Rinse your mouth (gargle) with a salt-water mixture 3-4 times per day or as needed. To make a salt-water mixture, stir -1 tsp of salt into 1 cup of warm water.  Make sure that all people in your house wash their hands well.  Rest.  Stay home from school or work until you have been taking antibiotics for 24 hours.  Keep all follow-up visits as told by your doctor. This is important. Contact a doctor if:  Your neck keeps getting bigger.  You get a rash, cough, or earache.  You cough up thick liquid that is green, yellow-brown, or bloody.  You have pain that does not get better with medicine.  Your problems get worse instead of getting better.  You have a fever. Get help right away if:  You throw up (vomit).  You get a very bad headache.  You neck hurts or it feels stiff.  You have chest pain or you are short of breath.  You have drooling, very bad throat pain, or changes in your voice.  Your neck is swollen or the skin gets red and tender.  Your mouth is dry or you are peeing less than normal.  You  keep feeling more tired or it is hard to wake up.  Your joints are red or they hurt. This information is not intended to replace advice given to you by your health care provider. Make sure you discuss any questions you have with your health care provider. Document Released: 07/27/2007 Document Revised: 10/07/2015 Document Reviewed: 06/02/2014 Elsevier Interactive Patient Education  [employer].

## 2018-03-30 ENCOUNTER — Ambulatory Visit (INDEPENDENT_AMBULATORY_CARE_PROVIDER_SITE_OTHER): Payer: Medicare Other | Admitting: Adult Health

## 2018-03-30 ENCOUNTER — Encounter: Payer: Self-pay | Admitting: Adult Health

## 2018-03-30 VITALS — BP 100/52 | Temp 97.9°F | Wt 211.0 lb

## 2018-03-30 DIAGNOSIS — E0840 Diabetes mellitus due to underlying condition with diabetic neuropathy, unspecified: Secondary | ICD-10-CM

## 2018-03-30 DIAGNOSIS — J302 Other seasonal allergic rhinitis: Secondary | ICD-10-CM

## 2018-03-30 LAB — POCT GLYCOSYLATED HEMOGLOBIN (HGB A1C): HbA1c, POC (controlled diabetic range): 5.9 % (ref 0.0–7.0)

## 2018-03-30 MED ORDER — FLUTICASONE PROPIONATE 50 MCG/ACT NA SUSP: 2.0000 | Freq: Every day | NASAL | 6 refills | Status: DC

## 2018-03-30 NOTE — Progress Notes (Signed)
Subjective:    Patient ID: Tyler Greer, male    DOB: 07/09/1935, 83 y.o.   MRN: [id number]  HPI  83 year old male who  has a past medical history of Arthritis, Basal cell carcinoma, arm, Basal cell carcinoma, face, Diabetes mellitus type II (10/2007), Intention tremor, Migraine, Osteoarthritis, Paresthesia of foot, and Stroke ([place]) (2005). Presents to the office today for 39-month follow-up regarding diabetes mellitus.  He is currently well controlled on metformin 500 mg extended release daily.  He does not check his blood sugars at home.  He denies any feelings of hypo-or hyperglycemia.  And has no gastrointestinal issues due to taking metformin.  Lab Results  Component Value Date   HGBA1C 5.9 (A) 07/25/2017   Additionally, he complains of one month of rhinorrhea, nasal congestion, watery eyes and left sided sinus pressure. He denies fevers, chills, or feeling acutely ill. He has not been using any OTC medications   Review of Systems See HPI   Past Medical History:  Diagnosis Date  . Arthritis    "hands and knees" (06/10/2015)  . Basal cell carcinoma, arm    "both"  . Basal cell carcinoma, face   . Diabetes mellitus type II 10/2007  . Intention tremor   . Migraine    "none in years" (06/10/2015)  . Osteoarthritis   . Paresthesia of foot    bilateral  . Stroke ([place]) 2005   mini stroke    Social History   Socioeconomic History  . Marital status: Widowed    Spouse name: Not on file  . Number of children: Not on file  . Years of education: Not on file  . Highest education level: Not on file  Occupational History  . Not on file  Social Needs  . Financial resource strain: Not on file  . Food insecurity:    Worry: Not on file    Inability: Not on file  . Transportation needs:    Medical: Not on file    Non-medical: Not on file  Tobacco Use  . Smoking status: Never Smoker  . Smokeless tobacco: Never Used  . Tobacco comment: "might have smoked 1 pack of cigarettes  in my life"  Substance and Sexual Activity  . Alcohol use: No  . Drug use: No  . Sexual activity: Not Currently  Lifestyle  . Physical activity:    Days per week: Not on file    Minutes per session: Not on file  . Stress: Not on file  Relationships  . Social connections:    Talks on phone: Not on file    Gets together: Not on file    Attends religious service: Not on file    Active member of club or organization: Not on file    Attends meetings of clubs or organizations: Not on file    Relationship status: Not on file  . Intimate partner violence:    Fear of current or ex partner: Not on file    Emotionally abused: Not on file    Physically abused: Not on file    Forced sexual activity: Not on file  Other Topics Concern  . Not on file  Social History Narrative   Retired Clinical biochemist     Past Surgical History:  Procedure Laterality Date  . BACK SURGERY    . HAMMER TOE SURGERY Bilateral   . JOINT REPLACEMENT    . KNEE ARTHROPLASTY Left 06/10/2015   Procedure: COMPUTER ASSISTED TOTAL KNEE ARTHROPLASTY;  Surgeon:  Marybelle Killings, MD;  Location: [place];  Service: Orthopedics;  Laterality: Left;  . KNEE ARTHROSCOPY Bilateral 2002  . LUMBAR LAMINECTOMY  X 2  . TONSILLECTOMY    . TOTAL KNEE ARTHROPLASTY Left 06/10/2015    Family History  Problem Relation Age of Onset  . Heart disease Father        died age 31-MI  . Diabetes type II Brother   . Melanoma Brother     No Known Allergies  Current Outpatient Medications on File Prior to Visit  Medication Sig Dispense Refill  . metFORMIN (GLUCOPHAGE-XR) 500 MG 24 hr tablet Take 1 tablet (500 mg total) by mouth daily with breakfast. 180 tablet 4  . naproxen sodium (ALEVE) 220 MG tablet Take 440 mg by mouth daily.    . Triamcinolone Acetonide (TRIAMCINOLONE 0.1 % CREAM : EUCERIN) CREA Apply 1 application topically 2 (two) times daily. 1 each 3   No current facility-administered medications on file prior to visit.     BP  (!) 100/52   Temp 97.9 F (36.6 C)   Wt 211 lb (95.7 kg)   BMI 29.43 kg/m       Objective:   Physical Exam Vitals signs and nursing note reviewed.  Constitutional:      Appearance: Normal appearance.  HENT:     Right Ear: Tympanic membrane, ear canal and external ear normal. There is no impacted cerumen.     Left Ear: Tympanic membrane, ear canal and external ear normal. There is no impacted cerumen.     Nose: Congestion and rhinorrhea present.     Right Turbinates: Enlarged.     Right Sinus: No maxillary sinus tenderness or frontal sinus tenderness.     Left Sinus: No maxillary sinus tenderness or frontal sinus tenderness.     Mouth/Throat:     Mouth: Mucous membranes are moist.     Pharynx: Oropharynx is clear. No pharyngeal swelling, oropharyngeal exudate or posterior oropharyngeal erythema.     Tonsils: No tonsillar exudate or tonsillar abscesses.  Eyes:     General:        Right eye: No discharge.        Left eye: No discharge.     Extraocular Movements: Extraocular movements intact.     Conjunctiva/sclera: Conjunctivae normal.     Pupils: Pupils are equal, round, and reactive to light.  Cardiovascular:     Rate and Rhythm: Normal rate and regular rhythm.     Pulses: Normal pulses.     Heart sounds: Normal heart sounds.  Pulmonary:     Effort: Pulmonary effort is normal.     Breath sounds: Normal breath sounds.  Neurological:     Mental Status: He is alert.       Assessment & Plan:  1. Diabetes mellitus due to underlying condition with diabetic neuropathy, without long-term current use of insulin (HCC)  - POCT A1C- 5.9  - Continue with current dose of metformin. Will repeat in 6 months at CPE   2. Seasonal allergies  - fluticasone (FLONASE) 50 MCG/ACT nasal spray; Place 2 sprays into both nostrils daily.  Dispense: 16 g; Refill: 6 - Follow up if no improvement in the next week or sooner if symptoms worsen   Dorothyann Peng

## 2018-05-22 ENCOUNTER — Telehealth: Payer: Self-pay

## 2018-05-22 NOTE — Telephone Encounter (Signed)
Author phoned pt. to assess interest in scheduling virtual awv. Pt. Stated he does not have a computer or smart phone, but is open to doing AWV later in the year. Appointment with health coach made for 8/19.

## 2018-08-06 DIAGNOSIS — L570 Actinic keratosis: Secondary | ICD-10-CM | POA: Diagnosis not present

## 2018-08-06 DIAGNOSIS — L821 Other seborrheic keratosis: Secondary | ICD-10-CM | POA: Diagnosis not present

## 2018-08-06 DIAGNOSIS — Z85828 Personal history of other malignant neoplasm of skin: Secondary | ICD-10-CM | POA: Diagnosis not present

## 2018-08-30 DIAGNOSIS — M5030 Other cervical disc degeneration, unspecified cervical region: Secondary | ICD-10-CM | POA: Insufficient documentation

## 2018-09-20 DIAGNOSIS — L02413 Cutaneous abscess of right upper limb: Secondary | ICD-10-CM | POA: Diagnosis not present

## 2018-09-20 DIAGNOSIS — Z85828 Personal history of other malignant neoplasm of skin: Secondary | ICD-10-CM | POA: Diagnosis not present

## 2018-09-20 DIAGNOSIS — L0390 Cellulitis, unspecified: Secondary | ICD-10-CM | POA: Diagnosis not present

## 2018-10-04 ENCOUNTER — Ambulatory Visit: Payer: Medicare Other

## 2018-10-10 ENCOUNTER — Ambulatory Visit (INDEPENDENT_AMBULATORY_CARE_PROVIDER_SITE_OTHER): Payer: Medicare Other | Admitting: Adult Health

## 2018-10-10 ENCOUNTER — Encounter: Payer: Self-pay | Admitting: Adult Health

## 2018-10-10 VITALS — BP 124/80 | Temp 98.6°F | Wt 212.0 lb

## 2018-10-10 DIAGNOSIS — E0840 Diabetes mellitus due to underlying condition with diabetic neuropathy, unspecified: Secondary | ICD-10-CM

## 2018-10-10 DIAGNOSIS — R413 Other amnesia: Secondary | ICD-10-CM

## 2018-10-10 NOTE — Progress Notes (Signed)
Subjective:    Patient ID: Tyler Greer, male    DOB: [birth year], 83 y.o.   MRN: [id number]  HPI  Patient presents to the office today for six month follow up regarding DM. He is controlled with Metformin 500 mg ER daily. He does not check his blood sugars at home. He denies symptoms of hypoglycemia   He has talked to his pharmacist after seeing that Metformin ER may have cancer causing agents in it, his pharmacist told him that his lot was not recalled and he has continued this medication.   His pharmacist also told him that he may want to be on a B12 supplement so he would like to have his B12 levels checked. He does reports some age related memory loss where he can not think of the answer to various questions right away. He denies getting lost while driving " are you kidding me, people call me all the time to ask for directions." He also denied feeling as though he misplaces his keys or wallet. He is not forgetting names of people he knows.   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   Respiratory: Negative.   Cardiovascular: Negative.   Gastrointestinal: Negative.   Genitourinary: Negative.   Musculoskeletal: Negative.   Skin: Negative.   Neurological: Positive for numbness.  Psychiatric/Behavioral: Positive for confusion.  All other systems reviewed and are negative.  Past Medical History:  Diagnosis Date  . Arthritis    "hands and knees" (06/10/2015)  . Basal cell carcinoma, arm    "both"  . Basal cell carcinoma, face   . Diabetes mellitus type II 10/2007  . Intention tremor   . Migraine    "none in years" (06/10/2015)  . Osteoarthritis   . Paresthesia of foot    bilateral  . Stroke ([place]) 2005   mini stroke    Social History   Socioeconomic History  . Marital status: Widowed    Spouse name: Not on file  . Number of children: Not on file   . Years of education: Not on file  . Highest education level: Not on file  Occupational History  . Not on file  Social Needs  . Financial resource strain: Not on file  . Food insecurity    Worry: Not on file    Inability: Not on file  . Transportation needs    Medical: Not on file    Non-medical: Not on file  Tobacco Use  . Smoking status: Never Smoker  . Smokeless tobacco: Never Used  . Tobacco comment: "might have smoked 1 pack of cigarettes in my life"  Substance and Sexual Activity  . Alcohol use: No  . Drug use: No  . Sexual activity: Not Currently  Lifestyle  . Physical activity    Days per week: Not on file    Minutes per session: Not on file  . Stress: Not on file  Relationships  . Social Herbalist on phone: Not on file    Gets together: Not on file    Attends religious service: Not on file    Active member of club or organization: Not on file    Attends meetings of clubs or organizations: Not on file    Relationship status: Not on file  . Intimate partner violence    Fear of current or ex partner: Not  on file    Emotionally abused: Not on file    Physically abused: Not on file    Forced sexual activity: Not on file  Other Topics Concern  . Not on file  Social History Narrative   Retired Clinical biochemist     Past Surgical History:  Procedure Laterality Date  . BACK SURGERY    . HAMMER TOE SURGERY Bilateral   . JOINT REPLACEMENT    . KNEE ARTHROPLASTY Left 06/10/2015   Procedure: COMPUTER ASSISTED TOTAL KNEE ARTHROPLASTY;  Surgeon: Marybelle Killings, MD;  Location: [facility];  Service: Orthopedics;  Laterality: Left;  . KNEE ARTHROSCOPY Bilateral 2002  . LUMBAR LAMINECTOMY  X 2  . TONSILLECTOMY    . TOTAL KNEE ARTHROPLASTY Left 06/10/2015    Family History  Problem Relation Age of Onset  . Heart disease Father        died age 61-MI  . Diabetes type II Brother   . Melanoma Brother     No Known Allergies  Current Outpatient Medications on  File Prior to Visit  Medication Sig Dispense Refill  . metFORMIN (GLUCOPHAGE-XR) 500 MG 24 hr tablet Take 1 tablet (500 mg total) by mouth daily with breakfast. 180 tablet 4  . Multiple Vitamins-Minerals (ZINC PO) Take by mouth.    . naproxen sodium (ALEVE) 220 MG tablet Take 440 mg by mouth daily.    . Triamcinolone Acetonide (TRIAMCINOLONE 0.1 % CREAM : EUCERIN) CREA Apply 1 application topically 2 (two) times daily. 1 each 3  . fluticasone (FLONASE) 50 MCG/ACT nasal spray Place 2 sprays into both nostrils daily. 16 g 6   No current facility-administered medications on file prior to visit.     BP 124/80   Temp 98.6 F (37 C)   Wt 212 lb (96.2 kg)   BMI 29.57 kg/m       Objective:   Physical Exam Vitals signs and nursing note reviewed.  Constitutional:      General: He is not in acute distress.    Appearance: Normal appearance. He is normal weight. He is not diaphoretic.  Neck:     Musculoskeletal: Normal range of motion and neck supple.     Thyroid: No thyromegaly.     Vascular: No JVD.     Trachea: No tracheal deviation.  Cardiovascular:     Rate and Rhythm: Normal rate and regular rhythm.     Pulses: Normal pulses.     Heart sounds: Normal heart sounds. No murmur. No friction rub. No gallop.   Pulmonary:     Effort: Pulmonary effort is normal. No respiratory distress.     Breath sounds: Normal breath sounds. No stridor. No wheezing, rhonchi or rales.  Chest:     Chest wall: No tenderness.  Musculoskeletal: Normal range of motion.        General: No swelling, tenderness, deformity or signs of injury.     Right lower leg: No edema.     Left lower leg: No edema.  Lymphadenopathy:     Cervical: No cervical adenopathy.  Skin:    General: Skin is warm and dry.     Capillary Refill: Capillary refill takes less than 2 seconds.     Coloration: Skin is not jaundiced or pale.     Findings: No bruising, erythema, lesion or rash.  Neurological:     General: No focal deficit  present.     Mental Status: He is alert and oriented to person, place, and time.  Cranial Nerves: No cranial nerve deficit.     Sensory: No sensory deficit.     Motor: No weakness or abnormal muscle tone.     Coordination: Coordination normal.     Gait: Gait normal.     Deep Tendon Reflexes: Reflexes are normal and symmetric. Reflexes normal.  Psychiatric:        Attention and Perception: Attention and perception normal.        Mood and Affect: Mood normal.        Behavior: Behavior normal.        Thought Content: Thought content normal.        Cognition and Memory: Cognition and memory normal.        Judgment: Judgment normal.       Assessment & Plan:  1. Diabetes mellitus due to underlying condition with diabetic neuropathy, without long-term current use of insulin (HCC) - - Check A1c today. His BS have been well controlled in the past. No change likely  - CBC with Differential/Platelet - Hemoglobin A1c - TSH - Basic Metabolic Panel - Vitamin F68  2. Memory deficit - likely more age related. Will see if he has a vitamin B 12 deficiency  - CBC with Differential/Platelet - TSH - Basic Metabolic Panel - Vitamin L27   Dorothyann Peng, NP

## 2018-10-11 LAB — CBC WITH DIFFERENTIAL/PLATELET
Basophils Absolute: 0.1 10*3/uL (ref 0.0–0.1)
Basophils Relative: 1.4 % (ref 0.0–3.0)
Eosinophils Absolute: 0.1 10*3/uL (ref 0.0–0.7)
Eosinophils Relative: 1.6 % (ref 0.0–5.0)
HCT: 40.7 % (ref 39.0–52.0)
Hemoglobin: 13.5 g/dL (ref 13.0–17.0)
Lymphocytes Relative: 23.7 % (ref 12.0–46.0)
Lymphs Abs: 1.2 10*3/uL (ref 0.7–4.0)
MCHC: 33.1 g/dL (ref 30.0–36.0)
MCV: 90.5 fl (ref 78.0–100.0)
Monocytes Absolute: 0.5 10*3/uL (ref 0.1–1.0)
Monocytes Relative: 8.9 % (ref 3.0–12.0)
Neutro Abs: 3.4 10*3/uL (ref 1.4–7.7)
Neutrophils Relative %: 64.4 % (ref 43.0–77.0)
Platelets: 213 10*3/uL (ref 150.0–400.0)
RBC: 4.5 Mil/uL (ref 4.22–5.81)
RDW: 15.1 % (ref 11.5–15.5)
WBC: 5.3 10*3/uL (ref 4.0–10.5)

## 2018-10-11 LAB — BASIC METABOLIC PANEL
BUN: 19 mg/dL (ref 6–23)
CO2: 24 mEq/L (ref 19–32)
Calcium: 9.1 mg/dL (ref 8.4–10.5)
Chloride: 106 mEq/L (ref 96–112)
Creatinine, Ser: 1.19 mg/dL (ref 0.40–1.50)
GFR: 58.71 mL/min — ABNORMAL LOW (ref >60.00)
Glucose, Bld: 107 mg/dL — ABNORMAL HIGH (ref 70–99)
Potassium: 4 mEq/L (ref 3.5–5.1)
Sodium: 140 mEq/L (ref 135–145)

## 2018-10-11 LAB — TSH: TSH: 2.49 u[IU]/mL (ref 0.35–4.50)

## 2018-10-11 LAB — HEMOGLOBIN A1C: Hgb A1c MFr Bld: 6.5 % (ref 4.6–6.5)

## 2018-10-11 LAB — VITAMIN B12: Vitamin B-12: 256 pg/mL (ref 211–911)

## 2018-11-08 DIAGNOSIS — R0781 Pleurodynia: Secondary | ICD-10-CM | POA: Diagnosis not present

## 2018-12-29 ENCOUNTER — Ambulatory Visit (INDEPENDENT_AMBULATORY_CARE_PROVIDER_SITE_OTHER): Payer: Medicare Other

## 2018-12-29 ENCOUNTER — Other Ambulatory Visit: Payer: Self-pay

## 2018-12-29 DIAGNOSIS — Z23 Encounter for immunization: Secondary | ICD-10-CM | POA: Diagnosis not present

## 2019-01-14 ENCOUNTER — Other Ambulatory Visit: Payer: Self-pay | Admitting: Family Medicine

## 2019-01-14 DIAGNOSIS — E785 Hyperlipidemia, unspecified: Secondary | ICD-10-CM

## 2019-01-15 ENCOUNTER — Other Ambulatory Visit: Payer: Self-pay

## 2019-01-15 MED ORDER — METFORMIN HCL ER 500 MG PO TB24: 500.0000 mg | ORAL_TABLET | Freq: Every day | ORAL | 0 refills | Status: DC

## 2019-01-15 NOTE — Telephone Encounter (Signed)
Every 6 months

## 2019-01-15 NOTE — Telephone Encounter (Signed)
Sent to the pharmacy by e-scribe for 90 days. 

## 2019-01-16 DIAGNOSIS — M519 Unspecified thoracic, thoracolumbar and lumbosacral intervertebral disc disorder: Secondary | ICD-10-CM | POA: Diagnosis not present

## 2019-01-16 DIAGNOSIS — E119 Type 2 diabetes mellitus without complications: Secondary | ICD-10-CM | POA: Diagnosis not present

## 2019-01-16 DIAGNOSIS — M25511 Pain in right shoulder: Secondary | ICD-10-CM | POA: Diagnosis not present

## 2019-02-26 DIAGNOSIS — M25521 Pain in right elbow: Secondary | ICD-10-CM | POA: Insufficient documentation

## 2019-03-05 DIAGNOSIS — M25531 Pain in right wrist: Secondary | ICD-10-CM | POA: Insufficient documentation

## 2019-03-07 DIAGNOSIS — M25521 Pain in right elbow: Secondary | ICD-10-CM | POA: Diagnosis not present

## 2019-05-09 IMAGING — DX DG HIP (WITH OR WITHOUT PELVIS) 2-3V*R*
3 series · 3 of 3 positions shown · non-contrast
Comparison: None.

CLINICAL DATA: Fall.  Right hip pain.

EXAM:
DG HIP (WITH OR WITHOUT PELVIS) 2-3V RIGHT

[pelvis ap]
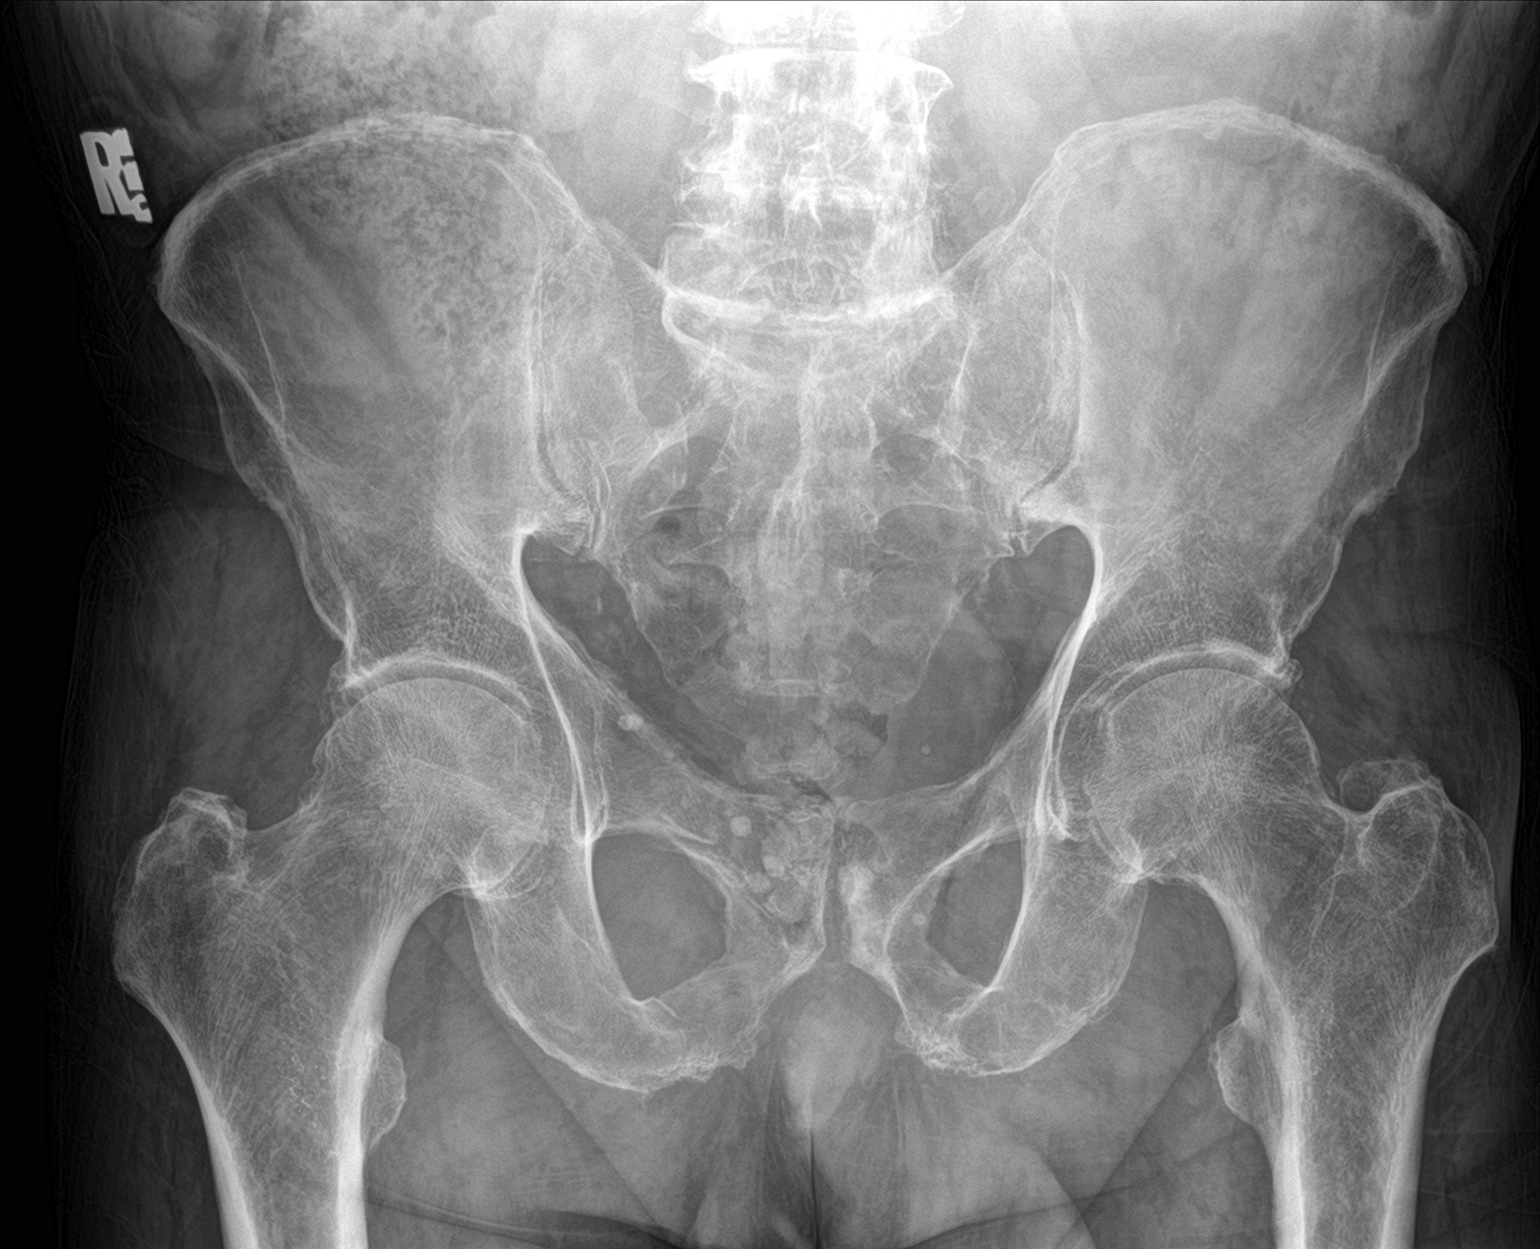

[hip ap]
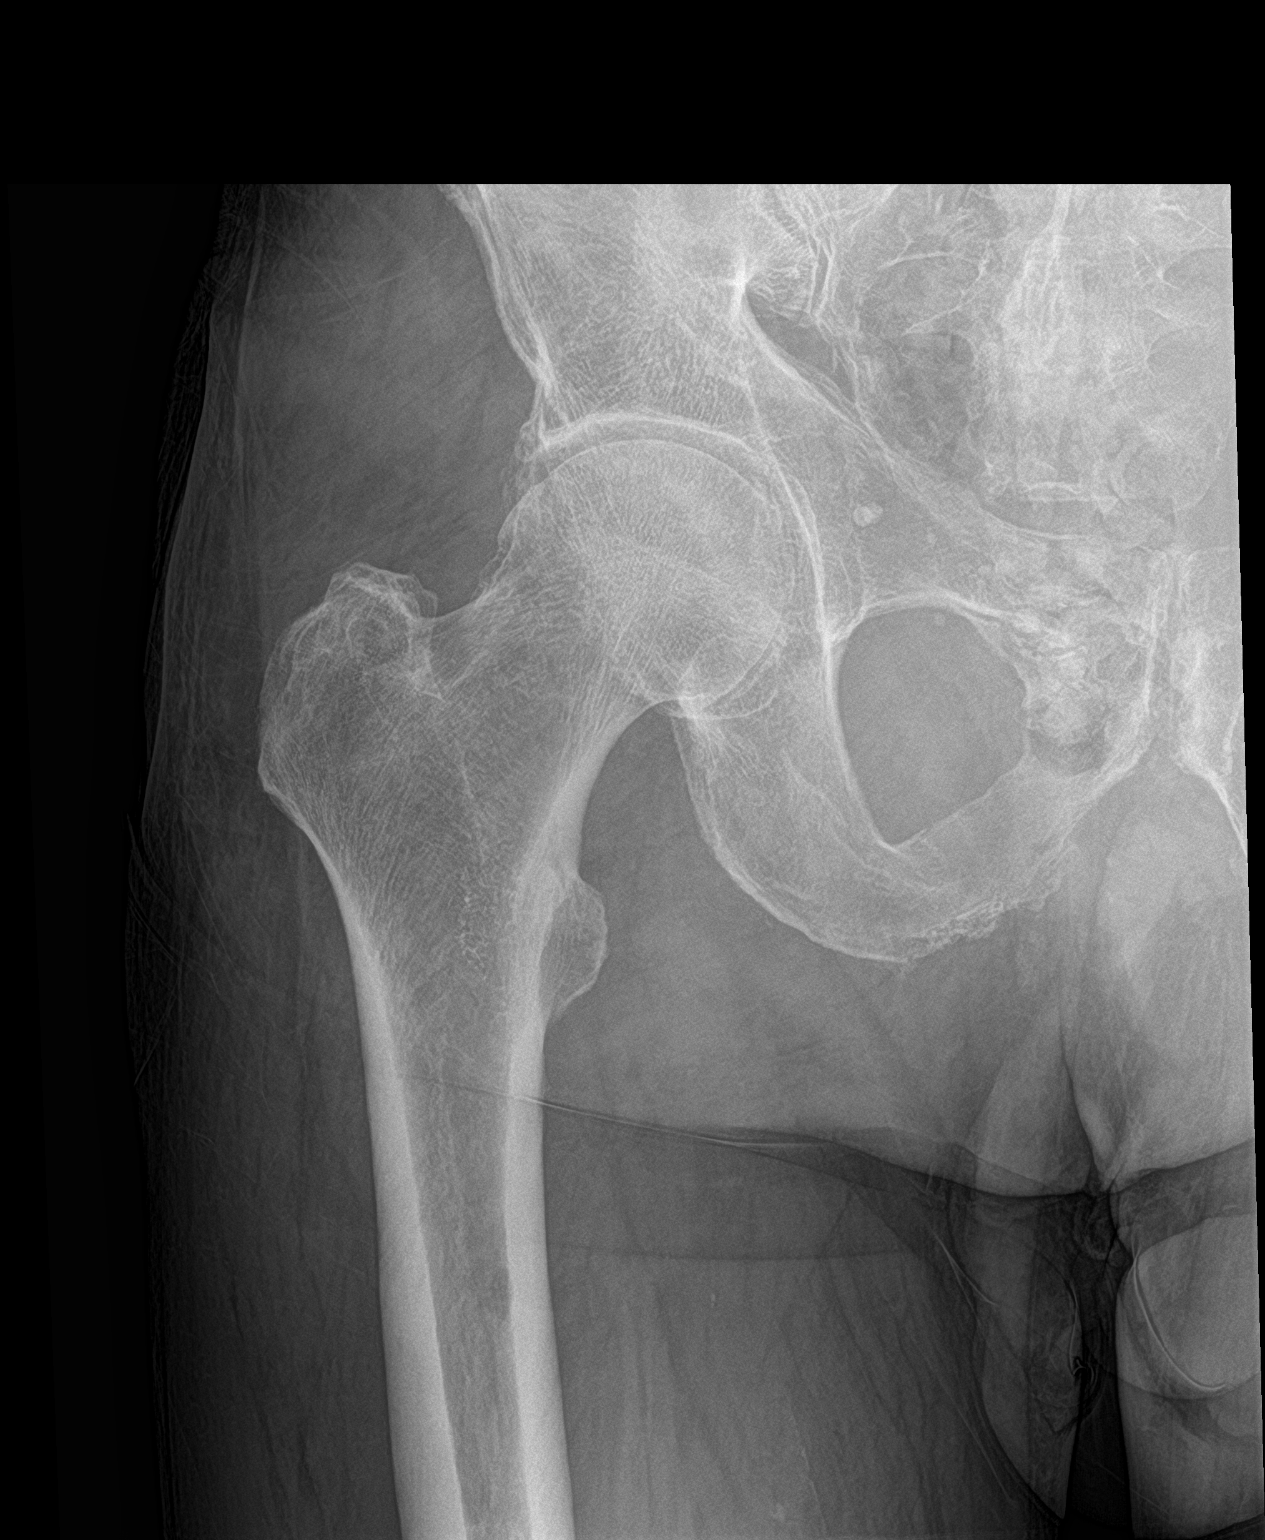

[hip lat]
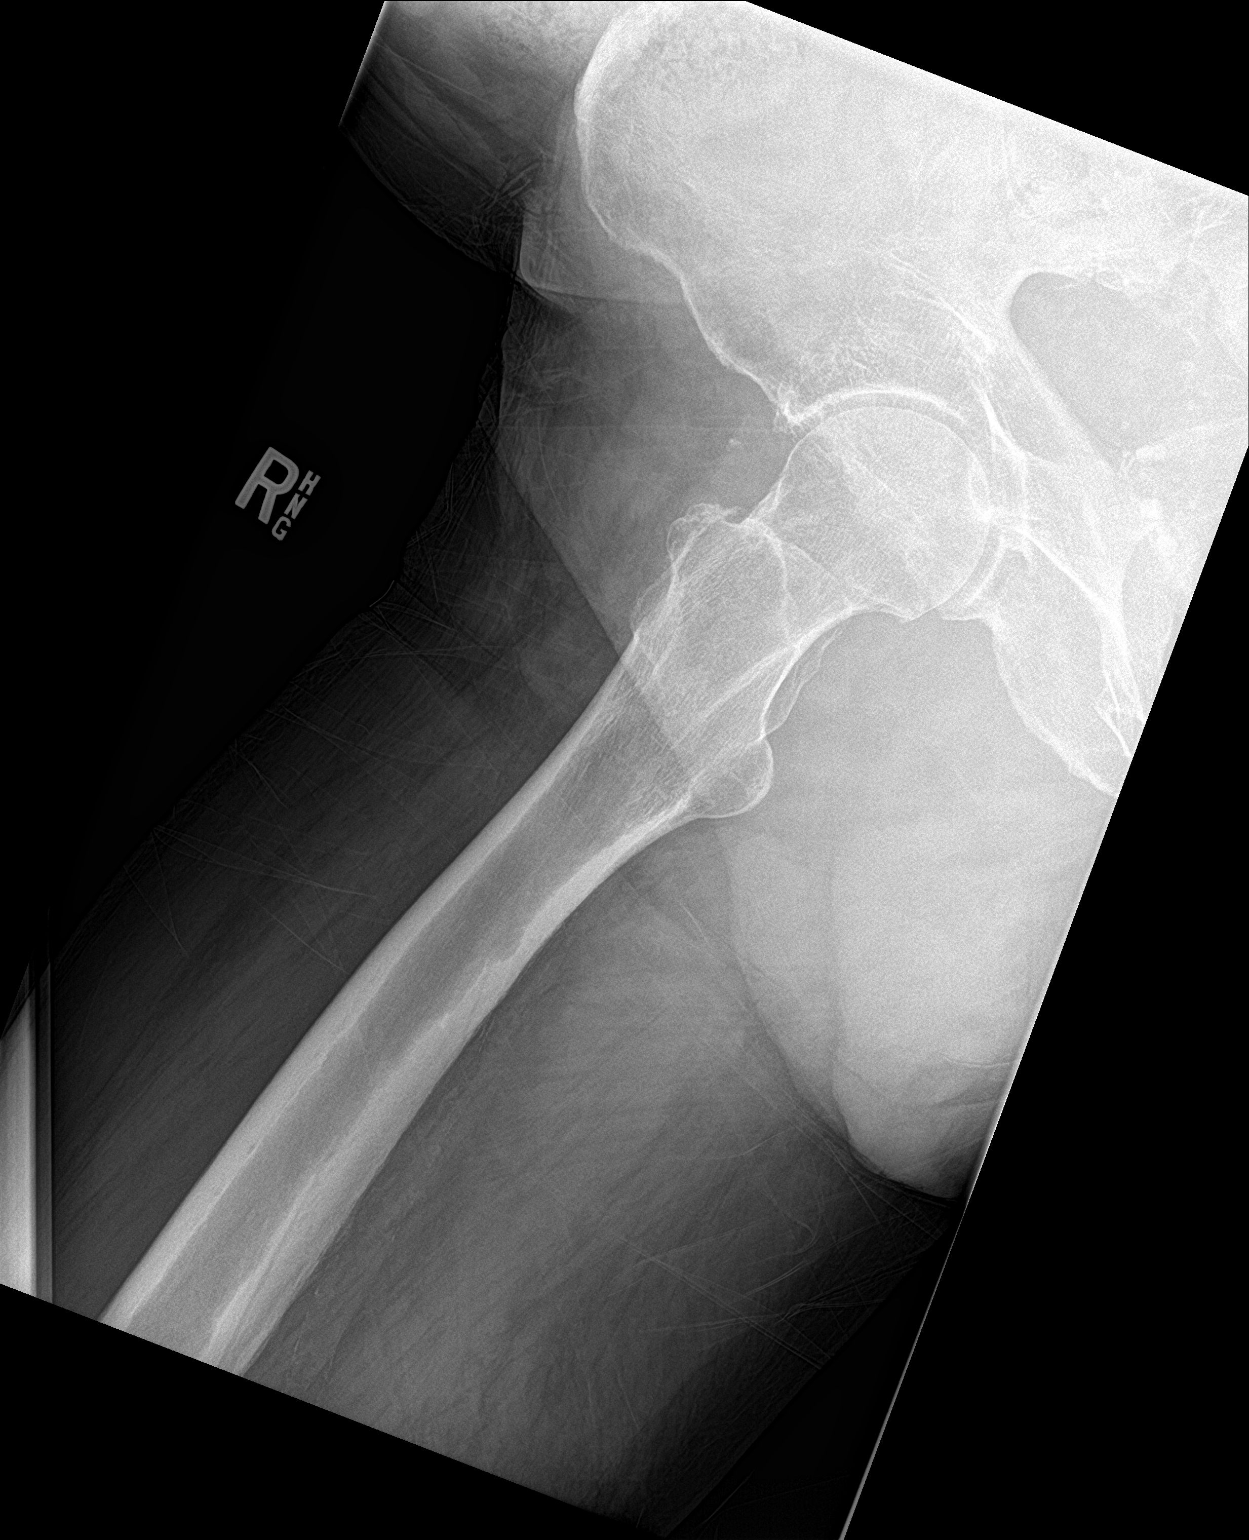

[3 of 3 positions shown; findings below may reference images not displayed]

FINDINGS: Frontal pelvis shows normal SI joints. Symphysis pubis unremarkable.
Linear lucency in the right superior pubic ramus likely Mach effect
from gas in the rectum. No displaced fracture of the right superior
or inferior pubic ramus is evident. Joint space in the hips is
preserved and symmetric. AP and frog-leg lateral views of the right
hip show no femoral neck fracture.
IMPRESSION: No evidence for right femoral neck fracture.

Cortical irregularity is seen along the superior and inferior pubic
rami bilaterally, hindering exclusion of nondisplaced pubic ramus
fracture. If there is clinical concern for fracture of the pubic
rami, oblique pelvic these may prove helpful.

## 2019-06-04 DIAGNOSIS — D045 Carcinoma in situ of skin of trunk: Secondary | ICD-10-CM | POA: Diagnosis not present

## 2019-06-04 DIAGNOSIS — D485 Neoplasm of uncertain behavior of skin: Secondary | ICD-10-CM | POA: Diagnosis not present

## 2019-06-04 DIAGNOSIS — L570 Actinic keratosis: Secondary | ICD-10-CM | POA: Diagnosis not present

## 2019-06-04 DIAGNOSIS — Z85828 Personal history of other malignant neoplasm of skin: Secondary | ICD-10-CM | POA: Diagnosis not present

## 2019-06-04 DIAGNOSIS — L821 Other seborrheic keratosis: Secondary | ICD-10-CM | POA: Diagnosis not present

## 2019-07-29 ENCOUNTER — Other Ambulatory Visit: Payer: Self-pay | Admitting: Adult Health

## 2019-07-29 DIAGNOSIS — L308 Other specified dermatitis: Secondary | ICD-10-CM | POA: Diagnosis not present

## 2019-07-29 DIAGNOSIS — Z85828 Personal history of other malignant neoplasm of skin: Secondary | ICD-10-CM | POA: Diagnosis not present

## 2019-07-29 DIAGNOSIS — E785 Hyperlipidemia, unspecified: Secondary | ICD-10-CM

## 2019-07-31 NOTE — Telephone Encounter (Signed)
SENT TO THE PHARMACY BY E-SCRIBE. 

## 2019-07-31 NOTE — Telephone Encounter (Signed)
Ok to fill for 90 days

## 2019-08-07 ENCOUNTER — Other Ambulatory Visit: Payer: Self-pay

## 2019-08-08 ENCOUNTER — Encounter: Payer: Self-pay | Admitting: Adult Health

## 2019-08-08 ENCOUNTER — Encounter: Payer: Medicare Other | Admitting: Adult Health

## 2019-08-08 ENCOUNTER — Ambulatory Visit (INDEPENDENT_AMBULATORY_CARE_PROVIDER_SITE_OTHER): Payer: Medicare Other | Admitting: Adult Health

## 2019-08-08 VITALS — BP 120/70 | Temp 97.9°F | Ht 71.0 in | Wt 216.0 lb

## 2019-08-08 DIAGNOSIS — M159 Polyosteoarthritis, unspecified: Secondary | ICD-10-CM

## 2019-08-08 DIAGNOSIS — E0840 Diabetes mellitus due to underlying condition with diabetic neuropathy, unspecified: Secondary | ICD-10-CM

## 2019-08-08 DIAGNOSIS — E785 Hyperlipidemia, unspecified: Secondary | ICD-10-CM | POA: Diagnosis not present

## 2019-08-08 DIAGNOSIS — E538 Deficiency of other specified B group vitamins: Secondary | ICD-10-CM

## 2019-08-08 DIAGNOSIS — M8949 Other hypertrophic osteoarthropathy, multiple sites: Secondary | ICD-10-CM | POA: Diagnosis not present

## 2019-08-08 DIAGNOSIS — M150 Primary generalized (osteo)arthritis: Secondary | ICD-10-CM

## 2019-08-08 LAB — VITAMIN B12: Vitamin B-12: 1108 pg/mL — ABNORMAL HIGH (ref 211–911)

## 2019-08-08 LAB — COMPREHENSIVE METABOLIC PANEL
ALT: 10 U/L (ref 0–53)
AST: 14 U/L (ref 0–37)
Albumin: 3.8 g/dL (ref 3.5–5.2)
Alkaline Phosphatase: 63 U/L (ref 39–117)
BUN: 27 mg/dL — ABNORMAL HIGH (ref 6–23)
CO2: 26 mEq/L (ref 19–32)
Calcium: 8.9 mg/dL (ref 8.4–10.5)
Chloride: 104 mEq/L (ref 96–112)
Creatinine, Ser: 1.22 mg/dL (ref 0.40–1.50)
GFR: 56.93 mL/min — ABNORMAL LOW (ref >60.00)
Glucose, Bld: 108 mg/dL — ABNORMAL HIGH (ref 70–99)
Potassium: 4.2 mEq/L (ref 3.5–5.1)
Sodium: 137 mEq/L (ref 135–145)
Total Bilirubin: 0.4 mg/dL (ref 0.2–1.2)
Total Protein: 6.6 g/dL (ref 6.0–8.3)

## 2019-08-08 LAB — CBC WITH DIFFERENTIAL/PLATELET
Basophils Absolute: 0 10*3/uL (ref 0.0–0.1)
Basophils Relative: 0.4 % (ref 0.0–3.0)
Eosinophils Absolute: 0.1 10*3/uL (ref 0.0–0.7)
Eosinophils Relative: 2.7 % (ref 0.0–5.0)
HCT: 40 % (ref 39.0–52.0)
Hemoglobin: 13.3 g/dL (ref 13.0–17.0)
Lymphocytes Relative: 22.8 % (ref 12.0–46.0)
Lymphs Abs: 1.1 10*3/uL (ref 0.7–4.0)
MCHC: 33.3 g/dL (ref 30.0–36.0)
MCV: 90.7 fl (ref 78.0–100.0)
Monocytes Absolute: 0.5 10*3/uL (ref 0.1–1.0)
Monocytes Relative: 11.3 % (ref 3.0–12.0)
Neutro Abs: 3 10*3/uL (ref 1.4–7.7)
Neutrophils Relative %: 62.8 % (ref 43.0–77.0)
Platelets: 190 10*3/uL (ref 150.0–400.0)
RBC: 4.42 Mil/uL (ref 4.22–5.81)
RDW: 14.3 % (ref 11.5–15.5)
WBC: 4.8 10*3/uL (ref 4.0–10.5)

## 2019-08-08 LAB — LIPID PANEL
Cholesterol: 218 mg/dL — ABNORMAL HIGH (ref 0–200)
HDL: 40.7 mg/dL (ref >39.00)
NonHDL: 177.78
Total CHOL/HDL Ratio: 5
Triglycerides: 313 mg/dL — ABNORMAL HIGH (ref 0.0–149.0)
VLDL: 62.6 mg/dL — ABNORMAL HIGH (ref 0.0–40.0)

## 2019-08-08 LAB — HEMOGLOBIN A1C: Hgb A1c MFr Bld: 6.6 % — ABNORMAL HIGH (ref 4.6–6.5)

## 2019-08-08 LAB — TSH: TSH: 2.58 u[IU]/mL (ref 0.35–4.50)

## 2019-08-08 LAB — LDL CHOLESTEROL, DIRECT: Direct LDL: 121 mg/dL

## 2019-08-08 NOTE — Progress Notes (Signed)
Subjective:    Patient ID: Tyler Greer, male    DOB: 1935/09/18, 84 y.o.   MRN: [id number]  HPI   Patient presents for yearly preventative medicine examination. She is a pleasant and remarkable  84 year old male who  has a past medical history of Arthritis, Basal cell carcinoma, arm, Basal cell carcinoma, face, Diabetes mellitus type II (10/2007), Intention tremor, Migraine, Osteoarthritis, Paresthesia of foot, and Stroke ([place]) (2005).   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  He does not check his blood sugars at home but reports no episodes of hypoglycemia.  Lab Results  Component Value Date   HGBA1C 6.5 10/10/2018   Osteoarthritis -he takes 2 Aleve daily as needed which seems to work well for him.  Lab Results  Component Value Date   CHOL 239 (H) 07/25/2017   HDL 36.70 (L) 07/25/2017   LDLCALC 147 (H) 08/28/2013   LDLDIRECT 127.0 07/25/2017   TRIG 348.0 (H) 07/25/2017   CHOLHDL 7 07/25/2017   Hyperlipidemia -was not placed on a statin by his last PCP.  We will recheck his lipid panel today Lab Results  Component Value Date   CHOL 239 (H) 07/25/2017   HDL 36.70 (L) 07/25/2017   LDLCALC 147 (H) 08/28/2013   LDLDIRECT 127.0 07/25/2017   TRIG 348.0 (H) 07/25/2017   CHOLHDL 7 07/25/2017   B12 deficiency -takes over-the-counter B12 supplementation. Lab Results  Component Value Date   VITAMINB12 256 10/10/2018    All immunizations and health maintenance protocols were reviewed with the patient and needed orders were placed.  Appropriate screening laboratory values were ordered for the patient including screening of hyperlipidemia, renal function and hepatic function.   Medication reconciliation,  past medical history, social history, problem list and allergies were reviewed in detail with the patient  Goals were established with regard to weight loss, exercise, and  diet in compliance with  medications. He is working out multiple times a week at [employer] and eats a healthy diet.   End of life planning was discussed.  He has no acute issues he would like to talk about    Review of Systems  Constitutional: Negative.   HENT: Negative.   Eyes: Negative.   Respiratory: Negative.   Cardiovascular: Negative.   Gastrointestinal: Negative.   Endocrine: Negative.   Genitourinary: Negative.   Musculoskeletal: Positive for arthralgias.  Skin: Negative.   Allergic/Immunologic: Negative.   Neurological: Negative.   Hematological: Negative.   Psychiatric/Behavioral: Negative.   All other systems reviewed and are negative.  Past Medical History:  Diagnosis Date  . Arthritis    "hands and knees" (06/10/2015)  . Basal cell carcinoma, arm    "both"  . Basal cell carcinoma, face   . Diabetes mellitus type II 10/2007  . Intention tremor   . Migraine    "none in years" (06/10/2015)  . Osteoarthritis   . Paresthesia of foot    bilateral  . Stroke ([place]) 2005   mini stroke    Social History   Socioeconomic History  . Marital status: Widowed    Spouse name: Not on file  . Number of children: Not on file  . Years of education: Not on file  . Highest education level: Not on file  Occupational History  . Not on file  Tobacco Use  . Smoking status: Never Smoker  . Smokeless tobacco: Never Used  . Tobacco comment: "might  have smoked 1 pack of cigarettes in my life"  Substance and Sexual Activity  . Alcohol use: No  . Drug use: No  . Sexual activity: Not Currently  Other Topics Concern  . Not on file  Social History Narrative   Retired Clinical biochemist    Social Determinants of Radio broadcast assistant Strain:   . Difficulty of Paying Living Expenses:   Food Insecurity:   . Worried About Charity fundraiser in the Last Year:   . Arboriculturist in the Last Year:   Transportation Needs:   . Film/video editor (Medical):   Marland Kitchen Lack of Transportation  (Non-Medical):   Physical Activity:   . Days of Exercise per Week:   . Minutes of Exercise per Session:   Stress:   . Feeling of Stress :   Social Connections:   . Frequency of Communication with Friends and Family:   . Frequency of Social Gatherings with Friends and Family:   . Attends Religious Services:   . Active Member of Clubs or Organizations:   . Attends Archivist Meetings:   Marland Kitchen Marital Status:   Intimate Partner Violence:   . Fear of Current or Ex-Partner:   . Emotionally Abused:   Marland Kitchen Physically Abused:   . Sexually Abused:     Past Surgical History:  Procedure Laterality Date  . BACK SURGERY    . HAMMER TOE SURGERY Bilateral   . JOINT REPLACEMENT    . KNEE ARTHROPLASTY Left 06/10/2015   Procedure: COMPUTER ASSISTED TOTAL KNEE ARTHROPLASTY;  Surgeon: Marybelle Killings, MD;  Location: [facility];  Service: Orthopedics;  Laterality: Left;  . KNEE ARTHROSCOPY Bilateral 2002  . LUMBAR LAMINECTOMY  X 2  . TONSILLECTOMY    . TOTAL KNEE ARTHROPLASTY Left 06/10/2015    Family History  Problem Relation Age of Onset  . Heart disease Father        died age 83-MI  . Diabetes type II Brother   . Melanoma Brother     No Known Allergies  Current Outpatient Medications on File Prior to Visit  Medication Sig Dispense Refill  . fluticasone (FLONASE) 50 MCG/ACT nasal spray Place 2 sprays into both nostrils daily. 16 g 6  . metFORMIN (GLUCOPHAGE-XR) 500 MG 24 hr tablet TAKE ONE TABLET DAILY WITH BREAKFAST 180 tablet 0  . Multiple Vitamins-Minerals (ZINC PO) Take by mouth.    . naproxen sodium (ALEVE) 220 MG tablet Take 440 mg by mouth daily.    . Triamcinolone Acetonide (TRIAMCINOLONE 0.1 % CREAM : EUCERIN) CREA Apply 1 application topically 2 (two) times daily. 1 each 3   No current facility-administered medications on file prior to visit.    There were no vitals taken for this visit.      Objective:   Physical Exam Vitals and nursing note reviewed.  Constitutional:        General: He is not in acute distress.    Appearance: Normal appearance. He is well-developed and normal weight.  HENT:     Head: Normocephalic and atraumatic.     Right Ear: Tympanic membrane, ear canal and external ear normal. There is no impacted cerumen.     Left Ear: Tympanic membrane, ear canal and external ear normal. There is no impacted cerumen.     Nose: Nose normal. No congestion or rhinorrhea.     Mouth/Throat:     Mouth: Mucous membranes are moist.     Pharynx: Oropharynx is clear.  No oropharyngeal exudate or posterior oropharyngeal erythema.  Eyes:     General:        Right eye: No discharge.        Left eye: No discharge.     Extraocular Movements: Extraocular movements intact.     Conjunctiva/sclera: Conjunctivae normal.     Pupils: Pupils are equal, round, and reactive to light.  Neck:     Vascular: No carotid bruit.     Trachea: No tracheal deviation.  Cardiovascular:     Rate and Rhythm: Normal rate and regular rhythm.     Pulses: Normal pulses.     Heart sounds: Normal heart sounds. No murmur heard.  No friction rub. No gallop.   Pulmonary:     Effort: Pulmonary effort is normal. No respiratory distress.     Breath sounds: Normal breath sounds. No stridor. No wheezing, rhonchi or rales.  Chest:     Chest wall: No tenderness.  Abdominal:     General: Bowel sounds are normal. There is no distension.     Palpations: Abdomen is soft. There is no mass.     Tenderness: There is no abdominal tenderness. There is no right CVA tenderness, left CVA tenderness, guarding or rebound.     Hernia: No hernia is present.  Musculoskeletal:        General: No swelling, tenderness, deformity or signs of injury. Normal range of motion.     Right lower leg: No edema.     Left lower leg: No edema.  Lymphadenopathy:     Cervical: No cervical adenopathy.  Skin:    General: Skin is warm and dry.     Capillary Refill: Capillary refill takes less than 2 seconds.      Coloration: Skin is not jaundiced or pale.     Findings: No bruising, erythema, lesion or rash.  Neurological:     General: No focal deficit present.     Mental Status: He is alert and oriented to person, place, and time.     Cranial Nerves: No cranial nerve deficit.     Sensory: No sensory deficit.     Motor: No weakness.     Coordination: Coordination normal.     Gait: Gait normal.     Deep Tendon Reflexes: Reflexes normal.  Psychiatric:        Mood and Affect: Mood normal.        Behavior: Behavior normal.        Thought Content: Thought content normal.        Judgment: Judgment normal.       Assessment & Plan:  1. Diabetes mellitus due to underlying condition with diabetic neuropathy, without long-term current use of insulin (HCC) - consider increase in metformin - likely follow up in 6 months   - CBC with Differential/Platelet - Comprehensive metabolic panel - Hemoglobin A1c - Lipid panel - TSH  2. Dyslipidemia - Likely need to add statin  - CBC with Differential/Platelet - Comprehensive metabolic panel - Hemoglobin A1c - Lipid panel - TSH  3. Primary osteoarthritis involving multiple joints - Can continue Aleve   4. B12 deficiency  - Vitamin B12  Dorothyann Peng, NP

## 2019-08-08 NOTE — Patient Instructions (Signed)
It was great seeing you today   We will follow up with you regarding your blood work   Please follow up in 6 months or sooner if needed   

## 2019-08-09 ENCOUNTER — Other Ambulatory Visit: Payer: Self-pay | Admitting: Family Medicine

## 2019-08-09 MED ORDER — ROSUVASTATIN CALCIUM 10 MG PO TABS
10.0000 mg | ORAL_TABLET | Freq: Every day | ORAL | 1 refills | Status: DC
Start: 2019-08-09 — End: 2020-04-01

## 2019-08-09 NOTE — Telephone Encounter (Signed)
Sent to the pharmacy by e-scribe. 

## 2019-12-07 DIAGNOSIS — Z23 Encounter for immunization: Secondary | ICD-10-CM | POA: Diagnosis not present

## 2019-12-10 DIAGNOSIS — Z85828 Personal history of other malignant neoplasm of skin: Secondary | ICD-10-CM | POA: Diagnosis not present

## 2019-12-10 DIAGNOSIS — L738 Other specified follicular disorders: Secondary | ICD-10-CM | POA: Diagnosis not present

## 2019-12-10 DIAGNOSIS — L570 Actinic keratosis: Secondary | ICD-10-CM | POA: Diagnosis not present

## 2019-12-10 DIAGNOSIS — D692 Other nonthrombocytopenic purpura: Secondary | ICD-10-CM | POA: Diagnosis not present

## 2019-12-10 DIAGNOSIS — L821 Other seborrheic keratosis: Secondary | ICD-10-CM | POA: Diagnosis not present

## 2019-12-10 DIAGNOSIS — D1801 Hemangioma of skin and subcutaneous tissue: Secondary | ICD-10-CM | POA: Diagnosis not present

## 2020-01-02 ENCOUNTER — Telehealth: Payer: Self-pay | Admitting: Adult Health

## 2020-01-02 NOTE — Telephone Encounter (Signed)
Left message for patient to call back and schedule Medicare Annual Wellness Visit (AWV) either virtually or in office.  Last AWV 07/22/2016  Please schedule at any time with [facility] with the Nurse Health Advisor 1.  45 minute appointment

## 2020-01-07 ENCOUNTER — Ambulatory Visit (INDEPENDENT_AMBULATORY_CARE_PROVIDER_SITE_OTHER): Payer: Medicare Other

## 2020-01-07 ENCOUNTER — Other Ambulatory Visit: Payer: Self-pay

## 2020-01-07 DIAGNOSIS — Z23 Encounter for immunization: Secondary | ICD-10-CM

## 2020-02-05 ENCOUNTER — Ambulatory Visit (INDEPENDENT_AMBULATORY_CARE_PROVIDER_SITE_OTHER): Payer: Medicare Other | Admitting: Adult Health

## 2020-02-05 ENCOUNTER — Encounter: Payer: Self-pay | Admitting: Adult Health

## 2020-02-05 ENCOUNTER — Other Ambulatory Visit: Payer: Self-pay

## 2020-02-05 VITALS — BP 135/70 | HR 90 | Temp 98.3°F | Resp 18 | Ht 71.0 in | Wt 210.0 lb

## 2020-02-05 DIAGNOSIS — E0840 Diabetes mellitus due to underlying condition with diabetic neuropathy, unspecified: Secondary | ICD-10-CM

## 2020-02-05 DIAGNOSIS — S61432A Puncture wound without foreign body of left hand, initial encounter: Secondary | ICD-10-CM | POA: Diagnosis not present

## 2020-02-05 DIAGNOSIS — E785 Hyperlipidemia, unspecified: Secondary | ICD-10-CM

## 2020-02-05 MED ORDER — DOXYCYCLINE HYCLATE 100 MG PO CAPS: 100.0000 mg | ORAL_CAPSULE | Freq: Two times a day (BID) | ORAL | 0 refills | Status: DC

## 2020-02-05 NOTE — Addendum Note (Signed)
Addended by: Marrion Coy on: 02/05/2020 09:24 AM   Modules accepted: Orders

## 2020-02-05 NOTE — Progress Notes (Signed)
Subjective:    Patient ID: LES LONGMORE, male    DOB: 01/03/1936, 84 y.o.   MRN: [id number]  HPI 84 year old male who  has a past medical history of Arthritis, Basal cell carcinoma, arm, Basal cell carcinoma, face, Diabetes mellitus type II (10/2007), Intention tremor, Migraine, Osteoarthritis, Paresthesia of foot, and Stroke ([place]) (2005).  He presents to the office today for 79-monthfollow-up regarding diabetes and high cholesterol.  When he was seen for his physical exam in June 2021 his cholesterol panel is elevated and he was started on Crestor 10 mg at bedtime.  He reports that he has done well with this medication has no myalgia or fatigue.  Is also been working to cut out fats in his diet.  Lab Results  Component Value Date   CHOL 218 (H) 08/08/2019   HDL 40.70 08/08/2019   LDLCALC 147 (H) 08/28/2013   LDLDIRECT 121.0 08/08/2019   TRIG 313.0 (H) 08/08/2019   CHOLHDL 5 08/08/2019   Diabetes is well controlled on Metformin 500 mg extended release daily.  He does not check his blood sugars at home but reports no episodes of hypoglycemia. Lab Results  Component Value Date   HGBA1C 6.6 (H) 08/08/2019    C[place]lette Pa yesterday he was putting up a fence and using a staple gun.  He accidentally stapled his left palm.  He was able to remove the staple without difficulty and has no loss of range of motion or pain at the site of insertion. He is up to date on Tetanus   Review of Systems See HPI   Past Medical History:  Diagnosis Date   Arthritis    "hands and knees" (06/10/2015)   Basal cell carcinoma, arm    "both"   Basal cell carcinoma, face    Diabetes mellitus type II 10/2007   Intention tremor    Migraine    "none in years" (06/10/2015)   Osteoarthritis    Paresthesia of foot    bilateral   [place]   mini stroke    Social History   Socioeconomic History   Marital status: Widowed    Spouse name: Not on file   Number of children: Not on file    Years of education: Not on file   Highest education level: Not on file  Occupational History   Not on file  Tobacco Use   Smoking status: Never Smoker   Smokeless tobacco: Never Used   Tobacco comment: "might have smoked 1 pack of cigarettes in my life"  Substance and Sexual Activity   Alcohol use: No   Drug use: No   Sexual activity: Not Currently  Other Topics Concern   Not on file  Social History Narrative   Retired GClinical biochemist   Social Determinants of Health   Financial Resource Strain: Not on file  Food Insecurity: Not on file  Transportation Needs: Not on file  Physical Activity: Not on file  Stress: Not on file  Social Connections: Not on file  Intimate Partner Violence: Not on file    Past Surgical History:  Procedure Laterality Date   BACK SURGERY     HAMMER TOE SURGERY Bilateral    JOINT REPLACEMENT     KNEE ARTHROPLASTY Left 06/10/2015   Procedure: COMPUTER ASSISTED TOTAL KNEE ARTHROPLASTY;  Surgeon: MMarybelle Killings MD;  [facility]  Service: Orthopedics;  Laterality: Left;   KNEE ARTHROSCOPY Bilateral 2002   LUMBAR LAMINECTOMY  X 2  TONSILLECTOMY     TOTAL KNEE ARTHROPLASTY Left 06/10/2015    Family History  Problem Relation Age of Onset   Heart disease Father        died age 38-MI   Diabetes type II Brother    Melanoma Brother     No Known Allergies  Current Outpatient Medications on File Prior to Visit  Medication Sig Dispense Refill   metFORMIN (GLUCOPHAGE-XR) 500 MG 24 hr tablet TAKE ONE TABLET DAILY WITH BREAKFAST 180 tablet 0   Multiple Vitamins-Minerals (ZINC PO) Take by mouth.     rosuvastatin (CRESTOR) 10 MG tablet Take 1 tablet (10 mg total) by mouth at bedtime. 90 tablet 1   Triamcinolone Acetonide (TRIAMCINOLONE 0.1 % CREAM : EUCERIN) CREA Apply 1 application topically 2 (two) times daily. 1 each 3   fluticasone (FLONASE) 50 MCG/ACT nasal spray Place 2 sprays into both nostrils daily. (Patient not  taking: Reported on 02/05/2020) 16 g 6   No current facility-administered medications on file prior to visit.    BP 135/70    Pulse 90    Temp 98.3 F (36.8 C) (Oral)    Resp 18    Ht _0  (1.803 m)    Wt 210 lb (95.3 kg)    SpO2 98%    BMI 29.29 kg/m       Objective:   Physical Exam Vitals and nursing note reviewed.  Constitutional:      Appearance: Normal appearance.  Skin:    General: Skin is warm and dry.     Capillary Refill: Capillary refill takes less than 2 seconds.     Comments: Small insertion wounds to left palm below his thumb.  No localized erythremia or edema.  No pain with palpation is able to have full range of motion  Neurological:     General: No focal deficit present.     Mental Status: He is alert and oriented to person, place, and time.  Psychiatric:        Mood and Affect: Mood normal.        Behavior: Behavior normal.        Thought Content: Thought content normal.        Judgment: Judgment normal.        Assessment & Plan:  1. Diabetes mellitus due to underlying condition with diabetic neuropathy, without long-term current use of insulin ([place]) - Consider increase in metformin  - Hemoglobin A1c; Future  2. Dyslipidemia - Consider increase in statin - Lipid panel; Future - CMP with eGFR([facility]); Future  3. Puncture wound of left hand without foreign body, initial encounter  - doxycycline (VIBRAMYCIN) 100 MG capsule; Take 1 capsule (100 mg total) by mouth 2 (two) times daily.  Dispense: 14 capsule; Refill: 0   Dorothyann Peng, NP

## 2020-02-06 LAB — COMPLETE METABOLIC PANEL WITH GFR
AG Ratio: 1.4 (calc) (ref 1.0–2.5)
ALT: 13 U/L (ref 9–46)
AST: 18 U/L (ref 10–35)
Albumin: 3.8 g/dL (ref 3.6–5.1)
Alkaline phosphatase (APISO): 73 U/L (ref 35–144)
BUN/Creatinine Ratio: 24 (calc) — ABNORMAL HIGH (ref 6–22)
BUN: 27 mg/dL — ABNORMAL HIGH (ref 7–25)
CO2: 24 mmol/L (ref 20–32)
Calcium: 9.1 mg/dL (ref 8.6–10.3)
Chloride: 106 mmol/L (ref 98–110)
Creat: 1.12 mg/dL — ABNORMAL HIGH (ref 0.70–1.11)
GFR, Est African American: 71 mL/min/{1.73_m2} (ref >=60)
GFR, Est Non African American: 61 mL/min/{1.73_m2} (ref >=60)
Globulin: 2.8 g/dL (calc) (ref 1.9–3.7)
Glucose, Bld: 103 mg/dL — ABNORMAL HIGH (ref 65–99)
Potassium: 4.9 mmol/L (ref 3.5–5.3)
Sodium: 138 mmol/L (ref 135–146)
Total Bilirubin: 0.6 mg/dL (ref 0.2–1.2)
Total Protein: 6.6 g/dL (ref 6.1–8.1)

## 2020-02-06 LAB — LIPID PANEL
Cholesterol: 227 mg/dL — ABNORMAL HIGH (ref <200)
HDL: 48 mg/dL (ref >=40)
LDL Cholesterol (Calc): 148 mg/dL (calc) — ABNORMAL HIGH
Non-HDL Cholesterol (Calc): 179 mg/dL (calc) — ABNORMAL HIGH (ref <130)
Total CHOL/HDL Ratio: 4.7 (calc) (ref <5.0)
Triglycerides: 172 mg/dL — ABNORMAL HIGH (ref <150)

## 2020-02-06 LAB — HEMOGLOBIN A1C
Hgb A1c MFr Bld: 6.5 % of total Hgb — ABNORMAL HIGH (ref <5.7)
Mean Plasma Glucose: 140 mg/dL
eAG (mmol/L): 7.7 mmol/L

## 2020-02-07 ENCOUNTER — Telehealth: Payer: Self-pay | Admitting: Adult Health

## 2020-02-07 NOTE — Telephone Encounter (Signed)
Patient is calling and stated that he is returning the call, please advise. CB is [phone]

## 2020-02-10 ENCOUNTER — Other Ambulatory Visit: Payer: Self-pay | Admitting: Adult Health

## 2020-02-10 DIAGNOSIS — E785 Hyperlipidemia, unspecified: Secondary | ICD-10-CM

## 2020-02-11 DIAGNOSIS — L570 Actinic keratosis: Secondary | ICD-10-CM | POA: Diagnosis not present

## 2020-02-11 DIAGNOSIS — H019 Unspecified inflammation of eyelid: Secondary | ICD-10-CM | POA: Diagnosis not present

## 2020-02-11 DIAGNOSIS — Z85828 Personal history of other malignant neoplasm of skin: Secondary | ICD-10-CM | POA: Diagnosis not present

## 2020-02-11 DIAGNOSIS — H02135 Senile ectropion of left lower eyelid: Secondary | ICD-10-CM | POA: Diagnosis not present

## 2020-02-11 LAB — HM DIABETES EYE EXAM

## 2020-02-11 NOTE — Telephone Encounter (Signed)
Left message for patient to call back regarding results and recommendations.  His A1c has stayed stable at 6.5   Trig have improved but total cholesterol and LDL continue to be elevated. I would like to increase statin to 20 mg

## 2020-04-01 ENCOUNTER — Telehealth: Payer: Self-pay | Admitting: Adult Health

## 2020-04-01 MED ORDER — ROSUVASTATIN CALCIUM 20 MG PO TABS: 20.0000 mg | ORAL_TABLET | Freq: Every day | ORAL | 1 refills | Status: DC

## 2020-04-01 NOTE — Addendum Note (Signed)
Addended by: Miles Costain T on: 04/01/2020 02:50 PM   Modules accepted: Orders

## 2020-04-01 NOTE — Telephone Encounter (Signed)
Spoke to patient about awv appointment   He stated he needs refill on crestor  He stated Tommi Rumps was going to increase meds to 20mg  [employer] [facility]

## 2020-04-01 NOTE — Telephone Encounter (Signed)
Sent to the pharmacy by e-scribe. 

## 2020-04-14 NOTE — Progress Notes (Unsigned)
Subjective:   Tyler Greer is a 85 y.o. male who presents for Medicare Annual/Subsequent preventive examination.  Review of Systems    N/A  Cardiac Risk Factors include: advanced age (>57men, >59 women);male gender;dyslipidemia;diabetes mellitus     Objective:    Today's Vitals   04/16/20 0938  BP: 126/78  Pulse: 75  Temp: 98.1 F (36.7 C)  TempSrc: Oral  SpO2: 98%  Weight: 210 lb 2 oz (95.3 kg)  Height: 5\' 11"  (1.803 m)   Body mass index is 29.31 kg/m.  Advanced Directives 04/16/2020 09/09/2016 07/01/2015 06/30/2015 06/10/2015 06/01/2015  Does Patient Have a Medical Advance Directive? No No No No No No  Would patient like information on creating a medical advance directive? No - Patient declined - No - patient declined information No - patient declined information No - patient declined information Yes - Educational materials given    Current Medications (verified) Outpatient Encounter Medications as of 04/16/2020  Medication Sig  . metFORMIN (GLUCOPHAGE-XR) 500 MG 24 hr tablet TAKE ONE TABLET DAILY WITH BREAKFAST  . Multiple Vitamins-Minerals (ZINC PO) Take by mouth.  . rosuvastatin (CRESTOR) 20 MG tablet Take 1 tablet (20 mg total) by mouth daily.  . fluticasone (FLONASE) 50 MCG/ACT nasal spray Place 2 sprays into both nostrils daily. (Patient not taking: No sig reported)  . Triamcinolone Acetonide (TRIAMCINOLONE 0.1 % CREAM : EUCERIN) CREA Apply 1 application topically 2 (two) times daily. (Patient not taking: Reported on 04/16/2020)  . [DISCONTINUED] doxycycline (VIBRAMYCIN) 100 MG capsule Take 1 capsule (100 mg total) by mouth 2 (two) times daily.   No facility-administered encounter medications on file as of 04/16/2020.    Allergies (verified) Patient has no known allergies.   History: Past Medical History:  Diagnosis Date  . Arthritis    "hands and knees" (06/10/2015)  . Basal cell carcinoma, arm    "both"  . Basal cell carcinoma, face   . Diabetes mellitus  type II 10/2007  . Intention tremor   . Migraine    "none in years" (06/10/2015)  . Osteoarthritis   . Paresthesia of foot    bilateral  . Stroke ([place]) 2005   mini stroke   Past Surgical History:  Procedure Laterality Date  . BACK SURGERY    . HAMMER TOE SURGERY Bilateral   . JOINT REPLACEMENT    . KNEE ARTHROPLASTY Left 06/10/2015   Procedure: COMPUTER ASSISTED TOTAL KNEE ARTHROPLASTY;  Surgeon: Marybelle Killings, MD;  Location: [facility];  Service: Orthopedics;  Laterality: Left;  . KNEE ARTHROSCOPY Bilateral 2002  . LUMBAR LAMINECTOMY  X 2  . TONSILLECTOMY    . TOTAL KNEE ARTHROPLASTY Left 06/10/2015   Family History  Problem Relation Age of Onset  . Heart disease Father        died age 58-MI  . Diabetes type II Brother   . Melanoma Brother    Social History   Socioeconomic History  . Marital status: Widowed    Spouse name: Not on file  . Number of children: Not on file  . Years of education: Not on file  . Highest education level: Not on file  Occupational History  . Not on file  Tobacco Use  . Smoking status: Never Smoker  . Smokeless tobacco: Never Used  . Tobacco comment: "might have smoked 1 pack of cigarettes in my life"  Substance and Sexual Activity  . Alcohol use: No  . Drug use: No  . Sexual activity: Not Currently  Other  Topics Concern  . Not on file  Social History Narrative   Retired Clinical biochemist    Social Determinants of Health   Financial Resource Strain: [place]   . Difficulty of Paying Living Expenses: Not hard at all  Food Insecurity: No Food Insecurity  . Worried About Charity fundraiser in the Last Year: Never true  . Ran Out of Food in the Last Year: Never true  Transportation Needs: No Transportation Needs  . Lack of Transportation (Medical): No  . Lack of Transportation (Non-Medical): No  Physical Activity: Sufficiently Active  . Days of Exercise per Week: 5 days  . Minutes of Exercise per Session: 30 min  Stress: No Stress Concern  Present  . Feeling of Stress : Not at all  Social Connections: Moderately Isolated  . Frequency of Communication with Friends and Family: More than three times a week  . Frequency of Social Gatherings with Friends and Family: More than three times a week  . Attends Religious Services: 1 to 4 times per year  . Active Member of Clubs or Organizations: No  . Attends Archivist Meetings: Never  . Marital Status: Widowed    Tobacco Counseling Counseling given: Not Answered Comment: "might have smoked 1 pack of cigarettes in my life"   Clinical Intake:  Pre-visit preparation completed: Yes  Pain : No/denies pain     Nutritional Risks: None Diabetes: Yes CBG done?: No Did pt. bring in CBG monitor from home?: No  How often do you need to have someone help you when you read instructions, pamphlets, or other written materials from your doctor or pharmacy?: 2 - Rarely (Sometimes due to vision and print of font being small)  Diabetic?Yes Nutrition Risk Assessment:  Has the patient had any N/V/D within the last 2 months?  No  Does the patient have any non-healing wounds?  No  Has the patient had any unintentional weight loss or weight gain?  No   Diabetes:  Is the patient diabetic?  Yes  If diabetic, was a CBG obtained today?  No  Did the patient bring in their glucometer from home?  No  How often do you monitor your CBG's? States does not check glucose .   Financial Strains and Diabetes Management:  Are you having any financial strains with the device, your supplies or your medication? No .  Does the patient want to be seen by Chronic Care Management for management of their diabetes?  No  Would the patient like to be referred to a Nutritionist or for Diabetic Management?  No   Diabetic Exams:  Diabetic Eye Exam: Completed 02/11/2020 Diabetic Foot Exam: Completed 08/08/2019   Interpreter Needed?: No  Information entered by :: Roy of Daily  Living In your present state of health, do you have any difficulty performing the following activities: 04/16/2020  Hearing? N  Vision? Y  Comment has some blurred vision when reading  Difficulty concentrating or making decisions? N  Walking or climbing stairs? Y  Comment has some knee pain  Dressing or bathing? N  Doing errands, shopping? N  Preparing Food and eating ? N  Using the Toilet? N  In the past six months, have you accidently leaked urine? N  Do you have problems with loss of bowel control? N  Managing your Medications? N  Managing your Finances? N  Housekeeping or managing your Housekeeping? N  Some recent data might be hidden    Patient Care  Team: Dorothyann Peng, NP as PCP - General (Family Medicine)  Indicate any recent Medical Services you may have received from other than [facility] providers in the past year (date may be approximate).     Assessment:   This is a routine wellness examination for Mekhi.  Hearing/Vision screen  Hearing Screening   125Hz  250Hz  500Hz  1000Hz  2000Hz  3000Hz  4000Hz  6000Hz  8000Hz   Right ear:           Left ear:           Vision Screening Comments: Gets eyes examined every few years. Has cataracts to bilateral eyes   Dietary issues and exercise activities discussed: Current Exercise Habits: Home exercise routine, Type of exercise: walking, Time (Minutes): 30, Frequency (Times/Week): 5, Weekly Exercise (Minutes/Week): 150, Intensity: Mild  Goals    . Patient Stated     I will continue to walk a mile 1 mile 5 days a week.      Depression Screen PHQ 2/9 Scores 04/16/2020 08/08/2019 08/08/2019 06/30/2015 09/02/2014 08/28/2013 11/28/2012  PHQ - 2 Score 0 0 0 0 1 0 0    Fall Risk Fall Risk  04/16/2020 08/08/2019 08/08/2019 01/15/2019 09/16/2016  Falls in the past year? 0 0 0 0 Yes  Comment - - - [facility] Telephone Survey: data to providers prior to load [employer] Telephone Survey: data to providers prior to load  Number falls in past yr: 0 - - - 1  Comment -  - - - [facility] Telephone Survey Actual Response = 1  Injury with Fall? 0 - - - Yes  Risk for fall due to : No Fall Risks - - - -  Follow up Falls evaluation completed;Falls prevention discussed - - - -    FALL RISK PREVENTION PERTAINING TO THE HOME:  Any stairs in or around the home? No  If so, are there any without handrails? No  Home free of loose throw rugs in walkways, pet beds, electrical cords, etc? Yes  Adequate lighting in your home to reduce risk of falls? Yes   ASSISTIVE DEVICES UTILIZED TO PREVENT FALLS:  Life alert? No  Use of a cane, walker or w/c? No  Grab bars in the bathroom? Yes  Shower chair or bench in shower? No  Elevated toilet seat or a handicapped toilet? Yes   TIMED UP AND GO:  Was the test performed? Yes .  Length of time to ambulate 10 feet: 4 sec.   Gait steady and fast without use of assistive device  Cognitive Function:   Normal cognitive status assessed by direct observation by this Nurse Health Advisor. No abnormalities found.        Immunizations Immunization History  Administered Date(s) Administered  . Fluad Quad(high Dose 65+) 12/29/2018, 01/07/2020  . Influenza Split 11/26/2010, 11/30/2011  . Influenza Whole 12/05/2007, 11/19/2008, 11/10/2009  . Influenza, High Dose Seasonal PF 12/01/2014, 12/11/2015, 12/15/2016, 12/20/2017  . Influenza,inj,Quad PF,6+ Mos 11/28/2012, 11/20/2013  . Influenza,inj,quad, With Preservative 11/10/2016  . PFIZER(Purple Top)SARS-COV-2 Vaccination 03/12/2019, 04/02/2019, 11/22/2019  . Pneumococcal Conjugate-13 02/24/2014  . Pneumococcal Polysaccharide-23 11/26/2010  . Tdap 11/26/2010  . Zoster Recombinat (Shingrix) 04/04/2020    TDAP status: Up to date  Flu Vaccine status: Up to date  Pneumococcal vaccine status: Up to date  Covid-19 vaccine status: Completed vaccines  Qualifies for Shingles Vaccine? Yes   Zostavax completed No   Shingrix Completed?: No.    Education has been provided regarding the  importance of this vaccine. Patient has been advised to call  insurance company to determine out of pocket expense if they have not yet received this vaccine. Advised may also receive vaccine at local pharmacy or Health Dept. Verbalized acceptance and understanding.  Screening Tests Health Maintenance  Topic Date Due  . URINE MICROALBUMIN  07/26/2018  . COVID-19 Vaccine (4 - Booster for Pfizer series) 05/22/2020  . HEMOGLOBIN A1C  08/05/2020  . FOOT EXAM  08/07/2020  . TETANUS/TDAP  11/25/2020  . OPHTHALMOLOGY EXAM  02/10/2021  . INFLUENZA VACCINE  Completed  . PNA vac Low Risk Adult  Completed    Health Maintenance  Health Maintenance Due  Topic Date Due  . URINE MICROALBUMIN  07/26/2018    Colorectal cancer screening: No longer required.   Lung Cancer Screening: (Low Dose CT Chest recommended if Age 81-80 years, 30 pack-year currently smoking OR have quit w/in 15years.) does not qualify.   Lung Cancer Screening Referral: N/A   Additional Screening:  Hepatitis C Screening: does not qualify;   Vision Screening: Recommended annual ophthalmology exams for early detection of glaucoma and other disorders of the eye. Is the patient up to date with their annual eye exam?  Yes  Who is the provider or what is the name of the office in which the patient attends annual eye exams? Dr. Herbert Deaner  If pt is not established with a provider, would they like to be referred to a provider to establish care? No .   Dental Screening: Recommended annual dental exams for proper oral hygiene  Community Resource Referral / Chronic Care Management: CRR required this visit?  No   CCM required this visit?  No      Plan:     I have personally reviewed and noted the following in the patient's chart:   . Medical and social history . Use of alcohol, tobacco or illicit drugs  . Current medications and supplements . Functional ability and status . Nutritional status . Physical activity . Advanced  directives . List of other physicians . Hospitalizations, surgeries, and ER visits in previous 12 months . Vitals . Screenings to include cognitive, depression, and falls . Referrals and appointments  In addition, I have reviewed and discussed with patient certain preventive protocols, quality metrics, and best practice recommendations. A written personalized care plan for preventive services as well as general preventive health recommendations were provided to patient.     Ofilia Neas, LPN   [id number]   Nurse Notes: None

## 2020-04-16 ENCOUNTER — Ambulatory Visit (INDEPENDENT_AMBULATORY_CARE_PROVIDER_SITE_OTHER): Payer: Medicare Other

## 2020-04-16 ENCOUNTER — Other Ambulatory Visit: Payer: Self-pay

## 2020-04-16 VITALS — BP 126/78 | HR 75 | Temp 98.1°F | Ht 71.0 in | Wt 210.1 lb

## 2020-04-16 DIAGNOSIS — Z0000 Encounter for general adult medical examination without abnormal findings: Secondary | ICD-10-CM

## 2020-04-16 NOTE — Patient Instructions (Signed)
Tyler Greer , Thank you for taking time to come for your Medicare Wellness Visit. I appreciate your ongoing commitment to your health goals. Please review the following plan we discussed and let me know if I can assist you in the future.   Screening recommendations/referrals: Colonoscopy: No longer required Recommended yearly ophthalmology/optometry visit for glaucoma screening and checkup Recommended yearly dental visit for hygiene and checkup  Vaccinations:Influenza vaccine: Up to date, next due fall 2022  Pneumococcal vaccine: Completed series Tdap vaccine: Up to date, next due 11/25/2020 Shingles vaccine: Up to date, next due 06/02/2020-08/02/2020  Advanced directives: Advance directive discussed with you today. Even though you declined this today please call our office should you change your mind and we can give you the proper paperwork for you to fill out.   Conditions/risks identified: None   Next appointment: None  Preventive Care 65 Years and Older, Male Preventive care refers to lifestyle choices and visits with your health care provider that can promote health and wellness. What does preventive care include?  A yearly physical exam. This is also called an annual well check.  Dental exams once or twice a year.  Routine eye exams. Ask your health care provider how often you should have your eyes checked.  Personal lifestyle choices, including:  Daily care of your teeth and gums.  Regular physical activity.  Eating a healthy diet.  Avoiding tobacco and drug use.  Limiting alcohol use.  Practicing safe sex.  Taking low doses of aspirin every day.  Taking vitamin and mineral supplements as recommended by your health care provider. What happens during an annual well check? The services and screenings done by your health care provider during your annual well check will depend on your age, overall health, lifestyle risk factors, and family history of  disease. Counseling  Your health care provider may ask you questions about your:  Alcohol use.  Tobacco use.  Drug use.  Emotional well-being.  Home and relationship well-being.  Sexual activity.  Eating habits.  History of falls.  Memory and ability to understand (cognition).  Work and work Statistician. Screening  You may have the following tests or measurements:  Height, weight, and BMI.  Blood pressure.  Lipid and cholesterol levels. These may be checked every 5 years, or more frequently if you are over 63 years old.  Skin check.  Lung cancer screening. You may have this screening every year starting at age 65 if you have a 30-pack-year history of smoking and currently smoke or have quit within the past 15 years.  Fecal occult blood test (FOBT) of the stool. You may have this test every year starting at age 53.  Flexible sigmoidoscopy or colonoscopy. You may have a sigmoidoscopy every 5 years or a colonoscopy every 10 years starting at age 62.  Prostate cancer screening. Recommendations will vary depending on your family history and other risks.  Hepatitis C blood test.  Hepatitis B blood test.  Sexually transmitted disease (STD) testing.  Diabetes screening. This is done by checking your blood sugar (glucose) after you have not eaten for a while (fasting). You may have this done every 1-3 years.  Abdominal aortic aneurysm (AAA) screening. You may need this if you are a current or former smoker.  Osteoporosis. You may be screened starting at age 16 if you are at high risk. Talk with your health care provider about your test results, treatment options, and if necessary, the need for more tests. Vaccines  Your health  care provider may recommend certain vaccines, such as:  Influenza vaccine. This is recommended every year.  Tetanus, diphtheria, and acellular pertussis (Tdap, Td) vaccine. You may need a Td booster every 10 years.  Zoster vaccine. You may  need this after age 8.  Pneumococcal 13-valent conjugate (PCV13) vaccine. One dose is recommended after age 66.  Pneumococcal polysaccharide (PPSV23) vaccine. One dose is recommended after age 40. Talk to your health care provider about which screenings and vaccines you need and how often you need them. This information is not intended to replace advice given to you by your health care provider. Make sure you discuss any questions you have with your health care provider. Document Released: 03/06/2015 Document Revised: 10/28/2015 Document Reviewed: 12/09/2014 Elsevier Interactive Patient Education  2017 [place] Prevention in the Home Falls can cause injuries. They can happen to people of all ages. There are many things you can do to make your home safe and to help prevent falls. What can I do on the outside of my home?  Regularly fix the edges of walkways and driveways and fix any cracks.  Remove anything that might make you trip as you walk through a door, such as a raised step or threshold.  Trim any bushes or trees on the path to your home.  Use bright outdoor lighting.  Clear any walking paths of anything that might make someone trip, such as rocks or tools.  Regularly check to see if handrails are loose or broken. Make sure that both sides of any steps have handrails.  Any raised decks and porches should have guardrails on the edges.  Have any leaves, snow, or ice cleared regularly.  Use sand or salt on walking paths during winter.  Clean up any spills in your garage right away. This includes oil or grease spills. What can I do in the bathroom?  Use night lights.  Install grab bars by the toilet and in the tub and shower. Do not use towel bars as grab bars.  Use non-skid mats or decals in the tub or shower.  If you need to sit down in the shower, use a plastic, non-slip stool.  Keep the floor dry. Clean up any water that spills on the floor as soon as it  happens.  Remove soap buildup in the tub or shower regularly.  Attach bath mats securely with double-sided non-slip rug tape.  Do not have throw rugs and other things on the floor that can make you trip. What can I do in the bedroom?  Use night lights.  Make sure that you have a light by your bed that is easy to reach.  Do not use any sheets or blankets that are too big for your bed. They should not hang down onto the floor.  Have a firm chair that has side arms. You can use this for support while you get dressed.  Do not have throw rugs and other things on the floor that can make you trip. What can I do in the kitchen?  Clean up any spills right away.  Avoid walking on wet floors.  Keep items that you use a lot in easy-to-reach places.  If you need to reach something above you, use a strong step stool that has a grab bar.  Keep electrical cords out of the way.  Do not use floor polish or wax that makes floors slippery. If you must use wax, use non-skid floor wax.  Do not have  throw rugs and other things on the floor that can make you trip. What can I do with my stairs?  Do not leave any items on the stairs.  Make sure that there are handrails on both sides of the stairs and use them. Fix handrails that are broken or loose. Make sure that handrails are as long as the stairways.  Check any carpeting to make sure that it is firmly attached to the stairs. Fix any carpet that is loose or worn.  Avoid having throw rugs at the top or bottom of the stairs. If you do have throw rugs, attach them to the floor with carpet tape.  Make sure that you have a light switch at the top of the stairs and the bottom of the stairs. If you do not have them, ask someone to add them for you. What else can I do to help prevent falls?  Wear shoes that:  Do not have high heels.  Have rubber bottoms.  Are comfortable and fit you well.  Are closed at the toe. Do not wear sandals.  If you  use a stepladder:  Make sure that it is fully opened. Do not climb a closed stepladder.  Make sure that both sides of the stepladder are locked into place.  Ask someone to hold it for you, if possible.  Clearly mark and make sure that you can see:  Any grab bars or handrails.  First and last steps.  Where the edge of each step is.  Use tools that help you move around (mobility aids) if they are needed. These include:  Canes.  Walkers.  Scooters.  Crutches.  Turn on the lights when you go into a dark area. Replace any light bulbs as soon as they burn out.  Set up your furniture so you have a clear path. Avoid moving your furniture around.  If any of your floors are uneven, fix them.  If there are any pets around you, be aware of where they are.  Review your medicines with your doctor. Some medicines can make you feel dizzy. This can increase your chance of falling. Ask your doctor what other things that you can do to help prevent falls. This information is not intended to replace advice given to you by your health care provider. Make sure you discuss any questions you have with your health care provider. Document Released: 12/04/2008 Document Revised: 07/16/2015 Document Reviewed: 03/14/2014 Elsevier Interactive Patient Education  2017 [employer].

## 2020-04-29 DIAGNOSIS — L282 Other prurigo: Secondary | ICD-10-CM | POA: Diagnosis not present

## 2020-04-29 DIAGNOSIS — L300 Nummular dermatitis: Secondary | ICD-10-CM | POA: Diagnosis not present

## 2020-04-29 DIAGNOSIS — Z85828 Personal history of other malignant neoplasm of skin: Secondary | ICD-10-CM | POA: Diagnosis not present

## 2020-06-09 DIAGNOSIS — H2513 Age-related nuclear cataract, bilateral: Secondary | ICD-10-CM | POA: Diagnosis not present

## 2020-06-09 DIAGNOSIS — H35363 Drusen (degenerative) of macula, bilateral: Secondary | ICD-10-CM | POA: Diagnosis not present

## 2020-06-09 DIAGNOSIS — E119 Type 2 diabetes mellitus without complications: Secondary | ICD-10-CM | POA: Diagnosis not present

## 2020-06-09 DIAGNOSIS — H25013 Cortical age-related cataract, bilateral: Secondary | ICD-10-CM | POA: Diagnosis not present

## 2020-06-09 DIAGNOSIS — H524 Presbyopia: Secondary | ICD-10-CM | POA: Diagnosis not present

## 2020-06-09 LAB — HM DIABETES EYE EXAM

## 2020-06-12 ENCOUNTER — Encounter: Payer: Self-pay | Admitting: Adult Health

## 2020-07-07 ENCOUNTER — Other Ambulatory Visit: Payer: Self-pay

## 2020-07-07 ENCOUNTER — Ambulatory Visit (INDEPENDENT_AMBULATORY_CARE_PROVIDER_SITE_OTHER): Payer: Medicare Other | Admitting: Adult Health

## 2020-07-07 ENCOUNTER — Encounter: Payer: Self-pay | Admitting: Adult Health

## 2020-07-07 VITALS — BP 130/80 | Temp 97.5°F | Ht 71.0 in | Wt 214.2 lb

## 2020-07-07 DIAGNOSIS — R079 Chest pain, unspecified: Secondary | ICD-10-CM

## 2020-07-07 NOTE — Progress Notes (Signed)
Subjective:    Patient ID: Tyler Greer, male    DOB: 02-22-1935, 85 y.o.   MRN: [id number]  HPI 85 year old male who  has a past medical history of Arthritis, Basal cell carcinoma, arm, Basal cell carcinoma, face, Diabetes mellitus type II (10/2007), Intention tremor, Migraine, Osteoarthritis, Paresthesia of foot, and Stroke ([place]) (2005).  He presents to the office today for right sided chest pain. He reports that three days ago when he was sitting on the cough he went to get up and had a sudden right sided chest pain. This pain was felt as as though " someone was stabbing me in the chest". Pain lasted a few seconds and then was gone. He has not experienced anything since  Does have a history of GERD and Hyperlipidemia   He denies shortness of breath    Review of Systems See HPI   Past Medical History:  Diagnosis Date  . Arthritis    "hands and knees" (06/10/2015)  . Basal cell carcinoma, arm    "both"  . Basal cell carcinoma, face   . Diabetes mellitus type II 10/2007  . Intention tremor   . Migraine    "none in years" (06/10/2015)  . Osteoarthritis   . Paresthesia of foot    bilateral  . Stroke ([place]) 2005   mini stroke    Social History   Socioeconomic History  . Marital status: Widowed    Spouse name: Not on file  . Number of children: Not on file  . Years of education: Not on file  . Highest education level: Not on file  Occupational History  . Not on file  Tobacco Use  . Smoking status: Never Smoker  . Smokeless tobacco: Never Used  . Tobacco comment: "might have smoked 1 pack of cigarettes in my life"  Substance and Sexual Activity  . Alcohol use: No  . Drug use: No  . Sexual activity: Not Currently  Other Topics Concern  . Not on file  Social History Narrative   Retired Clinical biochemist    Social Determinants of Health   Financial Resource Strain: [place]   . Difficulty of Paying Living Expenses: Not hard at all  Food Insecurity: No Food  Insecurity  . Worried About Charity fundraiser in the Last Year: Never true  . Ran Out of Food in the Last Year: Never true  Transportation Needs: No Transportation Needs  . Lack of Transportation (Medical): No  . Lack of Transportation (Non-Medical): No  Physical Activity: Sufficiently Active  . Days of Exercise per Week: 5 days  . Minutes of Exercise per Session: 30 min  Stress: No Stress Concern Present  . Feeling of Stress : Not at all  Social Connections: Moderately Isolated  . Frequency of Communication with Friends and Family: More than three times a week  . Frequency of Social Gatherings with Friends and Family: More than three times a week  . Attends Religious Services: 1 to 4 times per year  . Active Member of Clubs or Organizations: No  . Attends Archivist Meetings: Never  . Marital Status: Widowed  Intimate Partner Violence: Not At Risk  . Fear of Current or Ex-Partner: No  . Emotionally Abused: No  . Physically Abused: No  . Sexually Abused: No    Past Surgical History:  Procedure Laterality Date  . BACK SURGERY    . HAMMER TOE SURGERY Bilateral   . JOINT REPLACEMENT    .  KNEE ARTHROPLASTY Left 06/10/2015   Procedure: COMPUTER ASSISTED TOTAL KNEE ARTHROPLASTY;  Surgeon: Marybelle Killings, MD;  Location: [facility];  Service: Orthopedics;  Laterality: Left;  . KNEE ARTHROSCOPY Bilateral 2002  . LUMBAR LAMINECTOMY  X 2  . TONSILLECTOMY    . TOTAL KNEE ARTHROPLASTY Left 06/10/2015    Family History  Problem Relation Age of Onset  . Heart disease Father        died age 35-MI  . Diabetes type II Brother   . Melanoma Brother     No Known Allergies  Current Outpatient Medications on File Prior to Visit  Medication Sig Dispense Refill  . metFORMIN (GLUCOPHAGE-XR) 500 MG 24 hr tablet TAKE ONE TABLET DAILY WITH BREAKFAST 180 tablet 0  . Multiple Vitamins-Minerals (ZINC PO) Take by mouth.    . rosuvastatin (CRESTOR) 20 MG tablet Take 1 tablet (20 mg total) by  mouth daily. 90 tablet 1   No current facility-administered medications on file prior to visit.    BP 130/80   Temp (!) 97.5 F (36.4 C) (Oral)   Ht 5\' 11"  (1.803 m)   Wt 214 lb 4 oz (97.2 kg)   BMI 29.88 kg/m       Objective:   Physical Exam Vitals and nursing note reviewed.  Constitutional:      Appearance: Normal appearance. He is well-developed.  Cardiovascular:     Rate and Rhythm: Normal rate and regular rhythm.     Pulses: Normal pulses.     Heart sounds: Normal heart sounds.  Pulmonary:     Effort: Pulmonary effort is normal.     Breath sounds: Normal breath sounds.  Musculoskeletal:        General: Normal range of motion.  Skin:    General: Skin is warm and dry.     Capillary Refill: Capillary refill takes less than 2 seconds.  Neurological:     General: No focal deficit present.     Mental Status: He is alert and oriented to person, place, and time.  Psychiatric:        Mood and Affect: Mood normal.        Behavior: Behavior normal.        Thought Content: Thought content normal.        Judgment: Judgment normal.        Assessment & Plan:  1. Right-sided chest pain  - EKG 12-Lead- Sinus  Rhythm  -Right bundle branch block with left axis -bifascicular block. Rate 68. Consistent with previous EKGS - Explained causes of right sided chest pain. He does not want to do any labs or xrays today  - Likely MSK in origin  - Follow up as needed   Dorothyann Peng, NP

## 2020-07-21 ENCOUNTER — Telehealth (INDEPENDENT_AMBULATORY_CARE_PROVIDER_SITE_OTHER): Payer: Medicare Other | Admitting: Adult Health

## 2020-07-21 ENCOUNTER — Encounter: Payer: Self-pay | Admitting: Adult Health

## 2020-07-21 ENCOUNTER — Other Ambulatory Visit: Payer: Self-pay

## 2020-07-21 VITALS — Ht 71.0 in | Wt 214.0 lb

## 2020-07-21 DIAGNOSIS — M1712 Unilateral primary osteoarthritis, left knee: Secondary | ICD-10-CM

## 2020-07-21 DIAGNOSIS — B349 Viral infection, unspecified: Secondary | ICD-10-CM

## 2020-07-21 NOTE — Progress Notes (Signed)
Virtual Visit via Telephone Note  I connected with Tyler Greer on 07/21/20 at 10:30 AM EDT by telephone and verified that I am speaking with the correct person using two identifiers.   I discussed the limitations, risks, security and privacy concerns of performing an evaluation and management service by telephone and the availability of in person appointments. I also discussed with the patient that there may be a patient responsible charge related to this service. The patient expressed understanding and agreed to proceed.  Location patient: home Location provider: work or home office Participants present for the call: patient, provider Patient did not have a visit in the prior 7 days to address this/these issue(s).   History of Present Illness: 85 year old male who  has a past medical history of Arthritis, Basal cell carcinoma, arm, Basal cell carcinoma, face, Diabetes mellitus type II (10/2007), Intention tremor, Migraine, Osteoarthritis, Paresthesia of foot, and Stroke ([place]) (2005).   He is being evaluated today for an acute issue.  His symptoms started roughly 10 days ago.  Symptoms included nonproductive cough, hoarse voice, rhinorrhea, and sinus congestion.  He did pick up of family member from the train station about 7 days before his symptoms developed, that the member ended up testing positive for COVID.  He took a COVID test and it was negative.  At home he has been taking extra strength Tylenol and his symptoms have resolved.  Additionally, he has chronic left knee pain, in the past we have offered to refer him to orthopedics but he has refused.  Today he reports that the pain is getting worse and he believes it is finally time for him to be seen by orthopedic    Observations/Objective: Patient sounds cheerful and well on the phone. I do not appreciate any SOB. Speech and thought processing are grossly intact. Patient reported vitals:  Assessment and Plan: 1. Viral  infection - Glad that his symptoms have resolved - Follow up as needed  2. Primary osteoarthritis of left knee  - AMB referral to orthopedics   Follow Up Instructions:  I did not refer this patient for an OV in the next 24 hours for this/these issue(s).  I discussed the assessment and treatment plan with the patient. The patient was provided an opportunity to ask questions and all were answered. The patient agreed with the plan and demonstrated an understanding of the instructions.   The patient was advised to call back or seek an in-person evaluation if the symptoms worsen or if the condition fails to improve as anticipated.  I provided 13 minutes of non-face-to-face time during this encounter.   Dorothyann Peng, NP

## 2020-08-22 ENCOUNTER — Other Ambulatory Visit: Payer: Self-pay | Admitting: Adult Health

## 2020-08-22 DIAGNOSIS — E785 Hyperlipidemia, unspecified: Secondary | ICD-10-CM

## 2020-09-10 DIAGNOSIS — M25562 Pain in left knee: Secondary | ICD-10-CM | POA: Diagnosis not present

## 2020-09-10 DIAGNOSIS — Z96652 Presence of left artificial knee joint: Secondary | ICD-10-CM | POA: Diagnosis not present

## 2020-09-12 DIAGNOSIS — M25562 Pain in left knee: Secondary | ICD-10-CM | POA: Insufficient documentation

## 2020-09-23 DIAGNOSIS — Z85828 Personal history of other malignant neoplasm of skin: Secondary | ICD-10-CM | POA: Diagnosis not present

## 2020-09-23 DIAGNOSIS — L570 Actinic keratosis: Secondary | ICD-10-CM | POA: Diagnosis not present

## 2020-09-23 DIAGNOSIS — D692 Other nonthrombocytopenic purpura: Secondary | ICD-10-CM | POA: Diagnosis not present

## 2020-09-23 DIAGNOSIS — L300 Nummular dermatitis: Secondary | ICD-10-CM | POA: Diagnosis not present

## 2020-09-23 DIAGNOSIS — L821 Other seborrheic keratosis: Secondary | ICD-10-CM | POA: Diagnosis not present

## 2020-09-23 DIAGNOSIS — B078 Other viral warts: Secondary | ICD-10-CM | POA: Diagnosis not present

## 2020-09-23 DIAGNOSIS — L910 Hypertrophic scar: Secondary | ICD-10-CM | POA: Diagnosis not present

## 2020-10-16 DIAGNOSIS — M25561 Pain in right knee: Secondary | ICD-10-CM | POA: Diagnosis not present

## 2020-10-16 DIAGNOSIS — M1711 Unilateral primary osteoarthritis, right knee: Secondary | ICD-10-CM | POA: Diagnosis not present

## 2020-10-16 DIAGNOSIS — M25461 Effusion, right knee: Secondary | ICD-10-CM | POA: Diagnosis not present

## 2020-12-25 ENCOUNTER — Other Ambulatory Visit: Payer: Self-pay

## 2020-12-25 ENCOUNTER — Ambulatory Visit (INDEPENDENT_AMBULATORY_CARE_PROVIDER_SITE_OTHER): Payer: Medicare Other | Admitting: *Deleted

## 2020-12-25 DIAGNOSIS — Z23 Encounter for immunization: Secondary | ICD-10-CM | POA: Diagnosis not present

## 2021-01-21 ENCOUNTER — Encounter: Payer: Self-pay | Admitting: Adult Health

## 2021-01-21 ENCOUNTER — Ambulatory Visit (INDEPENDENT_AMBULATORY_CARE_PROVIDER_SITE_OTHER): Payer: Medicare Other | Admitting: Adult Health

## 2021-01-21 ENCOUNTER — Other Ambulatory Visit: Payer: Self-pay

## 2021-01-21 ENCOUNTER — Ambulatory Visit (INDEPENDENT_AMBULATORY_CARE_PROVIDER_SITE_OTHER): Payer: Medicare Other

## 2021-01-21 VITALS — BP 120/80 | HR 74 | Temp 97.8°F | Ht 71.0 in | Wt 208.0 lb

## 2021-01-21 DIAGNOSIS — E0840 Diabetes mellitus due to underlying condition with diabetic neuropathy, unspecified: Secondary | ICD-10-CM

## 2021-01-21 DIAGNOSIS — E785 Hyperlipidemia, unspecified: Secondary | ICD-10-CM

## 2021-01-21 DIAGNOSIS — R079 Chest pain, unspecified: Secondary | ICD-10-CM

## 2021-01-21 MED ORDER — METFORMIN HCL ER 500 MG PO TB24: ORAL_TABLET | ORAL | 1 refills | Status: DC

## 2021-01-21 NOTE — Progress Notes (Signed)
Subjective:    Patient ID: Tyler Greer, male    DOB: 05-13-1935, 85 y.o.   MRN: [id number]  HPI 85 year old male who  has a past medical history of Arthritis, Basal cell carcinoma, arm, Basal cell carcinoma, face, Diabetes mellitus type II (10/2007), Intention tremor, Migraine, Osteoarthritis, Paresthesia of foot, and Stroke ([place]) (2005).  He presents to the office today for an acute issue of left-sided chest pain.  He reports that approximately 4 days ago he had sharp stabbing left-sided chest pain that radiated down into his abdomen.  This lasted for about 20 minutes.  Was not doing any strenuous activity at the time.  After 20 minutes the pain resolved and he had no other symptoms until 2 days later when he was up walking around and he had 2 occurrences of milder pain that is still described as sharp stabbing.  This pain was fast on fast off.  He has not had any further symptoms since.  He does report associated shortness of breath during the first incidence but has not had any shortness of breath since.  He has been able to complete his activities of daily living and is quite active.  Does have a history of well-controlled diabetes which he takes metformin 500 mg daily and hyperlipidemia that he takes Lipitor 20 mg for.  Lab Results  Component Value Date   CHOL 227 (H) 02/05/2020   HDL 48 02/05/2020   LDLCALC 148 (H) 02/05/2020   LDLDIRECT 121.0 08/08/2019   TRIG 172 (H) 02/05/2020   CHOLHDL 4.7 02/05/2020    Review of Systems See HPI   Past Medical History:  Diagnosis Date   Arthritis    "hands and knees" (06/10/2015)   Basal cell carcinoma, arm    "both"   Basal cell carcinoma, face    Diabetes mellitus type II 10/2007   Intention tremor    Migraine    "none in years" (06/10/2015)   Osteoarthritis    Paresthesia of foot    bilateral   Stroke ([place]) 2005   mini stroke    Social History   Socioeconomic History   Marital status: Widowed    Spouse name: Not on file    Number of children: Not on file   Years of education: Not on file   Highest education level: Not on file  Occupational History   Not on file  Tobacco Use   Smoking status: Never   Smokeless tobacco: Never   Tobacco comments:    "might have smoked 1 pack of cigarettes in my life"  Substance and Sexual Activity   Alcohol use: No   Drug use: No   Sexual activity: Not Currently  Other Topics Concern   Not on file  Social History Narrative   Retired Clinical biochemist    Social Determinants of Radio broadcast assistant Strain: Low Risk    Difficulty of Paying Living Expenses: Not hard at all  Food Insecurity: No Food Insecurity   Worried About Charity fundraiser in the Last Year: Never true   Arboriculturist in the Last Year: Never true  Transportation Needs: No Transportation Needs   Lack of Transportation (Medical): No   Lack of Transportation (Non-Medical): No  Physical Activity: Sufficiently Active   Days of Exercise per Week: 5 days   Minutes of Exercise per Session: 30 min  Stress: No Stress Concern Present   Feeling of Stress : Not at all  Social Connections:  Moderately Isolated   Frequency of Communication with Friends and Family: More than three times a week   Frequency of Social Gatherings with Friends and Family: More than three times a week   Attends Religious Services: 1 to 4 times per year   Active Member of [employer] or Organizations: No   Attends Archivist Meetings: Never   Marital Status: Widowed  Human resources officer Violence: Not At Risk   Fear of Current or Ex-Partner: No   Emotionally Abused: No   Physically Abused: No   Sexually Abused: No    Past Surgical History:  Procedure Laterality Date   BACK SURGERY     HAMMER TOE SURGERY Bilateral    JOINT REPLACEMENT     KNEE ARTHROPLASTY Left 06/10/2015   Procedure: COMPUTER ASSISTED TOTAL KNEE ARTHROPLASTY;  Surgeon: Marybelle Killings, MD;  Location: [facility];  Service: Orthopedics;  Laterality: Left;    KNEE ARTHROSCOPY Bilateral 2002   LUMBAR LAMINECTOMY  X 2   TONSILLECTOMY     TOTAL KNEE ARTHROPLASTY Left 06/10/2015    Family History  Problem Relation Age of Onset   Heart disease Father        died age 64-MI   Diabetes type II Brother    Melanoma Brother     No Known Allergies  Current Outpatient Medications on File Prior to Visit  Medication Sig Dispense Refill   metFORMIN (GLUCOPHAGE-XR) 500 MG 24 hr tablet TAKE ONE TABLET DAILY WITH BREAKFAST 180 tablet 0   Multiple Vitamins-Minerals (ZINC PO) Take by mouth.     neomycin-polymyxin-dexameth (MAXITROL) 0.1 % OINT neomycin 3.5 mg/g-polymyxin B 10,000 unit/g-dexameth 0.1 % eye oint     rosuvastatin (CRESTOR) 20 MG tablet Take 1 tablet (20 mg total) by mouth daily. 90 tablet 1   No current facility-administered medications on file prior to visit.    BP 120/80   Pulse 74   Temp 97.8 F (36.6 C) (Oral)   Ht 5\' 11"  (1.803 m)   Wt 208 lb (94.3 kg)   SpO2 99%   BMI 29.01 kg/m       Objective:   Physical Exam Vitals and nursing note reviewed.  Constitutional:      Appearance: Normal appearance. He is well-developed.  Cardiovascular:     Rate and Rhythm: Normal rate and regular rhythm.     Pulses: Normal pulses.     Heart sounds: Normal heart sounds.  Pulmonary:     Effort: Pulmonary effort is normal.     Breath sounds: Normal breath sounds.  Musculoskeletal:        General: Normal range of motion.  Skin:    General: Skin is warm and dry.  Neurological:     General: No focal deficit present.     Mental Status: He is alert and oriented to person, place, and time.  Psychiatric:        Mood and Affect: Mood normal.        Behavior: Behavior normal.        Thought Content: Thought content normal.        Judgment: Judgment normal.      Assessment & Plan:  1. Chest pain, unspecified type  - EKG 12-Lead- Sinus  Rhythm  -Right bundle branch block with left axis -bifascicular block. Rate 75 - consistent with  previous EKG's. Unknown cause of his chest pain at this time. Will check labs and chest xray and refer to cardiology for further evaluation.  - CBC with Differential/Platelet;  Future - Comprehensive metabolic panel; Future - Ambulatory referral to Cardiology - DG Chest 2 View; Future  2. Diabetes mellitus due to underlying condition with diabetic neuropathy, without long-term current use of insulin (HCC)  - Hemoglobin A1c; Future  Dorothyann Peng, NP

## 2021-01-21 NOTE — Patient Instructions (Signed)
Your EKG has not changed from previous years  I am going to refer you to cardiology   Will do some blood work and a chest xray today in the office

## 2021-02-01 NOTE — Progress Notes (Signed)
Chief Complaint  Patient presents with   Follow-up    Chest pain     History of Present Illness: 85 yo male with history of arthritis, DM 2, prior TIA who is here today as a new consult, referred by Dorothyann Peng, NP, for the evaluation of chest pain. EKG 01/21/21 in primary care with sinus, RBBB. Chest x-ray 01/21/21 with no disease. He tells me today that he has had no prior heart issues. He began having left arm pain the day of his latest Covid booster shot 6 weeks ago. The pain sometimes migrates over to his left chest wall. Severe pain in his left chest wall several weeks ago with no associated dyspnea or diaphoresis. His left arm is hurting today. It feel sore to touch. He is very active and walks 3 miles every morning.   Primary Care Provider:  Dorothyann Peng, NP   Past Medical History:  Diagnosis Date   Arthritis    "hands and knees" (06/10/2015)   Basal cell carcinoma, arm    "both"   Basal cell carcinoma, face    Chest pain    Diabetes mellitus type II 10/23/2007   Intention tremor    Migraine    "none in years" (06/10/2015)   Osteoarthritis    Paresthesia of foot    bilateral   Stroke ([place]) 02/22/2003   mini stroke    Past Surgical History:  Procedure Laterality Date   HAMMER TOE SURGERY Bilateral    JOINT REPLACEMENT     KNEE ARTHROPLASTY Left 06/10/2015   Procedure: COMPUTER ASSISTED TOTAL KNEE ARTHROPLASTY;  Surgeon: Marybelle Killings, MD;  Location: [facility];  Service: Orthopedics;  Laterality: Left;   KNEE ARTHROSCOPY Bilateral 02/22/2000   LUMBAR LAMINECTOMY  X 2   TONSILLECTOMY      Current Outpatient Medications  Medication Sig Dispense Refill   metFORMIN (GLUCOPHAGE-XR) 500 MG 24 hr tablet TAKE ONE TABLET DAILY WITH BREAKFAST 180 tablet 1   Multiple Vitamins-Minerals (ZINC PO) Take by mouth. (Patient not taking: Reported on 02/02/2021)     neomycin-polymyxin-dexameth (MAXITROL) 0.1 % OINT neomycin 3.5 mg/g-polymyxin B 10,000 unit/g-dexameth 0.1 % eye oint  (Patient not taking: Reported on 02/02/2021)     rosuvastatin (CRESTOR) 20 MG tablet Take 1 tablet (20 mg total) by mouth daily. (Patient not taking: Reported on 02/02/2021) 90 tablet 1   No current facility-administered medications for this visit.    No Known Allergies  Social History   Socioeconomic History   Marital status: Widowed    Spouse name: Not on file   Number of children: 2   Years of education: Not on file   Highest education level: Not on file  Occupational History   Occupation: Retired-Contractor  Tobacco Use   Smoking status: Never   Smokeless tobacco: Never   Tobacco comments:    "might have smoked 1 pack of cigarettes in my life"  Substance and Sexual Activity   Alcohol use: No   Drug use: No   Sexual activity: Not Currently  Other Topics Concern   Not on file  Social History Narrative   Retired Clinical biochemist    Social Determinants of Radio broadcast assistant Strain: Low Risk    Difficulty of Paying Living Expenses: Not hard at all  Food Insecurity: No Food Insecurity   Worried About Charity fundraiser in the Last Year: Never true   Arboriculturist in the Last Year: Never true  Transportation Needs: No Transportation  Needs   Lack of Transportation (Medical): No   Lack of Transportation (Non-Medical): No  Physical Activity: Sufficiently Active   Days of Exercise per Week: 5 days   Minutes of Exercise per Session: 30 min  Stress: No Stress Concern Present   Feeling of Stress : Not at all  Social Connections: Moderately Isolated   Frequency of Communication with Friends and Family: More than three times a week   Frequency of Social Gatherings with Friends and Family: More than three times a week   Attends Religious Services: 1 to 4 times per year   Active Member of [employer] or Organizations: No   Attends Archivist Meetings: Never   Marital Status: Widowed  Human resources officer Violence: Not At Risk   Fear of Current or Ex-Partner: No    Emotionally Abused: No   Physically Abused: No   Sexually Abused: No    Family History  Problem Relation Age of Onset   Heart disease Father        died age 46-MI   Diabetes type II Brother    Melanoma Brother     Review of Systems:  As stated in the HPI and otherwise negative.   BP (!) 152/86   Pulse 63   Ht 5\' 11"  (1.803 m)   Wt 215 lb 6.4 oz (97.7 kg)   SpO2 96%   BMI 30.04 kg/m   Physical Examination: General: Well developed, well nourished, NAD  HEENT: OP clear, mucus membranes moist  SKIN: warm, dry. No rashes. Neuro: No focal deficits  Musculoskeletal: Muscle strength 5/5 all ext  Psychiatric: Mood and affect normal  Neck: No JVD, no carotid bruits, no thyromegaly, no lymphadenopathy.  Lungs:Clear bilaterally, no wheezes, rhonci, crackles Cardiovascular: Regular rate and rhythm. No murmurs, gallops or rubs. Abdomen:Soft. Bowel sounds present. Non-tender.  Extremities: No lower extremity edema. Pulses are 2 + in the bilateral DP/PT.  EKG:  EKG is not ordered today. The ekg ordered today demonstrates  EKG from 01/21/21 reviewed and shows sinus with RBBB  Recent Labs: 02/05/2020: ALT 13; BUN 27; Creat 1.12; Potassium 4.9; Sodium 138   Lipid Panel    Component Value Date/Time   CHOL 227 (H) 02/05/2020 0923   TRIG 172 (H) 02/05/2020 0923   HDL 48 02/05/2020 0923   CHOLHDL 4.7 02/05/2020 0923   VLDL 62.6 (H) 08/08/2019 1003   LDLCALC 148 (H) 02/05/2020 0923   LDLDIRECT 121.0 08/08/2019 1003     Wt Readings from Last 3 Encounters:  02/02/21 215 lb 6.4 oz (97.7 kg)  01/21/21 208 lb (94.3 kg)  07/21/20 214 lb (97.1 kg)     Assessment and Plan:   1. Chest pain: His pain is atypical and is following his Covid booster shot. Given his age and recurrence of pain over the past few weeks, will arrange an echo to assess LVEF, exclude structural heart disease. Exercise nuclear stress test to exclude ischemia.   Current medicines are reviewed at length with the  patient today.  The patient does not have concerns regarding medicines.  The following changes have been made:  no change  Labs/ tests ordered today include:    Orders Placed This Encounter  Procedures   Cardiac Stress Test: Informed Consent Details: Physician/Practitioner Attestation; Transcribe to consent form and obtain patient signature   Myocardial Perfusion Imaging   ECHOCARDIOGRAM COMPLETE      Disposition:   F/U with me in one year.    Signed, Lauree Chandler, MD 02/02/2021 10:26  AM    [facility] [place], [place], [place]  [address] Phone: [phone]; Fax: [phone]

## 2021-02-02 ENCOUNTER — Encounter: Payer: Self-pay | Admitting: Cardiovascular Disease

## 2021-02-02 ENCOUNTER — Other Ambulatory Visit: Payer: Self-pay

## 2021-02-02 ENCOUNTER — Ambulatory Visit (INDEPENDENT_AMBULATORY_CARE_PROVIDER_SITE_OTHER): Payer: Medicare Other | Admitting: Cardiovascular Disease

## 2021-02-02 ENCOUNTER — Encounter: Payer: Self-pay | Admitting: *Deleted

## 2021-02-02 VITALS — BP 152/86 | HR 63 | Ht 71.0 in | Wt 215.4 lb

## 2021-02-02 DIAGNOSIS — R079 Chest pain, unspecified: Secondary | ICD-10-CM | POA: Diagnosis not present

## 2021-02-02 NOTE — Patient Instructions (Signed)
Medication Instructions:  No changes *If you need a refill on your cardiac medications before your next appointment, please call your pharmacy*   Lab Work: none  Testing/Procedures: Your physician has requested that you have an echocardiogram. Echocardiography is a painless test that uses sound waves to create images of your heart. It provides your doctor with information about the size and shape of your heart and how well your hearts chambers and valves are working. This procedure takes approximately one hour. There are no restrictions for this procedure.  Your physician has requested that you have en exercise stress myoview. For further information please visit [email or web address]. Please follow instruction sheet, as given.   Follow-Up: At [facility], you and your health needs are our priority.  As part of our continuing mission to provide you with exceptional heart care, we have created designated Provider Care Teams.  These Care Teams include your primary Cardiologist (physician) and Advanced Practice Providers (APPs -  Physician Assistants and Nurse Practitioners) who all work together to provide you with the care you need, when you need it.  We recommend signing up for the patient portal called "MyChart".  Sign up information is provided on this After Visit Summary.  MyChart is used to connect with patients for Virtual Visits (Telemedicine).  Patients are able to view lab/test results, encounter notes, upcoming appointments, etc.  Non-urgent messages can be sent to your provider as well.   To learn more about what you can do with MyChart, go to [email or web address].    Your next appointment:   12 month(s)  The format for your next appointment:   In Person  Provider:   Lauree Chandler, MD     Other Instructions

## 2021-02-11 DIAGNOSIS — H04563 Stenosis of bilateral lacrimal punctum: Secondary | ICD-10-CM | POA: Diagnosis not present

## 2021-02-11 DIAGNOSIS — H16213 Exposure keratoconjunctivitis, bilateral: Secondary | ICD-10-CM | POA: Diagnosis not present

## 2021-02-11 DIAGNOSIS — M1990 Unspecified osteoarthritis, unspecified site: Secondary | ICD-10-CM | POA: Insufficient documentation

## 2021-02-11 DIAGNOSIS — H02135 Senile ectropion of left lower eyelid: Secondary | ICD-10-CM | POA: Diagnosis not present

## 2021-02-11 DIAGNOSIS — H02132 Senile ectropion of right lower eyelid: Secondary | ICD-10-CM | POA: Diagnosis not present

## 2021-02-11 DIAGNOSIS — H04223 Epiphora due to insufficient drainage, bilateral lacrimal glands: Secondary | ICD-10-CM | POA: Diagnosis not present

## 2021-02-11 DIAGNOSIS — H04523 Eversion of bilateral lacrimal punctum: Secondary | ICD-10-CM | POA: Diagnosis not present

## 2021-02-18 ENCOUNTER — Telehealth (HOSPITAL_COMMUNITY): Payer: Self-pay | Admitting: *Deleted

## 2021-02-18 NOTE — Telephone Encounter (Signed)
Left message on voicemail in reference to upcoming appointment scheduled for 02/25/2021. Phone number given for a call back so details instructions can be given.

## 2021-02-23 ENCOUNTER — Telehealth (HOSPITAL_COMMUNITY): Payer: Self-pay | Admitting: *Deleted

## 2021-02-23 NOTE — Telephone Encounter (Signed)
Left message on voicemail in reference to upcoming appointment scheduled for 02/26/20 Phone number given for a call back so details instructions can be given.  Tyler Greer

## 2021-02-25 ENCOUNTER — Ambulatory Visit (HOSPITAL_COMMUNITY): Payer: Medicare Other | Attending: Cardiology

## 2021-02-25 ENCOUNTER — Other Ambulatory Visit: Payer: Self-pay

## 2021-02-25 ENCOUNTER — Ambulatory Visit (HOSPITAL_BASED_OUTPATIENT_CLINIC_OR_DEPARTMENT_OTHER): Payer: Medicare Other

## 2021-02-25 DIAGNOSIS — R079 Chest pain, unspecified: Secondary | ICD-10-CM | POA: Insufficient documentation

## 2021-02-25 LAB — MYOCARDIAL PERFUSION IMAGING
Base ST Depression (mm): 0 mm
Estimated workload: 6.4
Exercise duration (min): 4 min
Exercise duration (sec): 30 s
LV dias vol: 34 mL (ref 62–150)
LV sys vol: 4 mL
MPHR: 137 {beats}/min
Nuc Stress EF: 88 %
Peak HR: 118 {beats}/min
Percent HR: 86 %
Rest HR: 78 {beats}/min
Rest Nuclear Isotope Dose: 10.4 mCi
SDS: 2
SRS: 0
SSS: 2
ST Depression (mm): 0 mm
Stress Nuclear Isotope Dose: 32.3 mCi
TID: 0.65

## 2021-02-25 LAB — ECHOCARDIOGRAM COMPLETE
Area-P 1/2: 3.17 cm2
Height: 71 in
S' Lateral: 3 cm
Weight: 3440 oz

## 2021-02-25 MED ORDER — TECHNETIUM TC 99M TETROFOSMIN IV KIT
32.3000 | PACK | Freq: Once | INTRAVENOUS | Status: AC | PRN
  Administered 2021-02-25: 32.3 via INTRAVENOUS
  Filled 2021-02-25: qty 33

## 2021-02-25 MED ORDER — TECHNETIUM TC 99M TETROFOSMIN IV KIT
10.4000 | PACK | Freq: Once | INTRAVENOUS | Status: AC | PRN
  Administered 2021-02-25: 10.4 via INTRAVENOUS
  Filled 2021-02-25: qty 11

## 2021-02-25 MED ORDER — PERFLUTREN LIPID MICROSPHERE
1.0000 mL | INTRAVENOUS | Status: AC | PRN
  Administered 2021-02-25: 2 mL via INTRAVENOUS

## 2021-03-10 DIAGNOSIS — H04562 Stenosis of left lacrimal punctum: Secondary | ICD-10-CM | POA: Diagnosis not present

## 2021-03-10 DIAGNOSIS — H16212 Exposure keratoconjunctivitis, left eye: Secondary | ICD-10-CM | POA: Diagnosis not present

## 2021-03-10 DIAGNOSIS — H02535 Eyelid retraction left lower eyelid: Secondary | ICD-10-CM | POA: Diagnosis not present

## 2021-03-10 DIAGNOSIS — H02135 Senile ectropion of left lower eyelid: Secondary | ICD-10-CM | POA: Diagnosis not present

## 2021-03-10 DIAGNOSIS — H04222 Epiphora due to insufficient drainage, left lacrimal gland: Secondary | ICD-10-CM | POA: Diagnosis not present

## 2021-03-10 DIAGNOSIS — H04522 Eversion of left lacrimal punctum: Secondary | ICD-10-CM | POA: Diagnosis not present

## 2021-04-19 ENCOUNTER — Ambulatory Visit (INDEPENDENT_AMBULATORY_CARE_PROVIDER_SITE_OTHER): Payer: Medicare Other

## 2021-04-19 DIAGNOSIS — Z0000 Encounter for general adult medical examination without abnormal findings: Secondary | ICD-10-CM

## 2021-04-19 NOTE — Progress Notes (Signed)
Virtual Visit via Telephone Note  I connected with  Tyler Greer on 04/19/21 at  9:45 AM EST by telephone and verified that I am speaking with the correct person using two identifiers.  Location: Patient: home Provider: [place] participating in the virtual visit: patient/Nurse Health Advisor   I discussed the limitations, risks, security and privacy concerns of performing an evaluation and management service by telephone and the availability of in person appointments. The patient expressed understanding and agreed to proceed.  Interactive audio and video telecommunications were attempted between this nurse and patient, however failed, due to patient having technical difficulties OR patient did not have access to video capability.  We continued and completed visit with audio only.  Some vital signs may be absent or patient reported.   Dionisio David, LPN  Subjective:   Tyler Greer is a 86 y.o. male who presents for Medicare Annual/Subsequent preventive examination.  Review of Systems           Objective:    There were no vitals filed for this visit. There is no height or weight on file to calculate BMI.  Advanced Directives 04/16/2020 09/09/2016 07/01/2015 06/30/2015 06/10/2015 06/01/2015  Does Patient Have a Medical Advance Directive? No No No No No No  Would patient like information on creating a medical advance directive? No - Patient declined - No - patient declined information No - patient declined information No - patient declined information Yes - Educational materials given    Current Medications (verified) Outpatient Encounter Medications as of 04/19/2021  Medication Sig   metFORMIN (GLUCOPHAGE-XR) 500 MG 24 hr tablet TAKE ONE TABLET DAILY WITH BREAKFAST   Multiple Vitamins-Minerals (ZINC PO) Take by mouth. (Patient not taking: Reported on 02/02/2021)   neomycin-polymyxin-dexameth (MAXITROL) 0.1 % OINT neomycin 3.5 mg/g-polymyxin B 10,000 unit/g-dexameth  0.1 % eye oint (Patient not taking: Reported on 02/02/2021)   rosuvastatin (CRESTOR) 20 MG tablet Take 1 tablet (20 mg total) by mouth daily. (Patient not taking: Reported on 02/02/2021)   No facility-administered encounter medications on file as of 04/19/2021.    Allergies (verified) Patient has no known allergies.   History: Past Medical History:  Diagnosis Date   Arthritis    "hands and knees" (06/10/2015)   Basal cell carcinoma, arm    "both"   Basal cell carcinoma, face    Chest pain    Diabetes mellitus type II 10/23/2007   Intention tremor    Migraine    "none in years" (06/10/2015)   Osteoarthritis    Paresthesia of foot    bilateral   Stroke ([place]) 02/22/2003   mini stroke   Past Surgical History:  Procedure Laterality Date   HAMMER TOE SURGERY Bilateral    JOINT REPLACEMENT     KNEE ARTHROPLASTY Left 06/10/2015   Procedure: COMPUTER ASSISTED TOTAL KNEE ARTHROPLASTY;  Surgeon: Marybelle Killings, MD;  Location: [facility];  Service: Orthopedics;  Laterality: Left;   KNEE ARTHROSCOPY Bilateral 02/22/2000   LUMBAR LAMINECTOMY  X 2   TONSILLECTOMY     Family History  Problem Relation Age of Onset   Heart disease Father        died age 24-MI   Diabetes type II Brother    Melanoma Brother    Social History   Socioeconomic History   Marital status: Widowed    Spouse name: Not on file   Number of children: 2   Years of education: Not on file   Highest education level: Not  on file  Occupational History   Occupation: Retired-Contractor  Tobacco Use   Smoking status: Never   Smokeless tobacco: Never   Tobacco comments:    "might have smoked 1 pack of cigarettes in my life"  Substance and Sexual Activity   Alcohol use: No   Drug use: No   Sexual activity: Not Currently  Other Topics Concern   Not on file  Social History Narrative   Retired Clinical biochemist    Social Determinants of Radio broadcast assistant Strain: Low Risk    Difficulty of Paying Living  Expenses: Not hard at all  Food Insecurity: No Food Insecurity   Worried About Charity fundraiser in the Last Year: Never true   Arboriculturist in the Last Year: Never true  Transportation Needs: No Transportation Needs   Lack of Transportation (Medical): No   Lack of Transportation (Non-Medical): No  Physical Activity: Sufficiently Active   Days of Exercise per Week: 5 days   Minutes of Exercise per Session: 30 min  Stress: No Stress Concern Present   Feeling of Stress : Not at all  Social Connections: Moderately Isolated   Frequency of Communication with Friends and Family: More than three times a week   Frequency of Social Gatherings with Friends and Family: More than three times a week   Attends Religious Services: 1 to 4 times per year   Active Member of [employer] or Organizations: No   Attends Archivist Meetings: Never   Marital Status: Widowed    Tobacco Counseling Counseling given: Not Answered Tobacco comments: "might have smoked 1 pack of cigarettes in my life"   Clinical Intake:                 Diabetic?yes Nutrition Risk Assessment:  Has the patient had any N/V/D within the last 2 months?  No  Does the patient have any non-healing wounds?  No  Has the patient had any unintentional weight loss or weight gain?  No   Diabetes:  Is the patient diabetic?  Yes  If diabetic, was a CBG obtained today?  No  Did the patient bring in their glucometer from home?  No  How often do you monitor your CBG's? never.   Financial Strains and Diabetes Management:  Are you having any financial strains with the device, your supplies or your medication? No .  Does the patient want to be seen by Chronic Care Management for management of their diabetes?  No  Would the patient like to be referred to a Nutritionist or for Diabetic Management?  No   Diabetic Exams:  Diabetic Eye Exam: Completed 06/09/20. Pt has been advised about the importance in completing this  exam.   Diabetic Foot Exam: Completed 08/08/19. Pt has been advised about the importance in completing this exam.         Activities of Daily Living No flowsheet data found.  Patient Care Team: Dorothyann Peng, NP as PCP - General (Family Medicine) Burnell Blanks, MD as PCP - Cardiology (Cardiology)  Indicate any recent Medical Services you may have received from other than [facility] providers in the past year (date may be approximate).     Assessment:   This is a routine wellness examination for Tyler Greer.  Hearing/Vision screen No results found.  Dietary issues and exercise activities discussed:     Goals Addressed   None    Depression Screen [facility] 2/9 Scores 01/26/2021 04/16/2020 08/08/2019 08/08/2019 06/30/2015 09/02/2014  08/28/2013  PHQ - 2 Score 3 0 0 0 0 1 0  PHQ- 9 Score 8 - - - - - -    Fall Risk Fall Risk  01/26/2021 04/16/2020 08/08/2019 08/08/2019 01/15/2019  Falls in the past year? 0 0 0 0 0  Comment - - - - Emmi Telephone Survey: data to providers prior to load  Number falls in past yr: - 0 - - -  Comment - - - - -  Injury with Fall? - 0 - - -  Risk for fall due to : - No Fall Risks - - -  Follow up - Falls evaluation completed;Falls prevention discussed - - -    FALL RISK PREVENTION PERTAINING TO THE HOME:  Any stairs in or around the home? Yes  If so, are there any without handrails? No  Home free of loose throw rugs in walkways, pet beds, electrical cords, etc? Yes  Adequate lighting in your home to reduce risk of falls? Yes   ASSISTIVE DEVICES UTILIZED TO PREVENT FALLS:  Life alert? No  Use of a cane, walker or w/c? No  Grab bars in the bathroom? Yes  Shower chair or bench in shower? No  Elevated toilet seat or a handicapped toilet? Yes   Cognitive Function:Normal cognitive status assessed by direct observation by this Nurse Health Advisor. No abnormalities found.          Immunizations Immunization History  Administered Date(s) Administered    Fluad Quad(high Dose 65+) 12/29/2018, 01/07/2020, 12/25/2020   Influenza Split 11/26/2010, 11/30/2011   Influenza Whole 12/05/2007, 11/19/2008, 11/10/2009   Influenza, High Dose Seasonal PF 12/01/2014, 12/11/2015, 12/15/2016, 12/20/2017   Influenza,inj,Quad PF,6+ Mos 11/28/2012, 11/20/2013   Influenza,inj,quad, With Preservative 11/10/2016   PFIZER(Purple Top)SARS-COV-2 Vaccination 03/12/2019, 04/02/2019, 11/22/2019   Pneumococcal Conjugate-13 02/24/2014   Pneumococcal Polysaccharide-23 11/26/2010   Tdap 11/26/2010   Zoster Recombinat (Shingrix) 04/04/2020    TDAP status: Due, Education has been provided regarding the importance of this vaccine. Advised may receive this vaccine at local pharmacy or Health Dept. Aware to provide a copy of the vaccination record if obtained from local pharmacy or Health Dept. Verbalized acceptance and understanding.  Flu Vaccine status: Up to date  Pneumococcal vaccine status: Up to date  Covid-19 vaccine status: Completed vaccines  Qualifies for Shingles Vaccine? Yes   Zostavax completed Yes   Shingrix Completed?: Yes  Screening Tests Health Maintenance  Topic Date Due   URINE MICROALBUMIN  07/26/2018   COVID-19 Vaccine (4 - Booster for Pfizer series) 01/17/2020   Zoster Vaccines- Shingrix (2 of 2) 05/30/2020   HEMOGLOBIN A1C  08/05/2020   FOOT EXAM  08/07/2020   TETANUS/TDAP  11/25/2020   OPHTHALMOLOGY EXAM  06/09/2021   Pneumonia Vaccine 22+ Years old  Completed   INFLUENZA VACCINE  Completed   HPV VACCINES  Aged Out    Health Maintenance  Health Maintenance Due  Topic Date Due   URINE MICROALBUMIN  07/26/2018   COVID-19 Vaccine (4 - Booster for Pfizer series) 01/17/2020   Zoster Vaccines- Shingrix (2 of 2) 05/30/2020   HEMOGLOBIN A1C  08/05/2020   FOOT EXAM  08/07/2020   TETANUS/TDAP  11/25/2020    Colorectal cancer screening: No longer required.   Lung Cancer Screening: (Low Dose CT Chest recommended if Age 52-80 years, 30  pack-year currently smoking OR have quit w/in 15years.) does not qualify.    Additional Screening:  Hepatitis C Screening: does qualify; Completed no  Vision Screening: Recommended annual ophthalmology  exams for early detection of glaucoma and other disorders of the eye. Is the patient up to date with their annual eye exam?  Yes  Who is the provider or what is the name of the office in which the patient attends annual eye exams? [facility] If pt is not established with a provider, would they like to be referred to a provider to establish care? No .   Dental Screening: Recommended annual dental exams for proper oral hygiene  Community Resource Referral / Chronic Care Management: CRR required this visit?  No   CCM required this visit?  No      Plan:     I have personally reviewed and noted the following in the patients chart:   Medical and social history Use of alcohol, tobacco or illicit drugs  Current medications and supplements including opioid prescriptions. Patient is not currently taking opioid prescriptions. Functional ability and status Nutritional status Physical activity Advanced directives List of other physicians Hospitalizations, surgeries, and ER visits in previous 12 months Vitals Screenings to include cognitive, depression, and falls Referrals and appointments  In addition, I have reviewed and discussed with patient certain preventive protocols, quality metrics, and best practice recommendations. A written personalized care plan for preventive services as well as general preventive health recommendations were provided to patient.     Dionisio David, LPN   [id number]   Nurse Notes: none

## 2021-04-19 NOTE — Patient Instructions (Signed)
[name] , Thank you for taking time to come for your Medicare Wellness Visit. I appreciate your ongoing commitment to your health goals. Please review the following plan we discussed and let me know if I can assist you in the future.   Screening recommendations/referrals: Colonoscopy: aged out Recommended yearly ophthalmology/optometry visit for glaucoma screening and checkup Recommended yearly dental visit for hygiene and checkup  Vaccinations: Influenza vaccine: 12/25/20 Pneumococcal vaccine: 02/24/14 Tdap vaccine: 11/26/10, due Shingles vaccine: Shingrix 04/04/20, states had second shot   Covid-19: 03/12/19, 04/02/19, 11/22/19  Advanced directives: no  Conditions/risks identified: none  Next appointment: Follow up in one year for your annual wellness visit. - declined  Preventive Care 84 Years and Older, Male Preventive care refers to lifestyle choices and visits with your health care provider that can promote health and wellness. What does preventive care include? A yearly physical exam. This is also called an annual well check. Dental exams once or twice a year. Routine eye exams. Ask your health care provider how often you should have your eyes checked. Personal lifestyle choices, including: Daily care of your teeth and gums. Regular physical activity. Eating a healthy diet. Avoiding tobacco and drug use. Limiting alcohol use. Practicing safe sex. Taking low doses of aspirin every day. Taking vitamin and mineral supplements as recommended by your health care provider. What happens during an annual well check? The services and screenings done by your health care provider during your annual well check will depend on your age, overall health, lifestyle risk factors, and family history of disease. Counseling  Your health care provider may ask you questions about your: Alcohol use. Tobacco use. Drug use. Emotional well-being. Home and relationship well-being. Sexual  activity. Eating habits. History of falls. Memory and ability to understand (cognition). Work and work Statistician. Screening  You may have the following tests or measurements: Height, weight, and BMI. Blood pressure. Lipid and cholesterol levels. These may be checked every 5 years, or more frequently if you are over 38 years old. Skin check. Lung cancer screening. You may have this screening every year starting at age 20 if you have a 30-pack-year history of smoking and currently smoke or have quit within the past 15 years. Fecal occult blood test (FOBT) of the stool. You may have this test every year starting at age 19. Flexible sigmoidoscopy or colonoscopy. You may have a sigmoidoscopy every 5 years or a colonoscopy every 10 years starting at age 80. Prostate cancer screening. Recommendations will vary depending on your family history and other risks. Hepatitis C blood test. Hepatitis B blood test. Sexually transmitted disease (STD) testing. Diabetes screening. This is done by checking your blood sugar (glucose) after you have not eaten for a while (fasting). You may have this done every 1-3 years. Abdominal aortic aneurysm (AAA) screening. You may need this if you are a current or former smoker. Osteoporosis. You may be screened starting at age 82 if you are at high risk. Talk with your health care provider about your test results, treatment options, and if necessary, the need for more tests. Vaccines  Your health care provider may recommend certain vaccines, such as: Influenza vaccine. This is recommended every year. Tetanus, diphtheria, and acellular pertussis (Tdap, Td) vaccine. You may need a Td booster every 10 years. Zoster vaccine. You may need this after age 50. Pneumococcal 13-valent conjugate (PCV13) vaccine. One dose is recommended after age 38. Pneumococcal polysaccharide (PPSV23) vaccine. One dose is recommended after age 32. Talk to  your health care provider about which  screenings and vaccines you need and how often you need them. This information is not intended to replace advice given to you by your health care provider. Make sure you discuss any questions you have with your health care provider. Document Released: 03/06/2015 Document Revised: 10/28/2015 Document Reviewed: 12/09/2014 Elsevier Interactive Patient Education  2017 [place] Prevention in the Home Falls can cause injuries. They can happen to people of all ages. There are many things you can do to make your home safe and to help prevent falls. What can I do on the outside of my home? Regularly fix the edges of walkways and driveways and fix any cracks. Remove anything that might make you trip as you walk through a door, such as a raised step or threshold. Trim any bushes or trees on the path to your home. Use bright outdoor lighting. Clear any walking paths of anything that might make someone trip, such as rocks or tools. Regularly check to see if handrails are loose or broken. Make sure that both sides of any steps have handrails. Any raised decks and porches should have guardrails on the edges. Have any leaves, snow, or ice cleared regularly. Use sand or salt on walking paths during winter. Clean up any spills in your garage right away. This includes oil or grease spills. What can I do in the bathroom? Use night lights. Install grab bars by the toilet and in the tub and shower. Do not use towel bars as grab bars. Use non-skid mats or decals in the tub or shower. If you need to sit down in the shower, use a plastic, non-slip stool. Keep the floor dry. Clean up any water that spills on the floor as soon as it happens. Remove soap buildup in the tub or shower regularly. Attach bath mats securely with double-sided non-slip rug tape. Do not have throw rugs and other things on the floor that can make you trip. What can I do in the bedroom? Use night lights. Make sure that you have a  light by your bed that is easy to reach. Do not use any sheets or blankets that are too big for your bed. They should not hang down onto the floor. Have a firm chair that has side arms. You can use this for support while you get dressed. Do not have throw rugs and other things on the floor that can make you trip. What can I do in the kitchen? Clean up any spills right away. Avoid walking on wet floors. Keep items that you use a lot in easy-to-reach places. If you need to reach something above you, use a strong step stool that has a grab bar. Keep electrical cords out of the way. Do not use floor polish or wax that makes floors slippery. If you must use wax, use non-skid floor wax. Do not have throw rugs and other things on the floor that can make you trip. What can I do with my stairs? Do not leave any items on the stairs. Make sure that there are handrails on both sides of the stairs and use them. Fix handrails that are broken or loose. Make sure that handrails are as long as the stairways. Check any carpeting to make sure that it is firmly attached to the stairs. Fix any carpet that is loose or worn. Avoid having throw rugs at the top or bottom of the stairs. If you do have throw rugs, attach  them to the floor with carpet tape. Make sure that you have a light switch at the top of the stairs and the bottom of the stairs. If you do not have them, ask someone to add them for you. What else can I do to help prevent falls? Wear shoes that: Do not have high heels. Have rubber bottoms. Are comfortable and fit you well. Are closed at the toe. Do not wear sandals. If you use a stepladder: Make sure that it is fully opened. Do not climb a closed stepladder. Make sure that both sides of the stepladder are locked into place. Ask someone to hold it for you, if possible. Clearly mark and make sure that you can see: Any grab bars or handrails. First and last steps. Where the edge of each step  is. Use tools that help you move around (mobility aids) if they are needed. These include: Canes. Walkers. Scooters. Crutches. Turn on the lights when you go into a dark area. Replace any light bulbs as soon as they burn out. Set up your furniture so you have a clear path. Avoid moving your furniture around. If any of your floors are uneven, fix them. If there are any pets around you, be aware of where they are. Review your medicines with your doctor. Some medicines can make you feel dizzy. This can increase your chance of falling. Ask your doctor what other things that you can do to help prevent falls. This information is not intended to replace advice given to you by your health care provider. Make sure you discuss any questions you have with your health care provider. Document Released: 12/04/2008 Document Revised: 07/16/2015 Document Reviewed: 03/14/2014 Elsevier Interactive Patient Education  2017 [employer].

## 2021-04-21 DIAGNOSIS — H16211 Exposure keratoconjunctivitis, right eye: Secondary | ICD-10-CM | POA: Diagnosis not present

## 2021-04-21 DIAGNOSIS — H02131 Senile ectropion of right upper eyelid: Secondary | ICD-10-CM | POA: Diagnosis not present

## 2021-04-21 DIAGNOSIS — H04561 Stenosis of right lacrimal punctum: Secondary | ICD-10-CM | POA: Diagnosis not present

## 2021-04-21 DIAGNOSIS — H04221 Epiphora due to insufficient drainage, right lacrimal gland: Secondary | ICD-10-CM | POA: Diagnosis not present

## 2021-04-21 DIAGNOSIS — H02132 Senile ectropion of right lower eyelid: Secondary | ICD-10-CM | POA: Diagnosis not present

## 2021-04-21 DIAGNOSIS — H04521 Eversion of right lacrimal punctum: Secondary | ICD-10-CM | POA: Diagnosis not present

## 2021-04-21 DIAGNOSIS — H16212 Exposure keratoconjunctivitis, left eye: Secondary | ICD-10-CM | POA: Diagnosis not present

## 2021-05-21 ENCOUNTER — Encounter: Payer: Self-pay | Admitting: Adult Health

## 2021-05-21 ENCOUNTER — Ambulatory Visit (INDEPENDENT_AMBULATORY_CARE_PROVIDER_SITE_OTHER): Payer: Medicare Other | Admitting: Adult Health

## 2021-05-21 VITALS — BP 126/64 | HR 63 | Temp 98.0°F | Ht 71.0 in | Wt 215.0 lb

## 2021-05-21 DIAGNOSIS — K648 Other hemorrhoids: Secondary | ICD-10-CM

## 2021-05-21 DIAGNOSIS — K625 Hemorrhage of anus and rectum: Secondary | ICD-10-CM

## 2021-05-21 MED ORDER — HYDROCORTISONE ACETATE 25 MG RE SUPP: 25.0000 mg | Freq: Two times a day (BID) | RECTAL | 0 refills | Status: DC

## 2021-05-21 NOTE — Progress Notes (Signed)
? ?Subjective:  ? ? Patient ID: Tyler Greer, male    DOB: 12/26/1935, 86 y.o.   MRN: [id number] ? ?HPI ? ?86 year old male who  has a past medical history of Arthritis, Basal cell carcinoma, arm, Basal cell carcinoma, face, Chest pain, Diabetes mellitus type II (10/23/2007), Intention tremor, Migraine, Osteoarthritis, Paresthesia of foot, and Stroke ([place]) (02/22/2003). ? ?He presents to the office today for concern of bright red blood on the toilet paper. He did not see any blood in his stool or the toilet. He reports 5days ago he had a single bowel movement and the next day he had another bowel movement and again noticed bright red blood on the toilet paper. The next day he had some generalized lower abdominal pain that resolved the same day but did notice any additional blood. He has not had any abdominal pain or bleeding over the last two days.  ? ?He has not had any SOB or CP. He has felt fine otherwise  ? ?Denies any rectal burning or itching  ? ? ?Review of Systems ?See HPI  ? ?Past Medical History:  ?Diagnosis Date  ? Arthritis   ? "hands and knees" (06/10/2015)  ? Basal cell carcinoma, arm   ? "both"  ? Basal cell carcinoma, face   ? Chest pain   ? Diabetes mellitus type II 10/23/2007  ? Intention tremor   ? Migraine   ? "none in years" (06/10/2015)  ? Osteoarthritis   ? Paresthesia of foot   ? bilateral  ? Stroke ([place]) 02/22/2003  ? mini stroke  ? ? ?Social History  ? ?Socioeconomic History  ? Marital status: Widowed  ?  Spouse name: Not on file  ? Number of children: 2  ? Years of education: Not on file  ? Highest education level: Not on file  ?Occupational History  ? Occupation: Retired-Contractor  ?Tobacco Use  ? Smoking status: Never  ? Smokeless tobacco: Never  ? Tobacco comments:  ?  "might have smoked 1 pack of cigarettes in my life"  ?Substance and Sexual Activity  ? Alcohol use: No  ? Drug use: No  ? Sexual activity: Not Currently  ?Other Topics Concern  ? Not on file  ?Social History Narrative   ? Retired Clinical biochemist   ? ?Social Determinants of Health  ? ?Financial Resource Strain: Low Risk   ? Difficulty of Paying Living Expenses: Not hard at all  ?Food Insecurity: No Food Insecurity  ? Worried About Charity fundraiser in the Last Year: Never true  ? Ran Out of Food in the Last Year: Never true  ?Transportation Needs: No Transportation Needs  ? Lack of Transportation (Medical): No  ? Lack of Transportation (Non-Medical): No  ?Physical Activity: Sufficiently Active  ? Days of Exercise per Week: 5 days  ? Minutes of Exercise per Session: 40 min  ?Stress: No Stress Concern Present  ? Feeling of Stress : Not at all  ?Social Connections: Moderately Isolated  ? Frequency of Communication with Friends and Family: More than three times a week  ? Frequency of Social Gatherings with Friends and Family: Twice a week  ? Attends Religious Services: More than 4 times per year  ? Active Member of Clubs or Organizations: No  ? Attends Archivist Meetings: Never  ? Marital Status: Widowed  ?Intimate Partner Violence: Not At Risk  ? Fear of Current or Ex-Partner: No  ? Emotionally Abused: No  ? Physically Abused: No  ?  Sexually Abused: No  ? ? ?Past Surgical History:  ?Procedure Laterality Date  ? HAMMER TOE SURGERY Bilateral   ? JOINT REPLACEMENT    ? KNEE ARTHROPLASTY Left 06/10/2015  ? Procedure: COMPUTER ASSISTED TOTAL KNEE ARTHROPLASTY;  Surgeon: Marybelle Killings, MD;  Location: [facility];  Service: Orthopedics;  Laterality: Left;  ? KNEE ARTHROSCOPY Bilateral 02/22/2000  ? LUMBAR LAMINECTOMY  X 2  ? TONSILLECTOMY    ? ? ?Family History  ?Problem Relation Age of Onset  ? Heart disease Father   ?     died age 21-MI  ? Diabetes type II Brother   ? Melanoma Brother   ? ? ?No Known Allergies ? ?Current Outpatient Medications on File Prior to Visit  ?Medication Sig Dispense Refill  ? Cyanocobalamin (B-12) 100 MCG TABS B12    ? fluorometholone (FML) 0.1 % ophthalmic suspension fluorometholone 0.1 % eye  drops,suspension    ? fluorometholone (FML) 0.1 % ophthalmic suspension Place into the left eye.    ? metFORMIN (GLUCOPHAGE) 1000 MG tablet metformin    ? metFORMIN (GLUCOPHAGE-XR) 500 MG 24 hr tablet TAKE ONE TABLET DAILY WITH BREAKFAST 180 tablet 1  ? Multiple Vitamins-Minerals (ZINC PO) Take by mouth.    ? neomycin-polymyxin b-dexamethasone (MAXITROL) 3.5-10000-0.1 SUSP Place 1 drop into the left eye 2 (two) times daily.    ? neomycin-polymyxin-dexameth (MAXITROL) 0.1 % OINT     ? neomycin-polymyxin-dexamethasone (MAXITROL) 0.1 % ophthalmic suspension neomycin-polymyxin-dexameth 3.5 mg/mL-10,000 unit/mL-0.1% eye drops    ? rosuvastatin (CRESTOR) 20 MG tablet Take 1 tablet (20 mg total) by mouth daily. 90 tablet 1  ? Zinc 100 MG TABS zinc    ? ?No current facility-administered medications on file prior to visit.  ? ? ?BP 126/64   Pulse 63   Temp 98 ?F (36.7 ?C) (Oral)   Ht '5\' 11"'$  (1.803 m)   Wt 215 lb (97.5 kg)   SpO2 98%   BMI 29.99 kg/m?  ? ? ?   ?Objective:  ? Physical Exam ?Vitals and nursing note reviewed.  ?Constitutional:   ?   Appearance: Normal appearance.  ?Cardiovascular:  ?   Rate and Rhythm: Normal rate and regular rhythm.  ?   Pulses: Normal pulses.  ?   Heart sounds: Normal heart sounds.  ?Pulmonary:  ?   Effort: Pulmonary effort is normal.  ?   Breath sounds: Normal breath sounds.  ?Abdominal:  ?   General: Abdomen is flat. Bowel sounds are normal. There is no distension.  ?   Palpations: Abdomen is soft.  ?   Tenderness: There is no abdominal tenderness.  ?Genitourinary: ?   Prostate: Normal.  ?   Rectum: Guaiac result negative. Internal hemorrhoid (7 o clock position) present. No tenderness, anal fissure or external hemorrhoid. Normal anal tone.  ?Musculoskeletal:     ?   General: Normal range of motion.  ?Skin: ?   General: Skin is warm and dry.  ?   Capillary Refill: Capillary refill takes less than 2 seconds.  ?Neurological:  ?   General: No focal deficit present.  ?   Mental Status: He  is alert and oriented to person, place, and time.  ?Psychiatric:     ?   Mood and Affect: Mood normal.     ?   Behavior: Behavior normal.     ?   Thought Content: Thought content normal.     ?   Judgment: Judgment normal.  ? ?   ?Assessment &  Plan:  ?1. Internal hemorrhoid ? ?- hydrocortisone (ANUSOL-HC) 25 MG suppository; Place 1 suppository (25 mg total) rectally 2 (two) times daily.  Dispense: 12 suppository; Refill: 0 ? ?2. Rectal bleeding ?- likely from hemorrhoid ?- Follow up if bleeding continues   ?- add fiber to diet ?- No straining or sitting on toilet for extended period of time  ? ?Dorothyann Peng, NP ? ? ? ?

## 2021-06-10 DIAGNOSIS — H524 Presbyopia: Secondary | ICD-10-CM | POA: Diagnosis not present

## 2021-06-10 DIAGNOSIS — H02135 Senile ectropion of left lower eyelid: Secondary | ICD-10-CM | POA: Diagnosis not present

## 2021-06-10 DIAGNOSIS — H25813 Combined forms of age-related cataract, bilateral: Secondary | ICD-10-CM | POA: Diagnosis not present

## 2021-06-10 DIAGNOSIS — E119 Type 2 diabetes mellitus without complications: Secondary | ICD-10-CM | POA: Diagnosis not present

## 2021-06-10 DIAGNOSIS — H04213 Epiphora due to excess lacrimation, bilateral lacrimal glands: Secondary | ICD-10-CM | POA: Diagnosis not present

## 2021-06-10 LAB — HM DIABETES EYE EXAM

## 2021-07-01 ENCOUNTER — Ambulatory Visit (INDEPENDENT_AMBULATORY_CARE_PROVIDER_SITE_OTHER): Payer: Medicare Other

## 2021-07-01 ENCOUNTER — Ambulatory Visit (INDEPENDENT_AMBULATORY_CARE_PROVIDER_SITE_OTHER): Payer: Medicare Other | Admitting: Podiatry

## 2021-07-01 DIAGNOSIS — M21619 Bunion of unspecified foot: Secondary | ICD-10-CM

## 2021-07-01 DIAGNOSIS — M21612 Bunion of left foot: Secondary | ICD-10-CM | POA: Diagnosis not present

## 2021-07-01 DIAGNOSIS — M2041 Other hammer toe(s) (acquired), right foot: Secondary | ICD-10-CM

## 2021-07-01 DIAGNOSIS — M79672 Pain in left foot: Secondary | ICD-10-CM

## 2021-07-01 DIAGNOSIS — M19072 Primary osteoarthritis, left ankle and foot: Secondary | ICD-10-CM | POA: Diagnosis not present

## 2021-07-03 NOTE — Progress Notes (Signed)
Subjective:  ? ?Patient ID: Tyler Greer, male   DOB: 86 y.o.   MRN: [id number]  ? ?HPI ?86 year old male presents the office today with concerns of pain which has been chronic to his left big toe with elevation of his toe as well as the toe going over the top of the second toe causing discomfort.  He states he put holes in his shoes and socks given the elevation of the toe.  Previously had bunion surgery but states the toe started leaning over.  He has tried shoe modifications, offloading and padding without any significant resolution.  No recent injuries.  He wants to have surgery to have this fixed. ? ?Reports that he had nerve damage to his feet after undergoing knee surgery. ? ? ?Review of Systems  ?All other systems reviewed and are negative. ? ?Past Medical History:  ?Diagnosis Date  ? Arthritis   ? "hands and knees" (06/10/2015)  ? Basal cell carcinoma, arm   ? "both"  ? Basal cell carcinoma, face   ? Chest pain   ? Diabetes mellitus type II 10/23/2007  ? Intention tremor   ? Migraine   ? "none in years" (06/10/2015)  ? Osteoarthritis   ? Paresthesia of foot   ? bilateral  ? Stroke ([place]) 02/22/2003  ? mini stroke  ? ? ?Past Surgical History:  ?Procedure Laterality Date  ? HAMMER TOE SURGERY Bilateral   ? JOINT REPLACEMENT    ? KNEE ARTHROPLASTY Left 06/10/2015  ? Procedure: COMPUTER ASSISTED TOTAL KNEE ARTHROPLASTY;  Surgeon: Marybelle Killings, MD;  Location: [facility];  Service: Orthopedics;  Laterality: Left;  ? KNEE ARTHROSCOPY Bilateral 02/22/2000  ? LUMBAR LAMINECTOMY  X 2  ? TONSILLECTOMY    ? ? ? ?Current Outpatient Medications:  ?  Cyanocobalamin (B-12) 100 MCG TABS, B12, Disp: , Rfl:  ?  fluorometholone (FML) 0.1 % ophthalmic suspension, fluorometholone 0.1 % eye drops,suspension, Disp: , Rfl:  ?  fluorometholone (FML) 0.1 % ophthalmic suspension, Place into the left eye., Disp: , Rfl:  ?  hydrocortisone (ANUSOL-HC) 25 MG suppository, Place 1 suppository (25 mg total) rectally 2 (two) times daily., Disp: 12  suppository, Rfl: 0 ?  metFORMIN (GLUCOPHAGE) 1000 MG tablet, metformin, Disp: , Rfl:  ?  metFORMIN (GLUCOPHAGE-XR) 500 MG 24 hr tablet, TAKE ONE TABLET DAILY WITH BREAKFAST, Disp: 180 tablet, Rfl: 1 ?  Multiple Vitamins-Minerals (ZINC PO), Take by mouth., Disp: , Rfl:  ?  neomycin-polymyxin b-dexamethasone (MAXITROL) 3.5-10000-0.1 SUSP, Place 1 drop into the left eye 2 (two) times daily., Disp: , Rfl:  ?  neomycin-polymyxin-dexameth (MAXITROL) 0.1 % OINT, , Disp: , Rfl:  ?  neomycin-polymyxin-dexamethasone (MAXITROL) 0.1 % ophthalmic suspension, neomycin-polymyxin-dexameth 3.5 mg/mL-10,000 unit/mL-0.1% eye drops, Disp: , Rfl:  ?  rosuvastatin (CRESTOR) 20 MG tablet, Take 1 tablet (20 mg total) by mouth daily., Disp: 90 tablet, Rfl: 1 ?  Zinc 100 MG TABS, zinc, Disp: , Rfl:  ? ?No Known Allergies ? ? ? ? ?   ?Objective:  ?Physical Exam  ?General: AAO x3, NAD ? ?Dermatological: Skin is warm, dry and supple bilateral.  There are no open sores, no preulcerative lesions, no rash or signs of infection present. ? ?Vascular: Dorsalis Pedis artery and Posterior Tibial artery pedal pulses are 2/4 bilateral with immedate capillary fill time. There is no pain with calf compression, swelling, warmth, erythema.  ? ?Neruologic: Grossly intact via light touch bilateral. Vibratory intact via tuning fork bilateral. Protective threshold with Semmes Wienstein monofilament intact  to all pedal sites bilateral. Patellar and Achilles deep tendon reflexes 2+ bilateral. No Babinski or clonus noted bilateral.  ? ?Musculoskeletal: Hallux abductus is present the second toes overlapping the second toe.  There is decreased range of motion of the first MPJ and there is tenderness with first MPJ range of motion.  Dorsiflexion of the hallux noted.  Muscular strength 5/5 in all groups tested bilateral. ? ?Gait: Unassisted, Nonantalgic.  ? ? ?   ?Assessment:  ? ?Recurrent bunion deformity, arthritis left first MPJ ? ?   ?Plan:  ?-Treatment options  discussed including all alternatives, risks, and complications ?-Etiology of symptoms were discussed ?-X-rays were obtained and reviewed with the patient.  Arthritic changes present the first MPJ.  Hallux abductus present.  There is no evidence of acute fracture. ?-We discussed the conservative as well as surgical treatment options.  He is attempted numerous conservative treatments and was proceed with surgery.  Discussed first MPJ arthrodesis.  We discussed the surgery as well as postoperative course including at least first 2 weeks of nonweightbearing.  He wants to proceed with surgery ?-The incision placement as well as the postoperative course was discussed with the patient. I discussed risks of the surgery which include, but not limited to, infection, bleeding, pain, swelling, need for further surgery, delayed or nonhealing, painful or ugly scar, numbness or sensation changes, over/under correction, recurrence, transfer lesions, further deformity, hardware failure, DVT/PE, loss of toe/foot. Patient understands these risks and wishes to proceed with surgery. The surgical consent was reviewed with the patient all 3 pages were signed. No promises or guarantees were given to the outcome of the procedure. All questions were answered to the best of my ability. Before the surgery the patient was encouraged to call the office if there is any further questions. The surgery will be performed at the [facility] on an outpatient basis. ?-CAM boot dispensed for postoperative use ? ?Trula Slade DPM ? ?   ? ?

## 2021-07-06 ENCOUNTER — Other Ambulatory Visit: Payer: Self-pay | Admitting: Podiatry

## 2021-07-06 DIAGNOSIS — Z01818 Encounter for other preprocedural examination: Secondary | ICD-10-CM

## 2021-07-06 DIAGNOSIS — E119 Type 2 diabetes mellitus without complications: Secondary | ICD-10-CM

## 2021-07-12 DIAGNOSIS — L821 Other seborrheic keratosis: Secondary | ICD-10-CM | POA: Diagnosis not present

## 2021-07-12 DIAGNOSIS — D692 Other nonthrombocytopenic purpura: Secondary | ICD-10-CM | POA: Diagnosis not present

## 2021-07-12 DIAGNOSIS — D045 Carcinoma in situ of skin of trunk: Secondary | ICD-10-CM | POA: Diagnosis not present

## 2021-07-12 DIAGNOSIS — L570 Actinic keratosis: Secondary | ICD-10-CM | POA: Diagnosis not present

## 2021-07-12 DIAGNOSIS — D485 Neoplasm of uncertain behavior of skin: Secondary | ICD-10-CM | POA: Diagnosis not present

## 2021-07-12 DIAGNOSIS — Z85828 Personal history of other malignant neoplasm of skin: Secondary | ICD-10-CM | POA: Diagnosis not present

## 2021-07-13 LAB — CBC WITH DIFFERENTIAL/PLATELET
Basophils Absolute: 0 10*3/uL (ref 0.0–0.2)
Basos: 1 %
EOS (ABSOLUTE): 0.2 10*3/uL (ref 0.0–0.4)
Eos: 4 %
Hematocrit: 43.4 % (ref 37.5–51.0)
Hemoglobin: 14.8 g/dL (ref 13.0–17.7)
Immature Grans (Abs): 0 10*3/uL (ref 0.0–0.1)
Immature Granulocytes: 0 %
Lymphocytes Absolute: 1.1 10*3/uL (ref 0.7–3.1)
Lymphs: 19 %
MCH: 30.2 pg (ref 26.6–33.0)
MCHC: 34.1 g/dL (ref 31.5–35.7)
MCV: 89 fL (ref 79–97)
Monocytes Absolute: 0.6 10*3/uL (ref 0.1–0.9)
Monocytes: 10 %
Neutrophils Absolute: 3.6 10*3/uL (ref 1.4–7.0)
Neutrophils: 66 %
Platelets: 215 10*3/uL (ref 150–450)
RBC: 4.9 x10E6/uL (ref 4.14–5.80)
RDW: 13.5 % (ref 11.6–15.4)
WBC: 5.5 10*3/uL (ref 3.4–10.8)

## 2021-07-13 LAB — BASIC METABOLIC PANEL
BUN/Creatinine Ratio: 15 (ref 10–24)
BUN: 20 mg/dL (ref 8–27)
CO2: 21 mmol/L (ref 20–29)
Calcium: 9.3 mg/dL (ref 8.6–10.2)
Chloride: 102 mmol/L (ref 96–106)
Creatinine, Ser: 1.34 mg/dL — ABNORMAL HIGH (ref 0.76–1.27)
Glucose: 101 mg/dL — ABNORMAL HIGH (ref 70–99)
Potassium: 4.5 mmol/L (ref 3.5–5.2)
Sodium: 139 mmol/L (ref 134–144)
eGFR: 53 mL/min/{1.73_m2} — ABNORMAL LOW (ref >59)

## 2021-07-13 LAB — HEMOGLOBIN A1C
Est. average glucose Bld gHb Est-mCnc: 134 mg/dL
Hgb A1c MFr Bld: 6.3 % — ABNORMAL HIGH (ref 4.8–5.6)

## 2021-07-20 ENCOUNTER — Other Ambulatory Visit: Payer: Self-pay | Admitting: Podiatry

## 2021-07-20 DIAGNOSIS — M21619 Bunion of unspecified foot: Secondary | ICD-10-CM

## 2021-07-28 ENCOUNTER — Encounter: Payer: Self-pay | Admitting: Podiatry

## 2021-07-28 ENCOUNTER — Other Ambulatory Visit: Payer: Self-pay | Admitting: Podiatry

## 2021-07-28 DIAGNOSIS — M2022 Hallux rigidus, left foot: Secondary | ICD-10-CM | POA: Diagnosis not present

## 2021-07-28 DIAGNOSIS — M19072 Primary osteoarthritis, left ankle and foot: Secondary | ICD-10-CM | POA: Diagnosis not present

## 2021-07-28 DIAGNOSIS — M21612 Bunion of left foot: Secondary | ICD-10-CM | POA: Diagnosis not present

## 2021-07-28 DIAGNOSIS — Z472 Encounter for removal of internal fixation device: Secondary | ICD-10-CM | POA: Diagnosis not present

## 2021-07-28 DIAGNOSIS — M24575 Contracture, left foot: Secondary | ICD-10-CM | POA: Diagnosis not present

## 2021-07-28 DIAGNOSIS — G8918 Other acute postprocedural pain: Secondary | ICD-10-CM | POA: Diagnosis not present

## 2021-07-28 DIAGNOSIS — T8484XA Pain due to internal orthopedic prosthetic devices, implants and grafts, initial encounter: Secondary | ICD-10-CM | POA: Diagnosis not present

## 2021-07-28 DIAGNOSIS — M13872 Other specified arthritis, left ankle and foot: Secondary | ICD-10-CM | POA: Diagnosis not present

## 2021-07-28 DIAGNOSIS — M205X2 Other deformities of toe(s) (acquired), left foot: Secondary | ICD-10-CM | POA: Diagnosis not present

## 2021-07-28 MED ORDER — PROMETHAZINE HCL 25 MG PO TABS: 25.0000 mg | ORAL_TABLET | Freq: Three times a day (TID) | ORAL | 0 refills | Status: DC | PRN

## 2021-07-28 MED ORDER — CEPHALEXIN 500 MG PO CAPS: 500.0000 mg | ORAL_CAPSULE | Freq: Three times a day (TID) | ORAL | 0 refills | Status: DC

## 2021-07-28 MED ORDER — HYDROCODONE-ACETAMINOPHEN 5-325 MG PO TABS: 1.0000 | ORAL_TABLET | Freq: Four times a day (QID) | ORAL | 0 refills | Status: DC | PRN

## 2021-07-28 NOTE — Progress Notes (Signed)
Postop medications sent 

## 2021-08-02 ENCOUNTER — Ambulatory Visit (INDEPENDENT_AMBULATORY_CARE_PROVIDER_SITE_OTHER): Payer: Medicare Other

## 2021-08-02 ENCOUNTER — Ambulatory Visit (INDEPENDENT_AMBULATORY_CARE_PROVIDER_SITE_OTHER): Payer: Medicare Other | Admitting: Podiatry

## 2021-08-02 DIAGNOSIS — M13872 Other specified arthritis, left ankle and foot: Secondary | ICD-10-CM | POA: Diagnosis not present

## 2021-08-02 DIAGNOSIS — Z9889 Other specified postprocedural states: Secondary | ICD-10-CM

## 2021-08-02 DIAGNOSIS — M21612 Bunion of left foot: Secondary | ICD-10-CM

## 2021-08-02 MED ORDER — CEPHALEXIN 500 MG PO CAPS: 500.0000 mg | ORAL_CAPSULE | Freq: Three times a day (TID) | ORAL | 0 refills | Status: DC

## 2021-08-02 MED ORDER — HYDROCODONE-ACETAMINOPHEN 5-325 MG PO TABS: 1.0000 | ORAL_TABLET | Freq: Four times a day (QID) | ORAL | 0 refills | Status: DC | PRN

## 2021-08-04 DIAGNOSIS — H04223 Epiphora due to insufficient drainage, bilateral lacrimal glands: Secondary | ICD-10-CM | POA: Diagnosis not present

## 2021-08-04 DIAGNOSIS — H02535 Eyelid retraction left lower eyelid: Secondary | ICD-10-CM | POA: Diagnosis not present

## 2021-08-04 DIAGNOSIS — H11443 Conjunctival cysts, bilateral: Secondary | ICD-10-CM | POA: Diagnosis not present

## 2021-08-04 DIAGNOSIS — H16213 Exposure keratoconjunctivitis, bilateral: Secondary | ICD-10-CM | POA: Diagnosis not present

## 2021-08-04 DIAGNOSIS — H04523 Eversion of bilateral lacrimal punctum: Secondary | ICD-10-CM | POA: Diagnosis not present

## 2021-08-04 DIAGNOSIS — H02532 Eyelid retraction right lower eyelid: Secondary | ICD-10-CM | POA: Diagnosis not present

## 2021-08-04 NOTE — Progress Notes (Signed)
Subjective: Tyler Greer is a 86 y.o. is seen today in office s/p left first MPJ arthrodesis preformed on 07/28/2021.  States he has been doing well his pain is currently controlled but still taking medication.  Denies any fevers or chills.  No recent injury or falls.  He has been wearing his cam boot.    Objective: General: No acute distress, AAOx3 -presented with a friend DP/PT pulses palpable 2/4, CRT < 3 sec to all digits.  Left foot: Incision is well coapted without any evidence of dehiscence.  There is surrounding erythema which is blanchable without any increased temperature.  There is no drainage or pus.  No fluctuance or crepitation.  No malodor.  No significant pain on the surgical site today. No pain with calf compression, swelling, warmth, erythema.   Assessment and Plan:  Status post left foot first MPJ arthrodesis, doing well with no complications   -Treatment options discussed including all alternatives, risks, and complications -X-rays obtained reviewed.  3 views left foot were obtained.  No evidence of acute fracture.  Status post first MPJ arthrodesis with uncomplicated factors.  Hardware intact. -Incision was cleaned.  A small amount of antibiotic ointment was applied followed by a bandage.  He can keep the dressing clean, dry, intact. -Given the erythema which I think is more inflammatory as opposed to infection I am going to continue antibiotics and refill Keflex. -Continue NWB, Cam boot. If needed he can put weight to his heel in the boot for short distances. -Monitor for any clinical signs or symptoms of infection and directed to call the office immediately should any occur or go to the ER.  Trula Slade DPM

## 2021-08-10 ENCOUNTER — Ambulatory Visit (INDEPENDENT_AMBULATORY_CARE_PROVIDER_SITE_OTHER): Payer: Medicare Other | Admitting: Podiatry

## 2021-08-10 DIAGNOSIS — Z9889 Other specified postprocedural states: Secondary | ICD-10-CM

## 2021-08-10 DIAGNOSIS — M21612 Bunion of left foot: Secondary | ICD-10-CM

## 2021-08-30 ENCOUNTER — Ambulatory Visit (INDEPENDENT_AMBULATORY_CARE_PROVIDER_SITE_OTHER): Payer: Medicare Other | Admitting: Podiatry

## 2021-08-30 ENCOUNTER — Ambulatory Visit (INDEPENDENT_AMBULATORY_CARE_PROVIDER_SITE_OTHER): Payer: Medicare Other

## 2021-08-30 DIAGNOSIS — M13872 Other specified arthritis, left ankle and foot: Secondary | ICD-10-CM

## 2021-08-30 DIAGNOSIS — Z9889 Other specified postprocedural states: Secondary | ICD-10-CM | POA: Diagnosis not present

## 2021-08-30 DIAGNOSIS — M21612 Bunion of left foot: Secondary | ICD-10-CM

## 2021-08-31 NOTE — Progress Notes (Signed)
Subjective: Tyler Greer is a 86 y.o. is seen today in office s/p left first MPJ arthrodesis preformed on 07/28/2021.  States he has been doing well and he admits that he has been very active on his foot.  Overall states he is doing well not having significant pain.  No fevers or chills that he reports.  No recent injuries.  He has no other concerns.  No fevers or chills that he reports.  Objective: General: No acute distress, AAOx3 -presented with a friend DP/PT pulses palpable 2/4, CRT < 3 sec to all digits.  Left foot: Incision is well coapted without any evidence of dehiscence.  There is just a minimal edema but there is no erythema or warmth.  Toes in rectus position the arthrodesis site appears to be stable.  Some scabbing is present in the incision there is no evidence of dehiscence noted today.  There is no drainage or pus or any fluctuance or crepitation.  No clinical signs of infection noted.  No pain with calf compression, swelling, warmth, erythema.   Assessment and Plan:  Status post left foot first MPJ arthrodesis, doing well with no complications   -Treatment options discussed including all alternatives, risks, and complications -X-rays were obtained reviewed.  3 views of the left foot were obtained.  There is no evidence of acute fracture.  Hardware intact with any complicating factors.  Status post first MTPJ arthrodesis. -Dressing was changed today.  Discussed the insertion of the dressing daily if you would like.  Discussed washing with soap and water, dry thoroughly apply small amount of antibiotic ointment and a bandage.  Continue with cam boot, limited weightbearing and use the walker.  He states that he has been very active and not using the walker and walking around in the boot.  Discussed try to stay off the foot is much as possible to the left for healing. -Monitor for any clinical signs or symptoms of infection and directed to call the office immediately should any occur  or go to the ER.  Return in about 2 weeks (around 09/13/2021).  Trula Slade DPM

## 2021-09-13 ENCOUNTER — Ambulatory Visit (INDEPENDENT_AMBULATORY_CARE_PROVIDER_SITE_OTHER): Payer: Medicare Other | Admitting: Podiatry

## 2021-09-13 ENCOUNTER — Ambulatory Visit (INDEPENDENT_AMBULATORY_CARE_PROVIDER_SITE_OTHER): Payer: Medicare Other

## 2021-09-13 ENCOUNTER — Ambulatory Visit: Payer: Medicare Other

## 2021-09-13 DIAGNOSIS — M13872 Other specified arthritis, left ankle and foot: Secondary | ICD-10-CM

## 2021-09-13 DIAGNOSIS — Z9889 Other specified postprocedural states: Secondary | ICD-10-CM

## 2021-09-13 DIAGNOSIS — M21612 Bunion of left foot: Secondary | ICD-10-CM

## 2021-09-13 NOTE — Progress Notes (Signed)
Subjective: Tyler Greer is a 86 y.o. is seen today in office s/p left first MPJ arthrodesis preformed on 07/28/2021.  States he is doing well and is eager to get back into a regular shoe.  He has been walking in the cam boot.  No recent injury or falls.  He is noticing dry skin but otherwise been doing well.  No fevers or chills.  Objective: General: No acute distress, AAOx3 -presented with a friend DP/PT pulses palpable 2/4, CRT < 3 sec to all digits.  Left foot: Incision is well coapted without any evidence of dehiscence.  Scar is formed.  There was some scabbing present in the incision which I debrided today underlying scar intact.  There is minimal edema.  There is no erythema or warmth.  Arthrodesis site is stable.  Toes in rectus position.  No pain on exam today. No pain with calf compression, swelling, warmth, erythema.   Assessment and Plan:  Status post left foot first MPJ arthrodesis, doing well with no complications   -Treatment options discussed including all alternatives, risks, and complications -X-rays obtained reviewed.  3 views of the left foot were obtained.  Hardware intact with any complicating factors.  Increased consolidation noted across the arthrodesis site. -At this time we discussed gradual transition to regular shoe as tolerated. -Ice, elevation as well as compression of any postoperative edema.  Return in about 3 weeks (around 10/04/2021) for post-op visit, x-ray.  Trula Slade DPM

## 2021-10-04 ENCOUNTER — Ambulatory Visit (INDEPENDENT_AMBULATORY_CARE_PROVIDER_SITE_OTHER): Payer: Medicare Other | Admitting: Podiatry

## 2021-10-04 DIAGNOSIS — M21612 Bunion of left foot: Secondary | ICD-10-CM

## 2021-10-04 DIAGNOSIS — Z9889 Other specified postprocedural states: Secondary | ICD-10-CM

## 2021-10-04 NOTE — Progress Notes (Signed)
Subjective: Chief Complaint  Patient presents with   Routine Post Op    POV #4 DOS 07/28/2021 LT FOOT REMOVAL OF HARDWARE, FUSION OF 1ST MPJ, Pt has no N/V/F/C/SOB, Pt is still having pain, pain rate is a 3 out of 10, Having burning on the bottom of the foot,     Tyler Greer is a 86 y.o. is seen today in office s/p left first MPJ arthrodesis preformed on 07/28/2021.  States that he has tried to go back into a regular shoe for patient had time and feels better when he wears his shoe.  He states he will get some soreness and he was icing the back of the boot.  Otherwise he has been doing well.  He does have some burning to his foot he does state that he has neuropathy.  No recent injury or changes.  Minimal swelling.  Denies fevers or chills.    Objective: General: No acute distress, AAOx3 -presented with a friend DP/PT pulses palpable 2/4, CRT < 3 sec to all digits.  Left foot: Incision is well coapted without any evidence of dehiscence. Scar is well formed.  There is no pain on surgical site the arthrodesis site appears to be stable.  There is trace edema there is no erythema or warmth. MMT 5/5 No pain with calf compression, swelling, warmth, erythema.   Assessment and Plan:  Status post left foot first MPJ arthrodesis, doing well with no complications   -Treatment options discussed including all alternatives, risks, and complications -X-rays obtained reviewed.  3 views of the left foot were obtained.  Hardware intact with any complicating factors.  Increased consolidation noted across the arthrodesis site. -Discussed that he can continue to transition out of the boot into a regular shoe as tolerated.  Continue ice, elevate.  Discussed gradual increase activity level as tolerated.  Trula Slade DPM

## 2021-10-18 DIAGNOSIS — Z85828 Personal history of other malignant neoplasm of skin: Secondary | ICD-10-CM | POA: Diagnosis not present

## 2021-10-18 DIAGNOSIS — L821 Other seborrheic keratosis: Secondary | ICD-10-CM | POA: Diagnosis not present

## 2021-10-18 DIAGNOSIS — L570 Actinic keratosis: Secondary | ICD-10-CM | POA: Diagnosis not present

## 2021-11-01 ENCOUNTER — Ambulatory Visit (INDEPENDENT_AMBULATORY_CARE_PROVIDER_SITE_OTHER): Payer: Medicare Other

## 2021-11-01 ENCOUNTER — Ambulatory Visit (INDEPENDENT_AMBULATORY_CARE_PROVIDER_SITE_OTHER): Payer: Medicare Other | Admitting: Podiatry

## 2021-11-01 DIAGNOSIS — Z9889 Other specified postprocedural states: Secondary | ICD-10-CM

## 2021-11-01 DIAGNOSIS — M13872 Other specified arthritis, left ankle and foot: Secondary | ICD-10-CM | POA: Diagnosis not present

## 2021-11-01 DIAGNOSIS — M21612 Bunion of left foot: Secondary | ICD-10-CM

## 2021-12-27 ENCOUNTER — Ambulatory Visit (INDEPENDENT_AMBULATORY_CARE_PROVIDER_SITE_OTHER): Payer: Medicare Other

## 2021-12-27 DIAGNOSIS — Z23 Encounter for immunization: Secondary | ICD-10-CM

## 2022-01-12 ENCOUNTER — Telehealth: Payer: Self-pay | Admitting: *Deleted

## 2022-01-12 NOTE — Patient Outreach (Signed)
  Care Coordination   01/12/2022 Name: Tyler Greer MRN: [id number] DOB: May 09, 1935   Care Coordination Outreach Attempts:  An unsuccessful telephone outreach was attempted today to offer the patient information about available care coordination services as a benefit of their health plan.   Follow Up Plan:  Additional outreach attempts will be made to offer the patient care coordination information and services.   Encounter Outcome:  No Answer  Care Coordination Interventions Activated:  No   Care Coordination Interventions:  No, not indicated    Raina Mina, RN Care Management Coordinator [place] [facility] [phone]

## 2022-02-03 DIAGNOSIS — M25511 Pain in right shoulder: Secondary | ICD-10-CM | POA: Diagnosis not present

## 2022-02-03 DIAGNOSIS — M25512 Pain in left shoulder: Secondary | ICD-10-CM | POA: Diagnosis not present

## 2022-02-03 DIAGNOSIS — M19011 Primary osteoarthritis, right shoulder: Secondary | ICD-10-CM | POA: Diagnosis not present

## 2022-02-08 ENCOUNTER — Encounter: Payer: Self-pay | Admitting: Adult Health

## 2022-02-08 ENCOUNTER — Ambulatory Visit (INDEPENDENT_AMBULATORY_CARE_PROVIDER_SITE_OTHER): Payer: Medicare Other | Admitting: Adult Health

## 2022-02-08 VITALS — BP 134/86 | HR 70 | Temp 97.5°F | Ht 71.0 in | Wt 205.0 lb

## 2022-02-08 DIAGNOSIS — R361 Hematospermia: Secondary | ICD-10-CM

## 2022-02-08 DIAGNOSIS — E0840 Diabetes mellitus due to underlying condition with diabetic neuropathy, unspecified: Secondary | ICD-10-CM | POA: Diagnosis not present

## 2022-02-08 DIAGNOSIS — N400 Enlarged prostate without lower urinary tract symptoms: Secondary | ICD-10-CM | POA: Diagnosis not present

## 2022-02-08 LAB — POCT URINALYSIS DIPSTICK
Bilirubin, UA: NEGATIVE
Blood, UA: NEGATIVE
Glucose, UA: NEGATIVE
Ketones, UA: NEGATIVE
Leukocytes, UA: NEGATIVE
Nitrite, UA: NEGATIVE
Protein, UA: NEGATIVE
Spec Grav, UA: 1.015 (ref 1.010–1.025)
Urobilinogen, UA: 0.2 E.U./dL
pH, UA: 5.5 (ref 5.0–8.0)

## 2022-02-08 NOTE — Progress Notes (Signed)
Subjective:    Patient ID: Tyler Greer, male    DOB: 1935/06/13, 86 y.o.   MRN: [id number]  Hematuria   86 year old male who  has a past medical history of Arthritis, Basal cell carcinoma, arm, Basal cell carcinoma, face, Chest pain, Diabetes mellitus type II (10/23/2007), Intention tremor, Migraine, Osteoarthritis, Paresthesia of foot, and Stroke ([place]) (02/22/2003).  He presents to the office today for concern of dark red blood in his semen. He reports that he has having sex with his girlfriend and when he went to the bathroom after sex he noticed that he had blood mixed in his semen.   He has not had any blood in his urine or stool. Reports no fevers, chills, painful bowel movements or pain with sitting.   Three days prior he was using a post hole digger to dig a hole 24 inches deep into hard soil.   He also needs a diabetic follow up. He is currently maintained on metformin 500 mg ER daily. He does not check his blood sugars at home on a regular basis.  Lab Results  Component Value Date   HGBA1C 6.3 (H) 07/12/2021      Review of Systems See HPI   Past Medical History:  Diagnosis Date   Arthritis    "hands and knees" (06/10/2015)   Basal cell carcinoma, arm    "both"   Basal cell carcinoma, face    Chest pain    Diabetes mellitus type II 10/23/2007   Intention tremor    Migraine    "none in years" (06/10/2015)   Osteoarthritis    Paresthesia of foot    bilateral   Stroke ([place]) 02/22/2003   mini stroke    Social History   Socioeconomic History   Marital status: Widowed    Spouse name: Not on file   Number of children: 2   Years of education: Not on file   Highest education level: Not on file  Occupational History   Occupation: Retired-Contractor  Tobacco Use   Smoking status: Never   Smokeless tobacco: Never   Tobacco comments:    "might have smoked 1 pack of cigarettes in my life"  Substance and Sexual Activity   Alcohol use: No   Drug use: No    Sexual activity: Not Currently  Other Topics Concern   Not on file  Social History Narrative   Retired Clinical biochemist    Social Determinants of Health   Financial Resource Strain: [place]  (04/19/2021)   Overall Financial Resource Strain (CARDIA)    Difficulty of Paying Living Expenses: Not hard at all  Food Insecurity: No [place] (04/19/2021)   Hunger Vital Sign    Worried About Running Out of Food in the Last Year: Never true    [place] in the Last Year: Never true  Transportation Needs: No Transportation Needs (04/19/2021)   PRAPARE - Hydrologist (Medical): No    Lack of Transportation (Non-Medical): No  Physical Activity: Sufficiently Active (04/19/2021)   Exercise Vital Sign    Days of Exercise per Week: 5 days    Minutes of Exercise per Session: 40 min  Stress: No Stress Concern Present (04/19/2021)   [place]    Feeling of Stress : Not at all  Social Connections: Moderately Isolated (04/19/2021)   Social Connection and Isolation Panel [NHANES]    Frequency of Communication with Friends  and Family: More than three times a week    Frequency of Social Gatherings with Friends and Family: Twice a week    Attends Religious Services: More than 4 times per year    Active Member of [employer] or Organizations: No    Attends Archivist Meetings: Never    Marital Status: Widowed  Intimate Partner Violence: Not At Risk (04/19/2021)   Humiliation, Afraid, Rape, and Kick questionnaire    Fear of Current or Ex-Partner: No    Emotionally Abused: No    Physically Abused: No    Sexually Abused: No    Past Surgical History:  Procedure Laterality Date   HAMMER TOE SURGERY Bilateral    JOINT REPLACEMENT     KNEE ARTHROPLASTY Left 06/10/2015   Procedure: COMPUTER ASSISTED TOTAL KNEE ARTHROPLASTY;  Surgeon: Marybelle Killings, MD;  Location: [facility];  Service: Orthopedics;   Laterality: Left;   KNEE ARTHROSCOPY Bilateral 02/22/2000   LUMBAR LAMINECTOMY  X 2   TONSILLECTOMY      Family History  Problem Relation Age of Onset   Heart disease Father        died age 25-MI   Diabetes type II Brother    Melanoma Brother     No Known Allergies  Current Outpatient Medications on File Prior to Visit  Medication Sig Dispense Refill   cephALEXin (KEFLEX) 500 MG capsule Take 1 capsule (500 mg total) by mouth 3 (three) times daily. 21 capsule 0   Cyanocobalamin (B-12) 100 MCG TABS B12     fluorometholone (FML) 0.1 % ophthalmic suspension fluorometholone 0.1 % eye drops,suspension     fluorometholone (FML) 0.1 % ophthalmic suspension Place into the left eye.     HYDROcodone-acetaminophen (NORCO/VICODIN) 5-325 MG tablet Take 1-2 tablets by mouth every 6 (six) hours as needed. 25 tablet 0   hydrocortisone (ANUSOL-HC) 25 MG suppository Place 1 suppository (25 mg total) rectally 2 (two) times daily. 12 suppository 0   metFORMIN (GLUCOPHAGE) 1000 MG tablet metformin     metFORMIN (GLUCOPHAGE-XR) 500 MG 24 hr tablet TAKE ONE TABLET DAILY WITH BREAKFAST 180 tablet 1   Multiple Vitamins-Minerals (ZINC PO) Take by mouth.     neomycin-polymyxin b-dexamethasone (MAXITROL) 3.5-10000-0.1 SUSP Place 1 drop into the left eye 2 (two) times daily.     neomycin-polymyxin-dexameth (MAXITROL) 0.1 % OINT      neomycin-polymyxin-dexamethasone (MAXITROL) 0.1 % ophthalmic suspension neomycin-polymyxin-dexameth 3.5 mg/mL-10,000 unit/mL-0.1% eye drops     promethazine (PHENERGAN) 25 MG tablet Take 1 tablet (25 mg total) by mouth every 8 (eight) hours as needed for nausea or vomiting. 20 tablet 0   rosuvastatin (CRESTOR) 20 MG tablet Take 1 tablet (20 mg total) by mouth daily. 90 tablet 1   Zinc 100 MG TABS zinc     No current facility-administered medications on file prior to visit.    BP 134/86   Pulse 70   Temp (!) 97.5 F (36.4 C) (Oral)   Ht '5\' 11"'$  (1.803 m)   Wt 205 lb (93 kg)    SpO2 96%   BMI 28.59 kg/m       Objective:   Physical Exam Vitals and nursing note reviewed.  Constitutional:      Appearance: Normal appearance.  Cardiovascular:     Rate and Rhythm: Normal rate and regular rhythm.     Pulses: Normal pulses.     Heart sounds: Normal heart sounds.  Pulmonary:     Effort: Pulmonary effort is normal.  Breath sounds: Normal breath sounds.  Abdominal:     Hernia: There is no hernia in the left inguinal area or right inguinal area.  Genitourinary:    Testes: Normal.        Right: Mass, tenderness, swelling, testicular hydrocele or varicocele not present.        Left: Mass, tenderness, swelling, testicular hydrocele or varicocele not present.     Epididymis:     Right: Normal.     Left: Normal.     Prostate: Enlarged. Not tender and no nodules present.     Rectum: Normal.  Musculoskeletal:        General: Normal range of motion.  Skin:    General: Skin is warm and dry.     Capillary Refill: Capillary refill takes less than 2 seconds.  Neurological:     General: No focal deficit present.     Mental Status: He is alert and oriented to person, place, and time.  Psychiatric:        Mood and Affect: Mood normal.        Behavior: Behavior normal.        Thought Content: Thought content normal.        Judgment: Judgment normal.       Assessment & Plan:  1. Blood in semen - POC Urinalysis Dipstick- negative  - likely benign. DRE without signs of prostatitis.   - CBC with Differential/Platelet; Future - PSA; Future  2. Diabetes mellitus due to underlying condition with diabetic neuropathy, without long-term current use of insulin (HCC)  - Hemoglobin A1c; Future - Microalbumin/Creatinine Ratio, Urine 3. Benign prostatic hyperplasia without lower urinary tract symptoms  - PSA; Future  Dorothyann Peng, NP

## 2022-02-09 ENCOUNTER — Telehealth: Payer: Self-pay | Admitting: Adult Health

## 2022-02-09 ENCOUNTER — Other Ambulatory Visit: Payer: Medicare Other

## 2022-02-09 DIAGNOSIS — L300 Nummular dermatitis: Secondary | ICD-10-CM | POA: Diagnosis not present

## 2022-02-09 DIAGNOSIS — Z85828 Personal history of other malignant neoplasm of skin: Secondary | ICD-10-CM | POA: Diagnosis not present

## 2022-02-09 LAB — CBC WITH DIFFERENTIAL/PLATELET
Basophils Absolute: 0.1 10*3/uL (ref 0.0–0.1)
Basophils Relative: 1 % (ref 0.0–3.0)
Eosinophils Absolute: 0.2 10*3/uL (ref 0.0–0.7)
Eosinophils Relative: 2.6 % (ref 0.0–5.0)
HCT: 44.1 % (ref 39.0–52.0)
Hemoglobin: 14.4 g/dL (ref 13.0–17.0)
Lymphocytes Relative: 21 % (ref 12.0–46.0)
Lymphs Abs: 1.4 10*3/uL (ref 0.7–4.0)
MCHC: 32.6 g/dL (ref 30.0–36.0)
MCV: 91.8 fl (ref 78.0–100.0)
Monocytes Absolute: 0.6 10*3/uL (ref 0.1–1.0)
Monocytes Relative: 9.1 % (ref 3.0–12.0)
Neutro Abs: 4.5 10*3/uL (ref 1.4–7.7)
Neutrophils Relative %: 66.3 % (ref 43.0–77.0)
Platelets: 223 10*3/uL (ref 150.0–400.0)
RBC: 4.8 Mil/uL (ref 4.22–5.81)
RDW: 14.7 % (ref 11.5–15.5)
WBC: 6.8 10*3/uL (ref 4.0–10.5)

## 2022-02-09 LAB — PSA: PSA: 0.89 ng/mL (ref 0.10–4.00)

## 2022-02-09 LAB — MICROALBUMIN / CREATININE URINE RATIO
Creatinine,U: 46.7 mg/dL
Microalb Creat Ratio: 1.5 mg/g (ref 0.0–30.0)
Microalb, Ur: <0.7 mg/dL (ref 0.0–1.9)

## 2022-02-09 LAB — HEMOGLOBIN A1C: Hgb A1c MFr Bld: 6.3 % (ref 4.6–6.5)

## 2022-02-09 NOTE — Telephone Encounter (Signed)
Patient notified of update  and verbalized understanding. 

## 2022-02-09 NOTE — Telephone Encounter (Signed)
Patient returned call for lab results.  

## 2022-02-22 ENCOUNTER — Other Ambulatory Visit: Payer: Self-pay | Admitting: Adult Health

## 2022-02-22 DIAGNOSIS — E785 Hyperlipidemia, unspecified: Secondary | ICD-10-CM

## 2022-03-03 DIAGNOSIS — M19012 Primary osteoarthritis, left shoulder: Secondary | ICD-10-CM | POA: Diagnosis not present

## 2022-03-03 DIAGNOSIS — M19011 Primary osteoarthritis, right shoulder: Secondary | ICD-10-CM | POA: Diagnosis not present

## 2022-03-10 ENCOUNTER — Telehealth: Payer: Self-pay | Admitting: Adult Health

## 2022-03-10 DIAGNOSIS — R361 Hematospermia: Secondary | ICD-10-CM

## 2022-03-10 NOTE — Telephone Encounter (Signed)
Please advise labs from that visit were normal

## 2022-03-10 NOTE — Telephone Encounter (Signed)
Pt experiencing symptoms similar to his last visit on 02/12/22. Requesting a referral to urologist Alexis Frock

## 2022-03-11 NOTE — Telephone Encounter (Signed)
Referral placed! Pt notified of update.  

## 2022-03-11 NOTE — Addendum Note (Signed)
Addended by: Gwenyth Ober R on: 03/11/2022 10:02 AM   Modules accepted: Orders

## 2022-03-15 ENCOUNTER — Telehealth: Payer: Self-pay | Admitting: Adult Health

## 2022-03-15 NOTE — Telephone Encounter (Signed)
Left message for patient to call back and schedule Medicare Annual Wellness Visit (AWV) either virtually or in office. Left  my Tyler Greer number [phone]   Last AWV 04/19/21 please schedule with Nurse Health Adviser   45 min for awv-i  in office appointments 30 min for awv-s & awv-i phone/virtual appointments

## 2022-03-28 DIAGNOSIS — M25561 Pain in right knee: Secondary | ICD-10-CM | POA: Diagnosis not present

## 2022-03-28 DIAGNOSIS — M1711 Unilateral primary osteoarthritis, right knee: Secondary | ICD-10-CM | POA: Diagnosis not present

## 2022-04-13 DIAGNOSIS — R351 Nocturia: Secondary | ICD-10-CM | POA: Diagnosis not present

## 2022-04-13 DIAGNOSIS — N401 Enlarged prostate with lower urinary tract symptoms: Secondary | ICD-10-CM | POA: Diagnosis not present

## 2022-04-13 DIAGNOSIS — R361 Hematospermia: Secondary | ICD-10-CM | POA: Diagnosis not present

## 2022-04-20 ENCOUNTER — Ambulatory Visit (INDEPENDENT_AMBULATORY_CARE_PROVIDER_SITE_OTHER): Payer: Medicare Other

## 2022-04-20 VITALS — Ht 71.0 in | Wt 205.0 lb

## 2022-04-20 DIAGNOSIS — Z0000 Encounter for general adult medical examination without abnormal findings: Secondary | ICD-10-CM | POA: Diagnosis not present

## 2022-04-20 NOTE — Progress Notes (Signed)
Subjective:   Tyler Greer is a 87 y.o. male who presents for Medicare Annual/Subsequent preventive examination.  Review of Systems    Virtual Visit via Telephone Note  I connected with  Tyler Greer on 04/20/22 at 10:15 AM EST by telephone and verified that I am speaking with the correct person using two identifiers.  Location: Patient: Home Provider: Office Persons participating in the virtual visit: patient/Nurse Health Advisor   I discussed the limitations, risks, security and privacy concerns of performing an evaluation and management service by telephone and the availability of in person appointments. The patient expressed understanding and agreed to proceed.  Interactive audio and video telecommunications were attempted between this nurse and patient, however failed, due to patient having technical difficulties OR patient did not have access to video capability.  We continued and completed visit with audio only.  Some vital signs may be absent or patient reported.   Criselda Peaches, LPN  Cardiac Risk Factors include: advanced age (>5mn, >>65women);dyslipidemia;diabetes mellitus;male gender     Objective:    Today's Vitals   04/20/22 1020  Weight: 205 lb (93 kg)  Height: '5\' 11"'$  (1.803 m)   Body mass index is 28.59 kg/m.     04/20/2022   10:32 AM 04/19/2021    8:20 AM 04/16/2020    9:56 AM 09/09/2016    6:38 PM 07/01/2015    1:11 PM 06/30/2015   10:24 AM 06/10/2015    9:29 PM  Advanced Directives  Does Patient Have a Medical Advance Directive? No No No No No No No  Would patient like information on creating a medical advance directive? No - Patient declined No - Patient declined No - Patient declined  No - patient declined information No - patient declined information No - patient declined information    Current Medications (verified) Outpatient Encounter Medications as of 04/20/2022  Medication Sig   Cyanocobalamin (B-12) 100 MCG TABS B12    fluorometholone (FML) 0.1 % ophthalmic suspension Place into the left eye.   HYDROcodone-acetaminophen (NORCO/VICODIN) 5-325 MG tablet Take 1-2 tablets by mouth every 6 (six) hours as needed. (Patient not taking: Reported on 04/20/2022)   hydrocortisone (ANUSOL-HC) 25 MG suppository Place 1 suppository (25 mg total) rectally 2 (two) times daily.   metFORMIN (GLUCOPHAGE-XR) 500 MG 24 hr tablet TAKE ONE TABLET DAILY WITH BREAKFAST   Multiple Vitamins-Minerals (ZINC PO) Take by mouth.   neomycin-polymyxin b-dexamethasone (MAXITROL) 3.5-10000-0.1 SUSP Place 1 drop into the left eye 2 (two) times daily.   neomycin-polymyxin-dexameth (MAXITROL) 0.1 % OINT    neomycin-polymyxin-dexamethasone (MAXITROL) 0.1 % ophthalmic suspension neomycin-polymyxin-dexameth 3.5 mg/mL-10,000 unit/mL-0.1% eye drops   rosuvastatin (CRESTOR) 20 MG tablet Take 1 tablet (20 mg total) by mouth daily. (Patient not taking: Reported on 04/20/2022)   Zinc 100 MG TABS zinc   No facility-administered encounter medications on file as of 04/20/2022.    Allergies (verified) Patient has no known allergies.   History: Past Medical History:  Diagnosis Date   Arthritis    "hands and knees" (06/10/2015)   Basal cell carcinoma, arm    "both"   Basal cell carcinoma, face    Chest pain    Diabetes mellitus type II 10/23/2007   Intention tremor    Migraine    "none in years" (06/10/2015)   Osteoarthritis    Paresthesia of foot    bilateral   Stroke ([place] 02/22/2003   mini stroke   Past Surgical History:  Procedure Laterality Date  HAMMER TOE SURGERY Bilateral    JOINT REPLACEMENT     KNEE ARTHROPLASTY Left 06/10/2015   Procedure: COMPUTER ASSISTED TOTAL KNEE ARTHROPLASTY;  Surgeon: Marybelle Killings, MD;  Location: [facility];  Service: Orthopedics;  Laterality: Left;   KNEE ARTHROSCOPY Bilateral 02/22/2000   LUMBAR LAMINECTOMY  X 2   TONSILLECTOMY     Family History  Problem Relation Age of Onset   Heart disease Father         died age 6-MI   Diabetes type II Brother    Melanoma Brother    Social History   Socioeconomic History   Marital status: Widowed    Spouse name: Not on file   Number of children: 2   Years of education: Not on file   Highest education level: Not on file  Occupational History   Occupation: Retired-Contractor  Tobacco Use   Smoking status: Never   Smokeless tobacco: Never   Tobacco comments:    "might have smoked 1 pack of cigarettes in my life"  Substance and Sexual Activity   Alcohol use: No   Drug use: No   Sexual activity: Not Currently  Other Topics Concern   Not on file  Social History Narrative   Retired Clinical biochemist    Social Determinants of Health   Financial Resource Strain: [place]  (04/20/2022)   Overall Financial Resource Strain (CARDIA)    Difficulty of Paying Living Expenses: Not hard at all  Food Insecurity: No Food Insecurity (04/20/2022)   Hunger Vital Sign    Worried About Running Out of Food in the Last Year: Never true    [place] in the Last Year: Never true  Transportation Needs: No Transportation Needs (04/20/2022)   PRAPARE - Transportation    Lack of Transportation (Medical): No    Lack of Transportation (Non-Medical): No  Physical Activity: Sufficiently Active (04/20/2022)   Exercise Vital Sign    Days of Exercise per Week: 5 days    Minutes of Exercise per Session: 40 min  Stress: No Stress Concern Present (04/20/2022)   [place]    Feeling of Stress : Not at all  Social Connections: Socially Isolated (04/20/2022)   Social Connection and Isolation Panel [NHANES]    Frequency of Communication with Friends and Family: More than three times a week    Frequency of Social Gatherings with Friends and Family: More than three times a week    Attends Religious Services: Never    Marine scientist or Organizations: No    Attends Archivist Meetings: Never     Marital Status: Widowed    Tobacco Counseling Counseling given: Not Answered Tobacco comments: "might have smoked 1 pack of cigarettes in my life"   Clinical Intake:  Pre-visit preparation completed: Yes  Pain : No/denies pain     BMI - recorded: 28.59 Nutritional Status: BMI 25 -29 Overweight Nutritional Risks: None Diabetes: Yes CBG done?: No Did pt. bring in CBG monitor from home?: No  How often do you need to have someone help you when you read instructions, pamphlets, or other written materials from your doctor or pharmacy?: 1 - Never  Diabetic? Yes   Interpreter Needed?: No  Information entered by :: Rolene Arbour LPN   Activities of Daily Living    04/20/2022   10:30 AM  In your present state of health, do you have any difficulty performing the following activities:  Hearing?  0  Vision? 0  Difficulty concentrating or making decisions? 0  Walking or climbing stairs? 0  Dressing or bathing? 0  Doing errands, shopping? 0  Preparing Food and eating ? N  Using the Toilet? N  In the past six months, have you accidently leaked urine? Y  Comment Followed by PCP and Urologist  Do you have problems with loss of bowel control? N  Managing your Medications? N  Managing your Finances? N  Housekeeping or managing your Housekeeping? N    Patient Care Team: Dorothyann Peng, NP as PCP - General (Family Medicine) Burnell Blanks, MD as PCP - Cardiology (Cardiology)  Indicate any recent Medical Services you may have received from other than [facility] providers in the past year (date may be approximate).     Assessment:   This is a routine wellness examination for Tyler Greer.  Hearing/Vision screen Hearing Screening - Comments:: Wears rx glasses - up to date with routine eye exams with   Vision Screening - Comments:: Wears rx glasses - up to date with routine eye exams with  [facility]  Dietary issues and exercise activities discussed: Exercise limited  by: None identified   Goals Addressed               This Visit's Progress     No current goals (pt-stated)         Depression Screen    04/20/2022   10:29 AM 04/19/2021    8:17 AM 01/26/2021    7:16 AM 04/16/2020    9:59 AM 08/08/2019    9:31 AM 08/08/2019    9:12 AM 06/30/2015   10:27 AM  PHQ 2/9 Scores  PHQ - 2 Score 0 2 3 0 0 0 0  PHQ- 9 Score  4 8        Fall Risk    04/20/2022   10:31 AM 04/19/2021    8:21 AM 01/26/2021    7:17 AM 04/16/2020    9:58 AM 08/08/2019    9:31 AM  Fall Risk   Falls in the past year? 0 0 0 0 0  Number falls in past yr: 0 0  0   Injury with Fall? 0 0  0   Risk for fall due to : No Fall Risks No Fall Risks  No Fall Risks   Follow up Falls prevention discussed Falls evaluation completed  Falls evaluation completed;Falls prevention discussed     FALL RISK PREVENTION PERTAINING TO THE HOME:  Any stairs in or around the home? Yes  If so, are there any without handrails? No  Home free of loose throw rugs in walkways, pet beds, electrical cords, etc? Yes  Adequate lighting in your home to reduce risk of falls? Yes   ASSISTIVE DEVICES UTILIZED TO PREVENT FALLS:  Life alert? No  Use of a cane, walker or w/c? No  Grab bars in the bathroom? Yes Shower chair or bench in shower? Yes  Elevated toilet seat or a handicapped toilet? Yes   TIMED UP AND GO:  Was the test performed? No . Audio Visit   Cognitive Function:        04/20/2022   10:32 AM  6CIT Screen  What Year? 0 points  What month? 0 points  What time? 0 points  Count back from 20 0 points  Months in reverse 0 points  Repeat phrase 0 points  Total Score 0 points    Immunizations Immunization History  Administered Date(s) Administered  Fluad Quad(high Dose 65+) 12/29/2018, 01/07/2020, 12/25/2020, 12/27/2021   Influenza Split 11/26/2010, 11/30/2011   Influenza Whole 12/05/2007, 11/19/2008, 11/10/2009   Influenza, High Dose Seasonal PF 12/01/2014, 12/11/2015, 12/15/2016,  12/20/2017   Influenza,inj,Quad PF,6+ Mos 11/28/2012, 11/20/2013   Influenza,inj,quad, With Preservative 11/10/2016   PFIZER(Purple Top)SARS-COV-2 Vaccination 03/12/2019, 04/02/2019, 11/22/2019   Pneumococcal Conjugate-13 02/24/2014   Pneumococcal Polysaccharide-23 11/26/2010   Tdap 11/26/2010   Zoster Recombinat (Shingrix) 04/04/2020    TDAP status: Due, Education has been provided regarding the importance of this vaccine. Advised may receive this vaccine at local pharmacy or Health Dept. Aware to provide a copy of the vaccination record if obtained from local pharmacy or Health Dept. Verbalized acceptance and understanding.  Flu Vaccine status: Up to date  Pneumococcal vaccine status: Up to date  Covid-19 vaccine status: Completed vaccines  Qualifies for Shingles Vaccine? Yes   Zostavax completed Yes   Shingrix Completed?: Yes  Screening Tests Health Maintenance  Topic Date Due   DTaP/Tdap/Td (2 - Td or Tdap) 11/25/2020   FOOT EXAM  04/21/2022 (Originally 08/07/2020)   COVID-19 Vaccine (4 - 2023-24 season) 05/06/2022 (Originally 10/22/2021)   OPHTHALMOLOGY EXAM  06/11/2022   Diabetic kidney evaluation - eGFR measurement  07/13/2022   HEMOGLOBIN A1C  08/10/2022   Diabetic kidney evaluation - Urine ACR  02/09/2023   Medicare Annual Wellness (AWV)  04/21/2023   Pneumonia Vaccine 27+ Years old  Completed   INFLUENZA VACCINE  Completed   HPV VACCINES  Aged Out   Zoster Vaccines- Shingrix  Discontinued    Health Maintenance  Health Maintenance Due  Topic Date Due   DTaP/Tdap/Td (2 - Td or Tdap) 11/25/2020    Colorectal cancer screening: No longer required.   Lung Cancer Screening: (Low Dose CT Chest recommended if Age 3-80 years, 30 pack-year currently smoking OR have quit w/in 15years.)  qualify.     Additional Screening:  Hepatitis C Screening: does not qualify; Completed   Vision Screening: Recommended annual ophthalmology exams for early detection of glaucoma and  other disorders of the eye. Is the patient up to date with their annual eye exam?  Yes  Who is the provider or what is the name of the office in which the patient attends annual eye exams?  [facility] If pt is not established with a provider, would they like to be referred to a provider to establish care? No .   Dental Screening: Recommended annual dental exams for proper oral hygiene  Community Resource Referral / Chronic Care Management:  CRR required this visit?  No   CCM required this visit?  No      Plan:     I have personally reviewed and noted the following in the patient's chart:   Medical and social history Use of alcohol, tobacco or illicit drugs  Current medications and supplements including opioid prescriptions. Patient is currently no taking opioids Functional ability and status Nutritional status Physical activity Advanced directives List of other physicians Hospitalizations, surgeries, and ER visits in previous 12 months Vitals Screenings to include cognitive, depression, and falls Referrals and appointments  In addition, I have reviewed and discussed with patient certain preventive protocols, quality metrics, and best practice recommendations. A written personalized care plan for preventive services as well as general preventive health recommendations were provided to patient.     Criselda Peaches, LPN   [id number]   Nurse Notes: None

## 2022-04-20 NOTE — Patient Instructions (Addendum)
Tyler Greer , Thank you for taking time to come for your Medicare Wellness Visit. I appreciate your ongoing commitment to your health goals. Please review the following plan we discussed and let me know if I can assist you in the future.   These are the goals we discussed:  Goals       DIET - EAT MORE FRUITS AND VEGETABLES      No current goals (pt-stated)      Patient Stated      I will continue to walk a mile 1 mile 5 days a week.        This is a list of the screening recommended for you and due dates:  Health Maintenance  Topic Date Due   DTaP/Tdap/Td vaccine (2 - Td or Tdap) 11/25/2020   Complete foot exam   04/21/2022*   COVID-19 Vaccine (4 - 2023-24 season) 05/06/2022*   Eye exam for diabetics  06/11/2022   Yearly kidney function blood test for diabetes  07/13/2022   Hemoglobin A1C  08/10/2022   Yearly kidney health urinalysis for diabetes  02/09/2023   Medicare Annual Wellness Visit  04/21/2023   Pneumonia Vaccine  Completed   Flu Shot  Completed   HPV Vaccine  Aged Out   Zoster (Shingles) Vaccine  Discontinued  *Topic was postponed. The date shown is not the original due date.     Advanced directives: Advance directive discussed with you today. Even though you declined this today, please call our office should you change your mind, and we can give you the proper paperwork for you to fill out.   Conditions/risks identified: None  Next appointment: Follow up in one year for your annual wellness visit.    Preventive Care 85 Years and Older, Male  Preventive care refers to lifestyle choices and visits with your health care provider that can promote health and wellness. What does preventive care include? A yearly physical exam. This is also called an annual well check. Dental exams once or twice a year. Routine eye exams. Ask your health care provider how often you should have your eyes checked. Personal lifestyle choices, including: Daily care of your teeth and  gums. Regular physical activity. Eating a healthy diet. Avoiding tobacco and drug use. Limiting alcohol use. Practicing safe sex. Taking low doses of aspirin every day. Taking vitamin and mineral supplements as recommended by your health care provider. What happens during an annual well check? The services and screenings done by your health care provider during your annual well check will depend on your age, overall health, lifestyle risk factors, and family history of disease. Counseling  Your health care provider may ask you questions about your: Alcohol use. Tobacco use. Drug use. Emotional well-being. Home and relationship well-being. Sexual activity. Eating habits. History of falls. Memory and ability to understand (cognition). Work and work Statistician. Screening  You may have the following tests or measurements: Height, weight, and BMI. Blood pressure. Lipid and cholesterol levels. These may be checked every 5 years, or more frequently if you are over 23 years old. Skin check. Lung cancer screening. You may have this screening every year starting at age 45 if you have a 30-pack-year history of smoking and currently smoke or have quit within the past 15 years. Fecal occult blood test (FOBT) of the stool. You may have this test every year starting at age 21. Flexible sigmoidoscopy or colonoscopy. You may have a sigmoidoscopy every 5 years or a colonoscopy every 10 years  starting at age 51. Prostate cancer screening. Recommendations will vary depending on your family history and other risks. Hepatitis C blood test. Hepatitis B blood test. Sexually transmitted disease (STD) testing. Diabetes screening. This is done by checking your blood sugar (glucose) after you have not eaten for a while (fasting). You may have this done every 1-3 years. Abdominal aortic aneurysm (AAA) screening. You may need this if you are a current or former smoker. Osteoporosis. You may be screened  starting at age 39 if you are at high risk. Talk with your health care provider about your test results, treatment options, and if necessary, the need for more tests. Vaccines  Your health care provider may recommend certain vaccines, such as: Influenza vaccine. This is recommended every year. Tetanus, diphtheria, and acellular pertussis (Tdap, Td) vaccine. You may need a Td booster every 10 years. Zoster vaccine. You may need this after age 26. Pneumococcal 13-valent conjugate (PCV13) vaccine. One dose is recommended after age 5. Pneumococcal polysaccharide (PPSV23) vaccine. One dose is recommended after age 71. Talk to your health care provider about which screenings and vaccines you need and how often you need them. This information is not intended to replace advice given to you by your health care provider. Make sure you discuss any questions you have with your health care provider. Document Released: 03/06/2015 Document Revised: 10/28/2015 Document Reviewed: 12/09/2014 Elsevier Interactive Patient Education  2017 [place] Prevention in the Home Falls can cause injuries. They can happen to people of all ages. There are many things you can do to make your home safe and to help prevent falls. What can I do on the outside of my home? Regularly fix the edges of walkways and driveways and fix any cracks. Remove anything that might make you trip as you walk through a door, such as a raised step or threshold. Trim any bushes or trees on the path to your home. Use bright outdoor lighting. Clear any walking paths of anything that might make someone trip, such as rocks or tools. Regularly check to see if handrails are loose or broken. Make sure that both sides of any steps have handrails. Any raised decks and porches should have guardrails on the edges. Have any leaves, snow, or ice cleared regularly. Use sand or salt on walking paths during winter. Clean up any spills in your garage  right away. This includes oil or grease spills. What can I do in the bathroom? Use night lights. Install grab bars by the toilet and in the tub and shower. Do not use towel bars as grab bars. Use non-skid mats or decals in the tub or shower. If you need to sit down in the shower, use a plastic, non-slip stool. Keep the floor dry. Clean up any water that spills on the floor as soon as it happens. Remove soap buildup in the tub or shower regularly. Attach bath mats securely with double-sided non-slip rug tape. Do not have throw rugs and other things on the floor that can make you trip. What can I do in the bedroom? Use night lights. Make sure that you have a light by your bed that is easy to reach. Do not use any sheets or blankets that are too big for your bed. They should not hang down onto the floor. Have a firm chair that has side arms. You can use this for support while you get dressed. Do not have throw rugs and other things on the floor that  can make you trip. What can I do in the kitchen? Clean up any spills right away. Avoid walking on wet floors. Keep items that you use a lot in easy-to-reach places. If you need to reach something above you, use a strong step stool that has a grab bar. Keep electrical cords out of the way. Do not use floor polish or wax that makes floors slippery. If you must use wax, use non-skid floor wax. Do not have throw rugs and other things on the floor that can make you trip. What can I do with my stairs? Do not leave any items on the stairs. Make sure that there are handrails on both sides of the stairs and use them. Fix handrails that are broken or loose. Make sure that handrails are as long as the stairways. Check any carpeting to make sure that it is firmly attached to the stairs. Fix any carpet that is loose or worn. Avoid having throw rugs at the top or bottom of the stairs. If you do have throw rugs, attach them to the floor with carpet tape. Make  sure that you have a light switch at the top of the stairs and the bottom of the stairs. If you do not have them, ask someone to add them for you. What else can I do to help prevent falls? Wear shoes that: Do not have high heels. Have rubber bottoms. Are comfortable and fit you well. Are closed at the toe. Do not wear sandals. If you use a stepladder: Make sure that it is fully opened. Do not climb a closed stepladder. Make sure that both sides of the stepladder are locked into place. Ask someone to hold it for you, if possible. Clearly mark and make sure that you can see: Any grab bars or handrails. First and last steps. Where the edge of each step is. Use tools that help you move around (mobility aids) if they are needed. These include: Canes. Walkers. Scooters. Crutches. Turn on the lights when you go into a dark area. Replace any light bulbs as soon as they burn out. Set up your furniture so you have a clear path. Avoid moving your furniture around. If any of your floors are uneven, fix them. If there are any pets around you, be aware of where they are. Review your medicines with your doctor. Some medicines can make you feel dizzy. This can increase your chance of falling. Ask your doctor what other things that you can do to help prevent falls. This information is not intended to replace advice given to you by your health care provider. Make sure you discuss any questions you have with your health care provider. Document Released: 12/04/2008 Document Revised: 07/16/2015 Document Reviewed: 03/14/2014 Elsevier Interactive Patient Education  2017 [employer].

## 2022-05-06 DIAGNOSIS — M1711 Unilateral primary osteoarthritis, right knee: Secondary | ICD-10-CM | POA: Diagnosis not present

## 2022-06-06 ENCOUNTER — Ambulatory Visit (INDEPENDENT_AMBULATORY_CARE_PROVIDER_SITE_OTHER): Payer: Medicare Other | Admitting: Family Medicine

## 2022-06-06 ENCOUNTER — Encounter: Payer: Self-pay | Admitting: Family Medicine

## 2022-06-06 VITALS — BP 138/80 | HR 85 | Temp 98.6°F | Wt 205.0 lb

## 2022-06-06 DIAGNOSIS — J019 Acute sinusitis, unspecified: Secondary | ICD-10-CM

## 2022-06-06 DIAGNOSIS — R0989 Other specified symptoms and signs involving the circulatory and respiratory systems: Secondary | ICD-10-CM | POA: Diagnosis not present

## 2022-06-06 LAB — POC COVID19 BINAXNOW: SARS Coronavirus 2 Ag: NEGATIVE

## 2022-06-06 MED ORDER — CEFUROXIME AXETIL 500 MG PO TABS: 500.0000 mg | ORAL_TABLET | Freq: Two times a day (BID) | ORAL | 0 refills | Status: AC

## 2022-06-06 NOTE — Progress Notes (Signed)
   Subjective:    Patient ID: Tyler Greer, male    DOB: [birth year], 87 y.o.   MRN: [id number]  HPI Here for one week of sinus congestion, blowing yellow mucus from the nose, PND, and a dry ough, he had a fever for a few days but this has resolved.    Review of Systems  Constitutional:  Positive for fever.  HENT:  Positive for congestion, postnasal drip, sinus pressure and sore throat. Negative for ear pain.   Eyes: Negative.   Respiratory:  Positive for cough. Negative for shortness of breath and wheezing.        Objective:   Physical Exam Constitutional:      Appearance: Normal appearance. He is not ill-appearing.  HENT:     Right Ear: Tympanic membrane, ear canal and external ear normal.     Left Ear: Tympanic membrane, ear canal and external ear normal.     Nose: Nose normal.     Mouth/Throat:     Pharynx: Oropharynx is clear.  Eyes:     Conjunctiva/sclera: Conjunctivae normal.  Pulmonary:     Effort: Pulmonary effort is normal.     Breath sounds: Normal breath sounds.  Lymphadenopathy:     Cervical: No cervical adenopathy.  Neurological:     Mental Status: He is alert.           Assessment & Plan:  Sinusitis, treat with 10 days of Cefuroxime.  Gershon Crane, MD

## 2022-06-09 DIAGNOSIS — S46912A Strain of unspecified muscle, fascia and tendon at shoulder and upper arm level, left arm, initial encounter: Secondary | ICD-10-CM | POA: Diagnosis not present

## 2022-06-14 ENCOUNTER — Telehealth: Payer: Self-pay | Admitting: Adult Health

## 2022-06-14 NOTE — Telephone Encounter (Signed)
Pt called to request a referral to see a Rheumatologist ASAP.  Pt states he is in so much pain, he can barely even put on a shirt.  LOV:  06/06/22 - Acute visit

## 2022-06-14 NOTE — Telephone Encounter (Signed)
Left message to return phone call. Need more information as to the location of the pain, if pt has seen rheumatology for this issue if so when was his last visit. Referrals cannot be placed with some insurances without an appt. From pt PCP about the issue.

## 2022-06-15 NOTE — Telephone Encounter (Signed)
Left message to return phone call.

## 2022-06-16 NOTE — Telephone Encounter (Signed)
Pt has been scheduled for an appt  °

## 2022-06-17 ENCOUNTER — Ambulatory Visit (INDEPENDENT_AMBULATORY_CARE_PROVIDER_SITE_OTHER): Payer: Medicare Other | Admitting: Adult Health

## 2022-06-17 ENCOUNTER — Encounter: Payer: Self-pay | Admitting: Adult Health

## 2022-06-17 VITALS — BP 130/82 | HR 91 | Temp 98.1°F | Ht 71.0 in | Wt 206.0 lb

## 2022-06-17 DIAGNOSIS — S29012A Strain of muscle and tendon of back wall of thorax, initial encounter: Secondary | ICD-10-CM | POA: Diagnosis not present

## 2022-06-17 MED ORDER — CYCLOBENZAPRINE HCL 10 MG PO TABS: 10.0000 mg | ORAL_TABLET | Freq: Three times a day (TID) | ORAL | 0 refills | Status: DC | PRN

## 2022-06-17 MED ORDER — METHYLPREDNISOLONE 4 MG PO TBPK: ORAL_TABLET | ORAL | 0 refills | Status: DC

## 2022-06-17 NOTE — Progress Notes (Signed)
Subjective:    Patient ID: Tyler Greer, male    DOB: 1935-11-22, 87 y.o.   MRN: [id number]  HPI 87 year old male who  has a past medical history of Arthritis, Basal cell carcinoma, arm, Basal cell carcinoma, face, Chest pain, Diabetes mellitus type II (10/23/2007), Intention tremor, Migraine, Osteoarthritis, Paresthesia of foot, and Stroke (HCC) (02/22/2003).  He presents to the office today for an acute issue.  He reports that roughly 3 weeks ago he had a lot of coughing and sneezing due to the pollen, he had a hard cough and then suddenly he had pain across his upper back.  Since that time the pain has continued.  Pain is worse with movements of his neck and arms.  Home he has tried topical pain relieving creams and gels without much relief.  Heating pad helps but does not fully resolve the pain.  This does not feel like the typical arthritis pain that has had in the past.  Denies chest pain or shortness of breath   Review of Systems See HPI   Past Medical History:  Diagnosis Date   Arthritis    "hands and knees" (06/10/2015)   Basal cell carcinoma, arm    "both"   Basal cell carcinoma, face    Chest pain    Diabetes mellitus type II 10/23/2007   Intention tremor    Migraine    "none in years" (06/10/2015)   Osteoarthritis    Paresthesia of foot    bilateral   Stroke (HCC) 02/22/2003   mini stroke    Social History   Socioeconomic History   Marital status: Widowed    Spouse name: Not on file   Number of children: 2   Years of education: Not on file   Highest education level: Not on file  Occupational History   Occupation: Retired-Contractor  Tobacco Use   Smoking status: Never   Smokeless tobacco: Never   Tobacco comments:    "might have smoked 1 pack of cigarettes in my life"  Substance and Sexual Activity   Alcohol use: No   Drug use: No   Sexual activity: Not Currently  Other Topics Concern   Not on file  Social History Narrative   Retired Scientist, research (physical sciences)    Social Determinants of Health   Financial Resource Strain: Low Risk  (04/20/2022)   Overall Financial Resource Strain (CARDIA)    Difficulty of Paying Living Expenses: Not hard at all  Food Insecurity: No Food Insecurity (04/20/2022)   Hunger Vital Sign    Worried About Running Out of Food in the Last Year: Never true    Ran Out of Food in the Last Year: Never true  Transportation Needs: No Transportation Needs (04/20/2022)   PRAPARE - Transportation    Lack of Transportation (Medical): No    Lack of Transportation (Non-Medical): No  Physical Activity: Sufficiently Active (04/20/2022)   Exercise Vital Sign    Days of Exercise per Week: 5 days    Minutes of Exercise per Session: 40 min  Stress: No Stress Concern Present (04/20/2022)   [employer] of Occupational Health - Occupational Stress Questionnaire    Feeling of Stress : Not at all  Social Connections: Socially Isolated (04/20/2022)   Social Connection and Isolation Panel [NHANES]    Frequency of Communication with Friends and Family: More than three times a week    Frequency of Social Gatherings with Friends and Family: More than three times a week  Attends Religious Services: Never    Active Member of Clubs or Organizations: No    Attends Banker Meetings: Never    Marital Status: Widowed  Intimate Partner Violence: Not At Risk (04/20/2022)   Humiliation, Afraid, Rape, and Kick questionnaire    Fear of Current or Ex-Partner: No    Emotionally Abused: No    Physically Abused: No    Sexually Abused: No    Past Surgical History:  Procedure Laterality Date   HAMMER TOE SURGERY Bilateral    JOINT REPLACEMENT     KNEE ARTHROPLASTY Left 06/10/2015   Procedure: COMPUTER ASSISTED TOTAL KNEE ARTHROPLASTY;  Surgeon: Eldred Manges, MD;  Location: [facility] OR;  Service: Orthopedics;  Laterality: Left;   KNEE ARTHROSCOPY Bilateral 02/22/2000   LUMBAR LAMINECTOMY  X 2   TONSILLECTOMY      Family History   Problem Relation Age of Onset   Heart disease Father        died age 40-MI   Diabetes type II Brother    Melanoma Brother     No Known Allergies  Current Outpatient Medications on File Prior to Visit  Medication Sig Dispense Refill   Cyanocobalamin (B-12) 100 MCG TABS B12     fluorometholone (FML) 0.1 % ophthalmic suspension Place into the left eye.     HYDROcodone-acetaminophen (NORCO/VICODIN) 5-325 MG tablet Take 1-2 tablets by mouth every 6 (six) hours as needed. 25 tablet 0   hydrocortisone (ANUSOL-HC) 25 MG suppository Place 1 suppository (25 mg total) rectally 2 (two) times daily. 12 suppository 0   metFORMIN (GLUCOPHAGE-XR) 500 MG 24 hr tablet TAKE ONE TABLET DAILY WITH BREAKFAST 180 tablet 1   Multiple Vitamins-Minerals (ZINC PO) Take by mouth.     neomycin-polymyxin b-dexamethasone (MAXITROL) 3.5-10000-0.1 SUSP Place 1 drop into the left eye 2 (two) times daily.     neomycin-polymyxin-dexameth (MAXITROL) 0.1 % OINT      neomycin-polymyxin-dexamethasone (MAXITROL) 0.1 % ophthalmic suspension neomycin-polymyxin-dexameth 3.5 mg/mL-10,000 unit/mL-0.1% eye drops     rosuvastatin (CRESTOR) 20 MG tablet Take 1 tablet (20 mg total) by mouth daily. 90 tablet 1   Zinc 100 MG TABS zinc     No current facility-administered medications on file prior to visit.    BP 130/82   Pulse 91   Temp 98.1 F (36.7 C) (Oral)   Ht 5\' 11"  (1.803 m)   Wt 206 lb (93.4 kg)   SpO2 97%   BMI 28.73 kg/m       Objective:   Physical Exam Vitals and nursing note reviewed.  Constitutional:      Appearance: Normal appearance.  Cardiovascular:     Rate and Rhythm: Normal rate and regular rhythm.     Pulses: Normal pulses.     Heart sounds: Normal heart sounds.  Pulmonary:     Effort: Pulmonary effort is normal.     Breath sounds: Normal breath sounds.  Musculoskeletal:        General: Tenderness present.     Right shoulder: Tenderness present. No bony tenderness. Decreased range of motion.      Left shoulder: Tenderness present. No bony tenderness. Decreased range of motion.     Cervical back: Torticollis present. No swelling, edema or bony tenderness. Pain with movement and muscular tenderness present. No spinous process tenderness.       Back:     Comments: He has tenderness with palpation throughout rhomboid muscles bilaterally  Skin:    General: Skin is warm and dry.  Neurological:     General: No focal deficit present.     Mental Status: He is alert and oriented to person, place, and time.  Psychiatric:        Mood and Affect: Mood normal.        Behavior: Behavior normal.        Thought Content: Thought content normal.        Judgment: Judgment normal.       Assessment & Plan:  1. Strain of rhomboid muscle, initial encounter - appears to be muscular in nature.  - Continue to use heating pad - Will send in Flexeril amd Medrol dose pack - he understands that Flexeril can be sedating  - cyclobenzaprine (FLEXERIL) 10 MG tablet; Take 1 tablet (10 mg total) by mouth 3 (three) times daily as needed for muscle spasms.  Dispense: 15 tablet; Refill: 0 - methylPREDNISolone (MEDROL DOSEPAK) 4 MG TBPK tablet; Take as directed  Dispense: 21 tablet; Refill: 0  Shirline Frees, NP  Time spent with patient today was 32 minutes which consisted of chart review, discussing muscle strain and back pain,work up, treatment answering questions and documentation.

## 2022-07-11 DIAGNOSIS — R351 Nocturia: Secondary | ICD-10-CM | POA: Diagnosis not present

## 2022-07-11 DIAGNOSIS — N401 Enlarged prostate with lower urinary tract symptoms: Secondary | ICD-10-CM | POA: Diagnosis not present

## 2022-07-30 ENCOUNTER — Encounter (HOSPITAL_COMMUNITY): Payer: Self-pay

## 2022-07-30 ENCOUNTER — Ambulatory Visit (HOSPITAL_COMMUNITY)
Admission: EM | Admit: 2022-07-30 | Discharge: 2022-07-30 | Disposition: A | Discharge status: Home / Self Care | Payer: Medicare Other | Attending: Emergency Medicine | Admitting: Emergency Medicine

## 2022-07-30 DIAGNOSIS — S51832A Puncture wound without foreign body of left forearm, initial encounter: Secondary | ICD-10-CM | POA: Diagnosis not present

## 2022-07-30 MED ORDER — CEPHALEXIN 500 MG PO CAPS: 500.0000 mg | ORAL_CAPSULE | Freq: Two times a day (BID) | ORAL | 0 refills | Status: AC

## 2022-07-30 NOTE — Discharge Instructions (Signed)
Wound has been evaluated, has closed the skin is much as possible with tissue adhesive, should heal over the next week  Arm has been wrapped in a compression dressing to help stop the bleeding, may remove tomorrow evening  May cleanse daily with soap and water, pat and do not rub then may apply a nonstick bandage to keep clean and to prevent it from rubbing against clothing and fabric  Take cephalexin every morning and every evening for 5 days which is an antibiotic to keep area from becoming infected  You may follow-up with his urgent care as needed for any concerns regarding the healing

## 2022-07-30 NOTE — ED Provider Notes (Signed)
[facility]    CSN: [id number] Arrival date & time: 07/30/22  1506      History   Chief Complaint Chief Complaint  Patient presents with   Puncture Wound    Left forearm    HPI Tyler Greer is a 87 y.o. male.   Patient presents for evaluation of a wound to the left forearm that occurred today.  Was working on a truck when arm was hit on the side by a rusty bolt causing skin to puncture.  Has been wrapped by a neighbor but currently bleeding.  Last known tetanus 2023.  Past Medical History:  Diagnosis Date   Arthritis    "hands and knees" (06/10/2015)   Basal cell carcinoma, arm    "both"   Basal cell carcinoma, face    Chest pain    Diabetes mellitus type II 10/23/2007   Intention tremor    Migraine    "none in years" (06/10/2015)   Osteoarthritis    Paresthesia of foot    bilateral   Stroke (HCC) 02/22/2003   mini stroke    Patient Active Problem List   Diagnosis Date Noted   Arthritis 02/11/2021   Pain in joint of left knee 09/12/2020   Acute pain of right wrist 03/05/2019   Pain in joint of right elbow 02/26/2019   Pain in joint of right shoulder 01/16/2019   DDD (degenerative disc disease), cervical 08/30/2018   Pseudogout 07/22/2016   Diabetes mellitus with neuropathy (HCC) 06/30/2015   Status post total left knee replacement 06/10/2015   Actinic keratosis 11/06/2013   Diabetic neuropathy (HCC) 08/28/2013   Testosterone deficiency 07/27/2010   Dyslipidemia 07/27/2010   ACTION TREMOR 12/07/2006   Osteoarthritis 12/07/2006    Past Surgical History:  Procedure Laterality Date   HAMMER TOE SURGERY Bilateral    JOINT REPLACEMENT     KNEE ARTHROPLASTY Left 06/10/2015   Procedure: COMPUTER ASSISTED TOTAL KNEE ARTHROPLASTY;  Surgeon: Eldred Manges, MD;  Location: [facility];  Service: Orthopedics;  Laterality: Left;   KNEE ARTHROSCOPY Bilateral 02/22/2000   LUMBAR LAMINECTOMY  X 2   TONSILLECTOMY         Home Medications    Prior to  Admission medications   Medication Sig Start Date End Date Taking? Authorizing Provider  cephALEXin (KEFLEX) 500 MG capsule Take 1 capsule (500 mg total) by mouth 2 (two) times daily for 5 days. 07/30/22 08/04/22 Yes Finn Amos, Elita Boone, NP  metFORMIN (GLUCOPHAGE-XR) 500 MG 24 hr tablet TAKE ONE TABLET DAILY WITH BREAKFAST 02/22/22  Yes Nafziger, Kandee Keen, NP  Zinc 100 MG TABS zinc   Yes [provider]  Cyanocobalamin (B-12) 100 MCG TABS B12    [provider]  cyclobenzaprine (FLEXERIL) 10 MG tablet Take 1 tablet (10 mg total) by mouth 3 (three) times daily as needed for muscle spasms. 06/17/22   Nafziger, Kandee Keen, NP  fluorometholone (FML) 0.1 % ophthalmic suspension Place into the left eye. 03/25/21   [provider]  HYDROcodone-acetaminophen (NORCO/VICODIN) 5-325 MG tablet Take 1-2 tablets by mouth every 6 (six) hours as needed. 08/02/21   Vivi Barrack, DPM  hydrocortisone (ANUSOL-HC) 25 MG suppository Place 1 suppository (25 mg total) rectally 2 (two) times daily. 05/21/21   Nafziger, Kandee Keen, NP  methylPREDNISolone (MEDROL DOSEPAK) 4 MG TBPK tablet Take as directed 06/17/22   Shirline Frees, NP  Multiple Vitamins-Minerals (ZINC PO) Take by mouth.    [provider]  neomycin-polymyxin b-dexamethasone (MAXITROL) 3.5-10000-0.1 SUSP Place 1  drop into the left eye 2 (two) times daily. 03/10/21   [provider]  neomycin-polymyxin-dexameth (MAXITROL) 0.1 % OINT     [provider]  neomycin-polymyxin-dexamethasone (MAXITROL) 0.1 % ophthalmic suspension neomycin-polymyxin-dexameth 3.5 mg/mL-10,000 unit/mL-0.1% eye drops    [provider]  rosuvastatin (CRESTOR) 20 MG tablet Take 1 tablet (20 mg total) by mouth daily. 04/01/20   Nafziger, Kandee Keen, NP    Family History Family History  Problem Relation Age of Onset   Heart disease Father        died age 60-MI   Diabetes type II Brother    Melanoma Brother     Social History Social History    Tobacco Use   Smoking status: Never   Smokeless tobacco: Never   Tobacco comments:    "might have smoked 1 pack of cigarettes in my life"  Substance Use Topics   Alcohol use: No   Drug use: No     Allergies   Patient has no known allergies.   Review of Systems Review of Systems   Physical Exam Triage Vital Signs ED Triage Vitals  Enc Vitals Group     BP 07/30/22 1536 134/71     Pulse Rate 07/30/22 1536 81     Resp 07/30/22 1536 17     Temp 07/30/22 1536 98.3 F (36.8 C)     Temp Source 07/30/22 1536 Oral     SpO2 07/30/22 1536 97 %     Weight 07/30/22 1535 210 lb (95.3 kg)     Height 07/30/22 1535 5\' 11"  (1.803 m)     Head Circumference --      Peak Flow --      Pain Score 07/30/22 1534 6     Pain Loc --      Pain Edu? --      Excl. in GC? --    No data found.  Updated Vital Signs BP 134/71 (BP Location: Right Arm)   Pulse 81   Temp 98.3 F (36.8 C) (Oral)   Resp 17   Ht 5\' 11"  (1.803 m)   Wt 210 lb (95.3 kg)   SpO2 97%   BMI 29.29 kg/m   Visual Acuity Right Eye Distance:   Left Eye Distance:   Bilateral Distance:    Right Eye Near:   Left Eye Near:    Bilateral Near:     Physical Exam Constitutional:      Appearance: Normal appearance.  Eyes:     Extraocular Movements: Extraocular movements intact.  Pulmonary:     Effort: Pulmonary effort is normal.  Skin:    Comments: 2 x 3 skin tear to the left forearm with 1 cm puncture, bleeding  Neurological:     Mental Status: He is alert and oriented to person, place, and time. Mental status is at baseline.      UC Treatments / Results  Labs (all labs ordered are listed, but only abnormal results are displayed) Labs Reviewed - No data to display  EKG   Radiology No results found.  Procedures Wound Care  Date/Time: 07/30/2022 4:31 PM  Performed by: Valinda Hoar, NP Authorized by: Valinda Hoar, NP   Consent:    Consent obtained:  Verbal   Consent given by:  Patient    Risks discussed:  Bleeding and infection Universal protocol:    Procedure explained and questions answered to patient or proxy's satisfaction: no     Patient identity confirmed:  Verbally with patient Anesthesia:  Anesthesia method:  None Procedure details:    Indications: open wounds     Wound location:  Arm   Shoulder/arm location:  L lower arm   Wound age (days):  <1 Dressing:    Dressing applied:  Telfa pad   Wrapped with:  Coban 1 inch Post-procedure details:    Procedure completion:  Tolerated Comments:     Cleansed with chlorhexidine, skin unrolled to cover wound bed, adhered with skin adhesive  (including critical care time)  Medications Ordered in UC Medications - No data to display  Initial Impression / Assessment and Plan / UC Course  I have reviewed the triage vital signs and the nursing notes.  Pertinent labs & imaging results that were available during my care of the patient were reviewed by me and considered in my medical decision making (see chart for details).  Puncture wound of left forearm, initial encounter  Treated with skin adhesive, tolerated well, cover with a nonadherent dressing, advised to leave in place for 24 hours, would like continuous pressure to help stop bleeding, denies use of blood thinners, advised daily cleansing with soap and water and keeping covered with a nonstick Band-Aid to prevent contamination or friction of the skin, cephalexin prescribed prophylactically, given strict precautions to follow-up with his urgent care for any concerns regarding healing  Final Clinical Impressions(s) / UC Diagnoses   Final diagnoses:  Puncture wound of left upper extremity with complication, initial encounter     Discharge Instructions      Wound has been evaluated, has closed the skin is much as possible with tissue adhesive, should heal over the next week  Arm has been wrapped in a compression dressing to help stop the bleeding, may remove  tomorrow evening  May cleanse daily with soap and water, pat and do not rub then may apply a nonstick bandage to keep clean and to prevent it from rubbing against clothing and fabric  Take cephalexin every morning and every evening for 5 days which is an antibiotic to keep area from becoming infected  You may follow-up with his urgent care as needed for any concerns regarding the healing   ED Prescriptions     Medication Sig Dispense Auth. Provider   cephALEXin (KEFLEX) 500 MG capsule Take 1 capsule (500 mg total) by mouth 2 (two) times daily for 5 days. 10 capsule Valinda Hoar, NP      PDMP not reviewed this encounter.   Valinda Hoar, NP 07/30/22 1635

## 2022-07-30 NOTE — ED Triage Notes (Signed)
Patient was pulling something off of a pick up pickup truck and and there was a rusty bolt on top of the pick up truck and hit arm the bolt punctured his arm and tore his skin. His last Tetanus vaccine was last year.

## 2022-09-20 ENCOUNTER — Encounter: Payer: Self-pay | Admitting: Adult Health

## 2022-09-20 ENCOUNTER — Ambulatory Visit (INDEPENDENT_AMBULATORY_CARE_PROVIDER_SITE_OTHER): Payer: Medicare Other | Admitting: Adult Health

## 2022-09-20 VITALS — BP 140/80 | HR 79 | Temp 98.4°F | Ht 71.0 in | Wt 203.0 lb

## 2022-09-20 DIAGNOSIS — E785 Hyperlipidemia, unspecified: Secondary | ICD-10-CM

## 2022-09-20 DIAGNOSIS — Z7984 Long term (current) use of oral hypoglycemic drugs: Secondary | ICD-10-CM | POA: Diagnosis not present

## 2022-09-20 DIAGNOSIS — E0840 Diabetes mellitus due to underlying condition with diabetic neuropathy, unspecified: Secondary | ICD-10-CM

## 2022-09-20 LAB — POCT GLYCOSYLATED HEMOGLOBIN (HGB A1C): Hemoglobin A1C: 6.1 % — AB (ref 4.0–5.6)

## 2022-09-20 MED ORDER — METFORMIN HCL ER 500 MG PO TB24
ORAL_TABLET | ORAL | 1 refills | Status: DC
Start: 2022-09-20 — End: 2023-07-11

## 2022-09-20 NOTE — Patient Instructions (Addendum)
It was great seeing you today   Your A1c was 6.1   You can try using Metamucil or Benifiber in the morning, this can help you feel fuller longer.   Premier Protein shakes can also curb appetitive   Please follow up after December 19th for your annual exam

## 2022-09-20 NOTE — Progress Notes (Signed)
Subjective:    Patient ID: Tyler Greer, male    DOB: 1935/11/04, 87 y.o.   MRN: [id number]  Diabetes   87 year old male who  has a past medical history of Arthritis, Basal cell carcinoma, arm, Basal cell carcinoma, face, Chest pain, Diabetes mellitus type II (10/23/2007), Intention tremor, Migraine, Osteoarthritis, Paresthesia of foot, and Stroke (HCC) (02/22/2003).  He presents to the office today for follow up regarding DM Type 2. He is currently maintained on Metformin 500 mg daily XR. He has been tolerating this medication well. He has not had any hypoglycemic events. He is walking about 1.5 miles x 5 days a week. He feels as though he is eating a lot more   Lab Results  Component Value Date   HGBA1C 6.1 (A) 09/20/2022   Wt Readings from Last 3 Encounters:  09/20/22 203 lb (92.1 kg)  07/30/22 210 lb (95.3 kg)  06/17/22 206 lb (93.4 kg)    Review of Systems See HPI   Past Medical History:  Diagnosis Date   Arthritis    "hands and knees" (06/10/2015)   Basal cell carcinoma, arm    "both"   Basal cell carcinoma, face    Chest pain    Diabetes mellitus type II 10/23/2007   Intention tremor    Migraine    "none in years" (06/10/2015)   Osteoarthritis    Paresthesia of foot    bilateral   Stroke (HCC) 02/22/2003   mini stroke    Social History   Socioeconomic History   Marital status: Widowed    Spouse name: Not on file   Number of children: 2   Years of education: Not on file   Highest education level: Not on file  Occupational History   Occupation: Retired-Contractor  Tobacco Use   Smoking status: Never   Smokeless tobacco: Never   Tobacco comments:    "might have smoked 1 pack of cigarettes in my life"  Substance and Sexual Activity   Alcohol use: No   Drug use: No   Sexual activity: Not Currently  Other Topics Concern   Not on file  Social History Narrative   Retired Product/process development scientist    Social Determinants of Health   Financial Resource  Strain: Low Risk  (04/20/2022)   Overall Financial Resource Strain (CARDIA)    Difficulty of Paying Living Expenses: Not hard at all  Food Insecurity: No Food Insecurity (04/20/2022)   Hunger Vital Sign    Worried About Running Out of Food in the Last Year: Never true    Ran Out of Food in the Last Year: Never true  Transportation Needs: No Transportation Needs (04/20/2022)   PRAPARE - Transportation    Lack of Transportation (Medical): No    Lack of Transportation (Non-Medical): No  Physical Activity: Sufficiently Active (04/20/2022)   Exercise Vital Sign    Days of Exercise per Week: 5 days    Minutes of Exercise per Session: 40 min  Stress: No Stress Concern Present (04/20/2022)   [employer] of Occupational Health - Occupational Stress Questionnaire    Feeling of Stress : Not at all  Social Connections: Socially Isolated (04/20/2022)   Social Connection and Isolation Panel [NHANES]    Frequency of Communication with Friends and Family: More than three times a week    Frequency of Social Gatherings with Friends and Family: More than three times a week    Attends Religious Services: Never    Production manager of [employer]  or Organizations: No    Attends Banker Meetings: Never    Marital Status: Widowed  Intimate Partner Violence: Not At Risk (04/20/2022)   Humiliation, Afraid, Rape, and Kick questionnaire    Fear of Current or Ex-Partner: No    Emotionally Abused: No    Physically Abused: No    Sexually Abused: No    Past Surgical History:  Procedure Laterality Date   HAMMER TOE SURGERY Bilateral    JOINT REPLACEMENT     KNEE ARTHROPLASTY Left 06/10/2015   Procedure: COMPUTER ASSISTED TOTAL KNEE ARTHROPLASTY;  Surgeon: Eldred Manges, MD;  Location: [facility];  Service: Orthopedics;  Laterality: Left;   KNEE ARTHROSCOPY Bilateral 02/22/2000   LUMBAR LAMINECTOMY  X 2   TONSILLECTOMY      Family History  Problem Relation Age of Onset   Heart disease Father        died  age 85-MI   Diabetes type II Brother    Melanoma Brother     No Known Allergies  Current Outpatient Medications on File Prior to Visit  Medication Sig Dispense Refill   Cyanocobalamin (B-12) 100 MCG TABS B12     fluorometholone (FML) 0.1 % ophthalmic suspension Place into the left eye.     hydrocortisone (ANUSOL-HC) 25 MG suppository Place 1 suppository (25 mg total) rectally 2 (two) times daily. 12 suppository 0   metFORMIN (GLUCOPHAGE-XR) 500 MG 24 hr tablet TAKE ONE TABLET DAILY WITH BREAKFAST 180 tablet 1   Multiple Vitamins-Minerals (ZINC PO) Take by mouth.     neomycin-polymyxin b-dexamethasone (MAXITROL) 3.5-10000-0.1 SUSP Place 1 drop into the left eye 2 (two) times daily.     neomycin-polymyxin-dexameth (MAXITROL) 0.1 % OINT      neomycin-polymyxin-dexamethasone (MAXITROL) 0.1 % ophthalmic suspension neomycin-polymyxin-dexameth 3.5 mg/mL-10,000 unit/mL-0.1% eye drops     rosuvastatin (CRESTOR) 20 MG tablet Take 1 tablet (20 mg total) by mouth daily. 90 tablet 1   Zinc 100 MG TABS zinc     No current facility-administered medications on file prior to visit.    BP (!) 140/80   Pulse 79   Temp 98.4 F (36.9 C) (Oral)   Ht 5\' 11"  (1.803 m)   Wt 203 lb (92.1 kg)   SpO2 97%   BMI 28.31 kg/m       Objective:   Physical Exam Vitals and nursing note reviewed.  Constitutional:      Appearance: Normal appearance.  Cardiovascular:     Rate and Rhythm: Normal rate and regular rhythm.     Pulses: Normal pulses.     Heart sounds: Normal heart sounds.  Pulmonary:     Effort: Pulmonary effort is normal.     Breath sounds: Normal breath sounds.  Neurological:     General: No focal deficit present.     Mental Status: He is alert and oriented to person, place, and time.  Psychiatric:        Mood and Affect: Mood normal.        Behavior: Behavior normal.        Thought Content: Thought content normal.        Judgment: Judgment normal.        Assessment & Plan:  1.  Diabetes mellitus due to underlying condition with diabetic neuropathy, without long-term current use of insulin (HCC)  - POC HgB A1c- 6.1  - Follow up in 5 months for CPE  - metFORMIN (GLUCOPHAGE-XR) 500 MG 24 hr tablet; TAKE ONE TABLET DAILY WITH BREAKFAST  Dispense: 180 tablet; Refill: 1  Shirline Frees, NP

## 2022-09-22 DIAGNOSIS — M1711 Unilateral primary osteoarthritis, right knee: Secondary | ICD-10-CM | POA: Diagnosis not present

## 2022-11-10 DIAGNOSIS — M1711 Unilateral primary osteoarthritis, right knee: Secondary | ICD-10-CM | POA: Diagnosis not present

## 2022-12-01 DIAGNOSIS — H40013 Open angle with borderline findings, low risk, bilateral: Secondary | ICD-10-CM | POA: Diagnosis not present

## 2022-12-01 DIAGNOSIS — H25813 Combined forms of age-related cataract, bilateral: Secondary | ICD-10-CM | POA: Diagnosis not present

## 2022-12-01 DIAGNOSIS — E119 Type 2 diabetes mellitus without complications: Secondary | ICD-10-CM | POA: Diagnosis not present

## 2022-12-01 DIAGNOSIS — H04213 Epiphora due to excess lacrimation, bilateral lacrimal glands: Secondary | ICD-10-CM | POA: Diagnosis not present

## 2022-12-01 DIAGNOSIS — H524 Presbyopia: Secondary | ICD-10-CM | POA: Diagnosis not present

## 2022-12-09 ENCOUNTER — Telehealth: Payer: Self-pay

## 2022-12-09 NOTE — Telephone Encounter (Signed)
Called pt to schedule a surgical clearance.

## 2022-12-15 ENCOUNTER — Ambulatory Visit (INDEPENDENT_AMBULATORY_CARE_PROVIDER_SITE_OTHER): Payer: Medicare Other

## 2022-12-15 DIAGNOSIS — Z23 Encounter for immunization: Secondary | ICD-10-CM | POA: Diagnosis not present

## 2022-12-15 DIAGNOSIS — L280 Lichen simplex chronicus: Secondary | ICD-10-CM | POA: Diagnosis not present

## 2022-12-15 DIAGNOSIS — Z85828 Personal history of other malignant neoplasm of skin: Secondary | ICD-10-CM | POA: Diagnosis not present

## 2022-12-15 DIAGNOSIS — D0439 Carcinoma in situ of skin of other parts of face: Secondary | ICD-10-CM | POA: Diagnosis not present

## 2022-12-15 DIAGNOSIS — L570 Actinic keratosis: Secondary | ICD-10-CM | POA: Diagnosis not present

## 2022-12-15 DIAGNOSIS — D485 Neoplasm of uncertain behavior of skin: Secondary | ICD-10-CM | POA: Diagnosis not present

## 2022-12-15 DIAGNOSIS — L821 Other seborrheic keratosis: Secondary | ICD-10-CM | POA: Diagnosis not present

## 2022-12-16 ENCOUNTER — Encounter: Payer: Self-pay | Admitting: Adult Health

## 2022-12-16 ENCOUNTER — Ambulatory Visit (INDEPENDENT_AMBULATORY_CARE_PROVIDER_SITE_OTHER): Payer: Medicare Other | Admitting: Adult Health

## 2022-12-16 VITALS — BP 122/70 | HR 58 | Temp 98.8°F | Ht 71.0 in | Wt 203.0 lb

## 2022-12-16 DIAGNOSIS — Z01818 Encounter for other preprocedural examination: Secondary | ICD-10-CM | POA: Diagnosis not present

## 2022-12-16 DIAGNOSIS — Z0279 Encounter for issue of other medical certificate: Secondary | ICD-10-CM

## 2022-12-16 NOTE — Progress Notes (Signed)
Tyler Greer Date of Birth:  1935-08-09  This patient presents to the office today for a preoperativeconsultation at the request of surgeon, Dr. Trudee Grip, who plans on performing right total knee arthroplasty on January 2025.   Planned anesthesia:  Choice   Known anesthesia problems: No known issues with anesthesia.  Bleeding risk: none Personal or FH of DVT/PE: No    Patient Active Problem List   Diagnosis Date Noted   Arthritis 02/11/2021   Pain in joint of left knee 09/12/2020   Acute pain of right wrist 03/05/2019   Pain in joint of right elbow 02/26/2019   Pain in joint of right shoulder 01/16/2019   DDD (degenerative disc disease), cervical 08/30/2018   Pseudogout 07/22/2016   Diabetes mellitus with neuropathy (HCC) 06/30/2015   Status post total left knee replacement 06/10/2015   Actinic keratosis 11/06/2013   Diabetic neuropathy (HCC) 08/28/2013   Testosterone deficiency 07/27/2010   Dyslipidemia 07/27/2010   ACTION TREMOR 12/07/2006   Osteoarthritis 12/07/2006   Past Surgical History:  Procedure Laterality Date   HAMMER TOE SURGERY Bilateral    JOINT REPLACEMENT     KNEE ARTHROPLASTY Left 06/10/2015   Procedure: COMPUTER ASSISTED TOTAL KNEE ARTHROPLASTY;  Surgeon: Eldred Manges, MD;  Location: [facility];  Service: Orthopedics;  Laterality: Left;   KNEE ARTHROSCOPY Bilateral 02/22/2000   LUMBAR LAMINECTOMY  X 2   TONSILLECTOMY      No Known Allergies Current Meds  Medication Sig   clobetasol cream (TEMOVATE) 0.05 % SMARTSIG:Sparingly Topical Twice Daily   Cyanocobalamin (B-12) 100 MCG TABS B12   fluorometholone (FML) 0.1 % ophthalmic suspension Place into the left eye.   hydrocortisone (ANUSOL-HC) 25 MG suppository Place 1 suppository (25 mg total) rectally 2 (two) times daily.   metFORMIN (GLUCOPHAGE-XR) 500 MG 24 hr tablet TAKE ONE TABLET DAILY WITH BREAKFAST   Multiple Vitamins-Minerals (ZINC PO) Take by mouth.   neomycin-polymyxin b-dexamethasone  (MAXITROL) 3.5-10000-0.1 SUSP Place 1 drop into the left eye 2 (two) times daily.   neomycin-polymyxin-dexameth (MAXITROL) 0.1 % OINT    neomycin-polymyxin-dexamethasone (MAXITROL) 0.1 % ophthalmic suspension neomycin-polymyxin-dexameth 3.5 mg/mL-10,000 unit/mL-0.1% eye drops   rosuvastatin (CRESTOR) 20 MG tablet Take 1 tablet (20 mg total) by mouth daily.   Zinc 100 MG TABS zinc    Social History   Tobacco Use   Smoking status: Never   Smokeless tobacco: Never   Tobacco comments:    "might have smoked 1 pack of cigarettes in my life"  Substance Use Topics   Alcohol use: No   Family History  Problem Relation Age of Onset   Heart disease Father        died age 23-MI   Diabetes type II Brother    Melanoma Brother     Review of Systems Asymptomatic   Recent Labs: CBC:  Lab Results  Component Value Date   WBC 6.8 02/08/2022   HGB 14.4 02/08/2022   HGB 14.8 07/12/2021   HCT 44.1 02/08/2022   HCT 43.4 07/12/2021   MCH 30.2 07/12/2021   MCH 28.6 09/09/2016   MCHC 32.6 02/08/2022   RDW 14.7 02/08/2022   RDW 13.5 07/12/2021   PLT 223.0 02/08/2022   PLT 215 07/12/2021   CMP:  Lab Results  Component Value Date   NA 139 07/12/2021   K 4.5 07/12/2021   CL 102 07/12/2021   CO2 21 07/12/2021   ANIONGAP 7 09/09/2016   GLUCOSE 101 (H) 07/12/2021   GLUCOSE 103 (H) 02/05/2020  BUN 20 07/12/2021   CREATININE 1.34 (H) 07/12/2021   CREATININE 1.12 (H) 02/05/2020   GFRAA 71 02/05/2020   CALCIUM 9.3 07/12/2021   PROT 6.6 02/05/2020   BILITOT 0.6 02/05/2020   ALKPHOS 63 08/08/2019   ALT 13 02/05/2020   AST 18 02/05/2020    HBA1C:  Lab Results  Component Value Date   EAG 134 07/12/2021    Objective:   BP 122/70   Pulse (!) 58   Temp 98.8 F (37.1 C) (Oral)   Ht 5\' 11"  (1.803 m)   Wt 203 lb (92.1 kg)   SpO2 97%   BMI 28.31 kg/m  Weight: 203 lb (92.1 kg)  Physical Exam Vitals and nursing note reviewed.  Constitutional:      Appearance: Normal appearance.   Cardiovascular:     Rate and Rhythm: Normal rate and regular rhythm.     Pulses: Normal pulses.     Heart sounds: Normal heart sounds.  Pulmonary:     Effort: Pulmonary effort is normal.     Breath sounds: Normal breath sounds.  Abdominal:     General: Abdomen is flat. Bowel sounds are normal.     Palpations: Abdomen is soft.  Musculoskeletal:        General: Swelling (right knee) and tenderness (right knee) present. Normal range of motion.  Skin:    General: Skin is warm and dry.  Neurological:     General: No focal deficit present.     Mental Status: He is alert and oriented to person, place, and time.  Psychiatric:        Mood and Affect: Mood normal.        Behavior: Behavior normal.        Thought Content: Thought content normal.        Judgment: Judgment normal.      EKG Interpretation:  unchanged from previous tracings, RBBB, left anterior fascicular block rate 59, unchanged from previous tracings.     Assessment:   Malek was seen today for medical clearance.  Diagnoses and all orders for this visit:  Preoperative clearance -     EKG 12-Lead   87 y.o.patient  approved for Surgery     Plan:   1. Preoperative workup as follows:ECG 2. Change in medication regimen before surgery: none   Note electronically signed by provider.  Shirline Frees, NP

## 2023-02-07 DIAGNOSIS — M25561 Pain in right knee: Secondary | ICD-10-CM | POA: Diagnosis not present

## 2023-02-07 DIAGNOSIS — M1711 Unilateral primary osteoarthritis, right knee: Secondary | ICD-10-CM | POA: Diagnosis not present

## 2023-02-08 NOTE — H&P (Signed)
TOTAL KNEE ADMISSION H&P  Patient is being admitted for right total knee arthroplasty.  Subjective:  Chief Complaint: Right knee pain.  HPI: Tyler Greer, 87 y.o. male has a history of pain and functional disability in the right knee due to arthritis and has failed non-surgical conservative treatments for greater than 12 weeks to include NSAID's and/or analgesics, corticosteriod injections, and activity modification. Onset of symptoms was gradual, starting 8 years ago with gradually worsening course since that time. The patient noted no past surgery on the right knee.  Patient currently rates pain in the right knee at 7 out of 10 with activity. Patient has night pain, pain that interferes with activities of daily living, pain with passive range of motion, and crepitus. Patient has evidence of  bone-on-bone arthritis in the lateral and patellofemoral compartments with chondrocalcinosis  by imaging studies. There is no active infection.  Patient Active Problem List   Diagnosis Date Noted   Arthritis 02/11/2021   Pain in joint of left knee 09/12/2020   Acute pain of right wrist 03/05/2019   Pain in joint of right elbow 02/26/2019   Pain in joint of right shoulder 01/16/2019   DDD (degenerative disc disease), cervical 08/30/2018   Pseudogout 07/22/2016   Diabetes mellitus with neuropathy (HCC) 06/30/2015   Status post total left knee replacement 06/10/2015   Actinic keratosis 11/06/2013   Diabetic neuropathy (HCC) 08/28/2013   Testosterone deficiency 07/27/2010   Dyslipidemia 07/27/2010   ACTION TREMOR 12/07/2006   Osteoarthritis 12/07/2006    Past Medical History:  Diagnosis Date   Arthritis    "hands and knees" (06/10/2015)   Basal cell carcinoma, arm    "both"   Basal cell carcinoma, face    Chest pain    Diabetes mellitus type II 10/23/2007   Intention tremor    Migraine    "none in years" (06/10/2015)   Osteoarthritis    Paresthesia of foot    bilateral   Stroke (HCC)  02/22/2003   mini stroke    Past Surgical History:  Procedure Laterality Date   HAMMER TOE SURGERY Bilateral    JOINT REPLACEMENT     KNEE ARTHROPLASTY Left 06/10/2015   Procedure: COMPUTER ASSISTED TOTAL KNEE ARTHROPLASTY;  Surgeon: Eldred Manges, MD;  Location: [facility] OR;  Service: Orthopedics;  Laterality: Left;   KNEE ARTHROSCOPY Bilateral 02/22/2000   LUMBAR LAMINECTOMY  X 2   TONSILLECTOMY      Prior to Admission medications   Medication Sig Start Date End Date Taking? Authorizing Provider  clobetasol cream (TEMOVATE) 0.05 % SMARTSIG:Sparingly Topical Twice Daily 12/15/22   [provider]  Cyanocobalamin (B-12) 100 MCG TABS B12    [provider]  fluorometholone (FML) 0.1 % ophthalmic suspension Place into the left eye. 03/25/21   [provider]  hydrocortisone (ANUSOL-HC) 25 MG suppository Place 1 suppository (25 mg total) rectally 2 (two) times daily. 05/21/21   Nafziger, Kandee Keen, NP  metFORMIN (GLUCOPHAGE-XR) 500 MG 24 hr tablet TAKE ONE TABLET DAILY WITH BREAKFAST 09/20/22   Nafziger, Kandee Keen, NP  Multiple Vitamins-Minerals (ZINC PO) Take by mouth.    [provider]  neomycin-polymyxin b-dexamethasone (MAXITROL) 3.5-10000-0.1 SUSP Place 1 drop into the left eye 2 (two) times daily. 03/10/21   [provider]  neomycin-polymyxin-dexameth (MAXITROL) 0.1 % OINT     [provider]  neomycin-polymyxin-dexamethasone (MAXITROL) 0.1 % ophthalmic suspension neomycin-polymyxin-dexameth 3.5 mg/mL-10,000 unit/mL-0.1% eye drops    [provider]  rosuvastatin (CRESTOR) 20 MG tablet Take  1 tablet (20 mg total) by mouth daily. 04/01/20   Shirline Frees, NP  Zinc 100 MG TABS zinc    [provider]    No Known Allergies  Social History   Socioeconomic History   Marital status: Widowed    Spouse name: Not on file   Number of children: 2   Years of education: Not on file   Highest education level: Not on file  Occupational  History   Occupation: Retired-Contractor  Tobacco Use   Smoking status: Never   Smokeless tobacco: Never   Tobacco comments:    "might have smoked 1 pack of cigarettes in my life"  Substance and Sexual Activity   Alcohol use: No   Drug use: No   Sexual activity: Not Currently  Other Topics Concern   Not on file  Social History Narrative   Retired Product/process development scientist    Social Drivers of Health   Financial Resource Strain: Low Risk  (04/20/2022)   Overall Financial Resource Strain (CARDIA)    Difficulty of Paying Living Expenses: Not hard at all  Food Insecurity: No Food Insecurity (04/20/2022)   Hunger Vital Sign    Worried About Running Out of Food in the Last Year: Never true    Ran Out of Food in the Last Year: Never true  Transportation Needs: No Transportation Needs (04/20/2022)   PRAPARE - Transportation    Lack of Transportation (Medical): No    Lack of Transportation (Non-Medical): No  Physical Activity: Sufficiently Active (04/20/2022)   Exercise Vital Sign    Days of Exercise per Week: 5 days    Minutes of Exercise per Session: 40 min  Stress: No Stress Concern Present (04/20/2022)   [employer] of Occupational Health - Occupational Stress Questionnaire    Feeling of Stress : Not at all  Social Connections: Socially Isolated (04/20/2022)   Social Connection and Isolation Panel [NHANES]    Frequency of Communication with Friends and Family: More than three times a week    Frequency of Social Gatherings with Friends and Family: More than three times a week    Attends Religious Services: Never    Database administrator or Organizations: No    Attends Banker Meetings: Never    Marital Status: Widowed  Intimate Partner Violence: Not At Risk (04/20/2022)   Humiliation, Afraid, Rape, and Kick questionnaire    Fear of Current or Ex-Partner: No    Emotionally Abused: No    Physically Abused: No    Sexually Abused: No    Tobacco Use: Low Risk   (12/16/2022)   Patient History    Smoking Tobacco Use: Never    Smokeless Tobacco Use: Never    Passive Exposure: Not on file   Social History   Substance and Sexual Activity  Alcohol Use No    Family History  Problem Relation Age of Onset   Heart disease Father        died age 67-MI   Diabetes type II Brother    Melanoma Brother     ROS  Objective:  Physical Exam: Well nourished and well developed.  General: Alert and oriented x3, cooperative and pleasant, no acute distress.  Head: normocephalic, atraumatic, neck supple.  Eyes: EOMI.  Musculoskeletal:  Right knee Moderate to large effusion, range of motion 5-110 degrees with crepitus, tender medially, no lateral tenderness or instability.  Calves soft and nontender. Motor function intact in LE. Strength 5/5 LE bilaterally. Neuro: Distal pulses 2+. Sensation  to light touch intact in LE.  Imaging Review Plain radiographs demonstrate severe degenerative joint disease of the right knee. The overall alignment is neutral. The bone quality appears to be adequate for age and reported activity level.  Assessment/Plan:  End stage arthritis, right knee   The patient history, physical examination, clinical judgment of the provider and imaging studies are consistent with end stage degenerative joint disease of the right knee and total knee arthroplasty is deemed medically necessary. The treatment options including medical management, injection therapy arthroscopy and arthroplasty were discussed at length. The risks and benefits of total knee arthroplasty were presented and reviewed. The risks due to aseptic loosening, infection, stiffness, patella tracking problems, thromboembolic complications and other imponderables were discussed. The patient acknowledged the explanation, agreed to proceed with the plan and consent was signed. Patient is being admitted for inpatient treatment for surgery, pain control, PT, OT, prophylactic  antibiotics, VTE prophylaxis, progressive ambulation and ADLs and discharge planning. The patient is planning to be discharged  home .   Patient's anticipated LOS is less than 2 midnights, meeting these requirements: - Lives within 1 hour of care - Has a competent adult at home to recover with post-op recover - NO history of  - Chronic pain requiring opioids  - Coronary Artery Disease  - Heart failure  - Heart attack  - DVT/VTE  - Cardiac arrhythmia  - Respiratory Failure/COPD  - Renal failure  - Anemia  - Advanced Liver disease  Therapy Plans: Outpatient therapy at [facility] Disposition: Home with neighbor Planned DVT Prophylaxis: Xarelto 10 mg QD DME Needed: None PCP: Shirline Frees, FNP (clearance received) TXA: IV Allergies: NKDA Metal Allergy: None Anesthesia Concerns: None BMI: 28.6 Last HgbA1c: 6.1% (08/2022) - rechecking at PAT Pain Regimen: Oxycodone, tramadol Pharmacy: [facility]  Other: - Hx of small CVA (20 years ago)   - Patient was instructed on what medications to stop prior to surgery. - Follow-up visit in 2 weeks with Dr. Lequita Halt - Begin physical therapy following surgery - Pre-operative lab work as pre-surgical testing - Prescriptions will be provided in hospital at time of discharge  Arther Abbott, PA-C Orthopedic Surgery [facility] [place]

## 2023-02-17 NOTE — Patient Instructions (Signed)
SURGICAL WAITING ROOM VISITATION  Patients having surgery or a procedure may have no more than 2 support people in the waiting area - these visitors may rotate.    Children under the age of 31 must have an adult with them who is not the patient.    If the patient needs to stay at the hospital during part of their recovery, the visitor guidelines for inpatient rooms apply. Pre-op nurse will coordinate an appropriate time for 1 support person to accompany patient in pre-op.  This support person may not rotate.    Please refer to the [facility] website for the visitor guidelines for Inpatients (after your surgery is over and you are in a regular room).       Your procedure is scheduled on: 03-06-23   Report to [facility] Main Entrance    Report to admitting at      0515  AM   Call this number if you have problems the morning of surgery [phone]   Do not eat food :After Midnight.   After Midnight you may have the following liquids until 0415______ AM  DAY OF SURGERY   then nothing by mouth  Water Non-Citrus Juices (without pulp, NO RED-Apple, White grape, White cranberry) Black Coffee (NO MILK/CREAM OR CREAMERS, sugar ok)  Clear Tea (NO MILK/CREAM OR CREAMERS, sugar ok) regular and decaf                             Plain Jell-O (NO RED)                                           Fruit ices (not with fruit pulp, NO RED)                                     Popsicles (NO RED)                                                               Sports drinks like Gatorade (NO RED)                   The day of surgery:  Drink ONE (1) Pre-Surgery  G2 by 0415 AM the morning of surgery. Drink in one sitting. Do not sip.  This drink was given to you during your hospital  pre-op appointment visit. Nothing else to drink after completing the  Pre-Surgery  G2 by 0415 am .          If you have questions, please contact your surgeon's office.   FOLLOW ANY ADDITIONAL PRE OP  INSTRUCTIONS YOU RECEIVED FROM YOUR SURGEON'S OFFICE!!!     Oral Hygiene is also important to reduce your risk of infection.                                    Remember - BRUSH YOUR TEETH THE MORNING OF SURGERY WITH YOUR REGULAR TOOTHPASTE  DENTURES WILL BE REMOVED PRIOR TO SURGERY  PLEASE DO NOT APPLY "Poly grip" OR ADHESIVES!!!   Do NOT smoke after Midnight   Stop all vitamins and herbal supplements 7 days before surgery.   Take these medicines the morning of surgery with A SIP OF WATER: tylenol if needed  DO NOT TAKE ANY ORAL DIABETIC MEDICATIONS DAY OF YOUR SURGERY                                You may not have any metal on your body including hair pins, jewelry, and body piercing             Do not wear lotions, powders, perfumes/cologne, or deodorant                Men may shave face and neck.   Do not bring valuables to the hospital. [facility] IS NOT             RESPONSIBLE   FOR VALUABLES.   Contacts, glasses, dentures or bridgework may not be worn into surgery.   Bring small overnight bag day of surgery.   DO NOT BRING YOUR HOME MEDICATIONS TO THE HOSPITAL. PHARMACY WILL DISPENSE MEDICATIONS LISTED ON YOUR MEDICATION LIST TO YOU DURING YOUR ADMISSION IN THE HOSPITAL!    Patients discharged on the day of surgery will not be allowed to drive home.  Someone NEEDS to stay with you for the first 24 hours after anesthesia.   Special Instructions: Bring a copy of your healthcare power of attorney and living will documents the day of surgery if you haven't scanned them before.              Please read over the following fact sheets you were given: IF YOU HAVE QUESTIONS ABOUT YOUR PRE-OP INSTRUCTIONS PLEASE CALL [phone]   . If you test positive for Covid or have been in contact with anyone that has tested positive in the last 10 days please notify you surgeon.      Pre-operative 5 CHG Bath Instructions   You can play a key role in reducing the risk of infection  after surgery. Your skin needs to be as free of germs as possible. You can reduce the number of germs on your skin by washing with CHG (chlorhexidine gluconate) soap before surgery. CHG is an antiseptic soap that kills germs and continues to kill germs even after washing.   DO NOT use if you have an allergy to chlorhexidine/CHG or antibacterial soaps. If your skin becomes reddened or irritated, stop using the CHG and notify one of our RNs at [phone].   Please shower with the CHG soap starting 4 days before surgery using the following schedule:     Please keep in mind the following:  DO NOT shave, including legs and underarms, starting the day of your first shower.   You may shave your face at any point before/day of surgery.  Place clean sheets on your bed the day you start using CHG soap. Use a clean washcloth (not used since being washed) for each shower. DO NOT sleep with pets once you start using the CHG.   CHG Shower Instructions:  If you choose to wash your hair and private area, wash first with your normal shampoo/soap.  After you use shampoo/soap, rinse your hair and body thoroughly to remove shampoo/soap residue.  Turn the water OFF and apply about 3 tablespoons (45 ml) of CHG soap to a CLEAN washcloth.  Apply CHG soap ONLY FROM YOUR NECK DOWN TO YOUR TOES (washing for 3-5 minutes)  DO NOT use CHG soap on face, private areas, open wounds, or sores.  Pay special attention to the area where your surgery is being performed.  If you are having back surgery, having someone wash your back for you may be helpful. Wait 2 minutes after CHG soap is applied, then you may rinse off the CHG soap.  Pat dry with a clean towel  Put on clean clothes/pajamas   If you choose to wear lotion, please use ONLY the CHG-compatible lotions on the back of this paper.     Additional instructions for the day of surgery: DO NOT APPLY any lotions, deodorants, cologne, or perfumes.   Put on  clean/comfortable clothes.  Brush your teeth.  Ask your nurse before applying any prescription medications to the skin.      CHG Compatible Lotions   Aveeno Moisturizing lotion  Cetaphil Moisturizing Cream  Cetaphil Moisturizing Lotion  Clairol Herbal Essence Moisturizing Lotion, Dry Skin  Clairol Herbal Essence Moisturizing Lotion, Extra Dry Skin  Clairol Herbal Essence Moisturizing Lotion, Normal Skin  Curel Age Defying Therapeutic Moisturizing Lotion with Alpha Hydroxy  Curel Extreme Care Body Lotion  Curel Soothing Hands Moisturizing Hand Lotion  Curel Therapeutic Moisturizing Cream, Fragrance-Free  Curel Therapeutic Moisturizing Lotion, Fragrance-Free  Curel Therapeutic Moisturizing Lotion, Original Formula  Eucerin Daily Replenishing Lotion  Eucerin Dry Skin Therapy Plus Alpha Hydroxy Crme  Eucerin Dry Skin Therapy Plus Alpha Hydroxy Lotion  Eucerin Original Crme  Eucerin Original Lotion  Eucerin Plus Crme Eucerin Plus Lotion  Eucerin TriLipid Replenishing Lotion  Keri Anti-Bacterial Hand Lotion  Keri Deep Conditioning Original Lotion Dry Skin Formula Softly Scented  Keri Deep Conditioning Original Lotion, Fragrance Free Sensitive Skin Formula  Keri Lotion Fast Absorbing Fragrance Free Sensitive Skin Formula  Keri Lotion Fast Absorbing Softly Scented Dry Skin Formula  Keri Original Lotion  Keri Skin Renewal Lotion Keri Silky Smooth Lotion  Keri Silky Smooth Sensitive Skin Lotion  Nivea Body Creamy Conditioning Oil  Nivea Body Extra Enriched Lotion  Nivea Body Original Lotion  Nivea Body Sheer Moisturizing Lotion Nivea Crme  Nivea Skin Firming Lotion  NutraDerm 30 Skin Lotion  NutraDerm Skin Lotion  NutraDerm Therapeutic Skin Cream  NutraDerm Therapeutic Skin Lotion  ProShield Protective Hand Cream   Incentive Spirometer  An incentive spirometer is a tool that can help keep your lungs clear and active. This tool measures how well you are filling your lungs  with each breath. Taking long deep breaths may help reverse or decrease the chance of developing breathing (pulmonary) problems (especially infection) following: A long period of time when you are unable to move or be active. BEFORE THE PROCEDURE  If the spirometer includes an indicator to show your best effort, your nurse or respiratory therapist will set it to a desired goal. If possible, sit up straight or lean slightly forward. Try not to slouch. Hold the incentive spirometer in an upright position. INSTRUCTIONS FOR USE  Sit on the edge of your bed if possible, or sit up as far as you can in bed or on a chair. Hold the incentive spirometer in an upright position. Breathe out normally. Place the mouthpiece in your mouth and seal your lips tightly around it. Breathe in slowly and as deeply as possible, raising the piston or the ball toward the top of the column. Hold your breath for 3-5 seconds or for as long as possible.  Allow the piston or ball to fall to the bottom of the column. Remove the mouthpiece from your mouth and breathe out normally. Rest for a few seconds and repeat Steps 1 through 7 at least 10 times every 1-2 hours when you are awake. Take your time and take a few normal breaths between deep breaths. The spirometer may include an indicator to show your best effort. Use the indicator as a goal to work toward during each repetition. After each set of 10 deep breaths, practice coughing to be sure your lungs are clear. If you have an incision (the cut made at the time of surgery), support your incision when coughing by placing a pillow or rolled up towels firmly against it. Once you are able to get out of bed, walk around indoors and cough well. You may stop using the incentive spirometer when instructed by your caregiver.  RISKS AND COMPLICATIONS Take your time so you do not get dizzy or light-headed. If you are in pain, you may need to take or ask for pain medication before doing  incentive spirometry. It is harder to take a deep breath if you are having pain. AFTER USE Rest and breathe slowly and easily. It can be helpful to keep track of a log of your progress. Your caregiver can provide you with a simple table to help with this. If you are using the spirometer at home, follow these instructions: SEEK MEDICAL CARE IF:  You are having difficultly using the spirometer. You have trouble using the spirometer as often as instructed. Your pain medication is not giving enough relief while using the spirometer. You develop fever of 100.5 F (38.1 C) or higher. SEEK IMMEDIATE MEDICAL CARE IF:  You cough up bloody sputum that had not been present before. You develop fever of 102 F (38.9 C) or greater. You develop worsening pain at or near the incision site. MAKE SURE YOU:  Understand these instructions. Will watch your condition. Will get help right away if you are not doing well or get worse. Document Released: 06/20/2006 Document Revised: 05/02/2011 Document Reviewed: 08/21/2006 [facility] Patient Information 2014 [place], [place].   ________________________________________________________________________

## 2023-02-17 NOTE — Progress Notes (Addendum)
PCP - Shirline Frees, NP  clearance 01-04-23 in notes epic with EKG and LOV note Cardiologist - Lisette Grinder, MD 02-02-21 eval for CP LOV note epic  PPM/ICD -  Device Orders -  Rep Notified -   Chest x-ray - 2022 EKG - 12-16-22 on chart Stress Test - 02-25-21 epic ECHO - 02-25-21 epic Cardiac Cath -   Sleep Study -  CPAP -   Fasting Blood Sugar -  Checks Blood Sugar _____ times a day  Blood Thinner Instructions: Aspirin Instructions:  ERAS Protcol - PRE-SURGERY G2-    COVID vaccine -  Activity-- Anesthesia review: Tia 2005,  DM2, Action tremor  Patient denies shortness of breath, fever, cough and chest pain at PAT appointment   All instructions explained to the patient, with a verbal understanding of the material. Patient agrees to go over the instructions while at home for a better understanding. Patient also instructed to self quarantine after being tested for COVID-19. The opportunity to ask questions was provided.

## 2023-02-23 ENCOUNTER — Encounter (HOSPITAL_COMMUNITY)
Admission: RE | Admit: 2023-02-23 | Discharge: 2023-02-23 | Disposition: A | Discharge status: Home / Self Care | Payer: Medicare Other | Source: Ambulatory Visit | Attending: Orthopedic Surgery | Admitting: Orthopedic Surgery

## 2023-02-23 ENCOUNTER — Encounter (HOSPITAL_COMMUNITY): Payer: Self-pay

## 2023-02-23 ENCOUNTER — Other Ambulatory Visit: Payer: Self-pay

## 2023-02-23 VITALS — BP 153/88 | HR 83 | Temp 98.2°F | Resp 18 | Ht 71.0 in | Wt 208.0 lb

## 2023-02-23 DIAGNOSIS — E0840 Diabetes mellitus due to underlying condition with diabetic neuropathy, unspecified: Secondary | ICD-10-CM

## 2023-02-23 DIAGNOSIS — Z01818 Encounter for other preprocedural examination: Secondary | ICD-10-CM

## 2023-02-23 DIAGNOSIS — Z01812 Encounter for preprocedural laboratory examination: Secondary | ICD-10-CM | POA: Insufficient documentation

## 2023-02-23 DIAGNOSIS — E1140 Type 2 diabetes mellitus with diabetic neuropathy, unspecified: Secondary | ICD-10-CM

## 2023-02-23 LAB — BASIC METABOLIC PANEL
Anion gap: 5 (ref 5–15)
BUN: 36 mg/dL — ABNORMAL HIGH (ref 8–23)
CO2: 23 mmol/L (ref 22–32)
Calcium: 9 mg/dL (ref 8.9–10.3)
Chloride: 108 mmol/L (ref 98–111)
Creatinine, Ser: 1.37 mg/dL — ABNORMAL HIGH (ref 0.61–1.24)
GFR, Estimated: 51 mL/min — ABNORMAL LOW (ref >60)
Glucose, Bld: 103 mg/dL — ABNORMAL HIGH (ref 70–99)
Potassium: 4.4 mmol/L (ref 3.5–5.1)
Sodium: 136 mmol/L (ref 135–145)

## 2023-02-23 LAB — GLUCOSE, CAPILLARY: Glucose-Capillary: 90 mg/dL (ref 70–99)

## 2023-02-23 LAB — CBC
HCT: 40.7 % (ref 39.0–52.0)
Hemoglobin: 13.5 g/dL (ref 13.0–17.0)
MCH: 30.7 pg (ref 26.0–34.0)
MCHC: 33.2 g/dL (ref 30.0–36.0)
MCV: 92.5 fL (ref 80.0–100.0)
Platelets: 196 10*3/uL (ref 150–400)
RBC: 4.4 MIL/uL (ref 4.22–5.81)
RDW: 14 % (ref 11.5–15.5)
WBC: 10 10*3/uL (ref 4.0–10.5)
nRBC: 0 % (ref 0.0–0.2)

## 2023-02-23 LAB — SURGICAL PCR SCREEN
MRSA, PCR: NEGATIVE
Staphylococcus aureus: NEGATIVE

## 2023-02-23 NOTE — Progress Notes (Addendum)
 PCP - Darleene Shape, NP  clearance 01-04-23 in notes epic with EKG and LOV note Cardiologist - Lonni Bells, MD 02-02-21 eval for CP LOV note epic  PPM/ICD - none Device Orders -  Rep Notified -   Chest x-ray - 2022 EKG - 12-16-22 on chart Stress Test - 02-25-21 epic ECHO - 02-25-21 epic Cardiac Cath -   Sleep Study - none CPAP -   Fasting Blood Sugar - 90 Checks Blood Sugar __0___ times a day  Blood Thinner Instructions:none Aspirin  Instructions:none  ERAS Protcol - PRE-SURGERY G2- yes   COVID vaccine -yes. Has not had COVID in last 3 months  Activity--Can go up a flight of stairs. No SOB. No chest pain Anesthesia review: Tia 2005,  DM2, Action tremor  Patient denies shortness of breath, fever, cough and chest pain at PAT appointment   All instructions explained to the patient, with a verbal understanding of the material. Patient agrees to go over the instructions while at home for a better understanding. Patient also instructed to self quarantine after being tested for COVID-19. The opportunity to ask questions was provided.

## 2023-02-24 LAB — HEMOGLOBIN A1C
Hgb A1c MFr Bld: 6.7 % — ABNORMAL HIGH (ref 4.8–5.6)
Mean Plasma Glucose: 146 mg/dL

## 2023-03-05 NOTE — Anesthesia Preprocedure Evaluation (Addendum)
 Anesthesia Evaluation  Patient identified by MRN, date of birth, ID band Patient awake    Reviewed: Allergy & Precautions, NPO status , Patient's Chart, lab work & pertinent test results  History of Anesthesia Complications Negative for: history of anesthetic complications  Airway Mallampati: III  TM Distance: >3 FB Neck ROM: Full    Dental  (+) Dental Advisory Given,    Pulmonary neg pulmonary ROS   Pulmonary exam normal breath sounds clear to auscultation       Cardiovascular (-) hypertension(-) angina (-) Past MI, (-) Cardiac Stents and (-) CABG (-) dysrhythmias (RBBB)  Rhythm:Regular Rate:Normal  Normal stress test 02/25/2021  TTE 02/25/2021: IMPRESSIONS     1. Left ventricular ejection fraction, by estimation, is 55 to 60%. The  left ventricle has normal function. The left ventricle has no regional  wall motion abnormalities. There is moderate asymmetric left ventricular  hypertrophy of the basal-septal  segment. Left ventricular diastolic parameters are consistent with Grade I  diastolic dysfunction (impaired relaxation).   2. Right ventricular systolic function is normal. The right ventricular  size is normal.   3. The mitral valve is abnormal. Trivial mitral valve regurgitation.   4. The aortic valve is tricuspid. Aortic valve regurgitation is not  visualized.   5. Aortic dilatation noted. There is borderline dilatation of the  ascending aorta, measuring 38 mm.     Neuro/Psych  Headaches, neg Seizures Intention tremor  Neuromuscular disease (bilateral foot paresthesia) CVA (02/2003), No Residual Symptoms    GI/Hepatic negative GI ROS, Neg liver ROS,,,  Endo/Other  diabetes (Hgb A1c 6.7), Well Controlled, Type 2, Oral Hypoglycemic Agents    Renal/GU negative Renal ROS     Musculoskeletal  (+) Arthritis , Osteoarthritis,    Abdominal   Peds  Hematology negative hematology ROS (+) Lab Results       Component                Value               Date                      WBC                      10.0                02/23/2023                HGB                      13.5                02/23/2023                HCT                      40.7                02/23/2023                MCV                      92.5                02/23/2023                PLT  196                 02/23/2023              Anesthesia Other Findings Previous back surgery x2  Reproductive/Obstetrics                             Anesthesia Physical Anesthesia Plan  ASA: 2  Anesthesia Plan: MAC and Spinal   Post-op Pain Management: Tylenol  PO (pre-op)* and Regional block*   Induction: Intravenous  PONV Risk Score and Plan: 1 and Ondansetron , Dexamethasone , Propofol  infusion and Treatment may vary due to age or medical condition  Airway Management Planned: Simple Face Mask and Natural Airway  Additional Equipment:   Intra-op Plan:   Post-operative Plan:   Informed Consent: I have reviewed the patients History and Physical, chart, labs and discussed the procedure including the risks, benefits and alternatives for the proposed anesthesia with the patient or authorized representative who has indicated his/her understanding and acceptance.     Dental advisory given  Plan Discussed with: CRNA and Anesthesiologist  Anesthesia Plan Comments: (Discussed potential risks of nerve blocks including, but not limited to, infection, bleeding, nerve damage, seizures, pneumothorax, respiratory depression, and potential failure of the block. Alternatives to nerve blocks discussed. All questions answered.  I have discussed risks of neuraxial anesthesia including but not limited to infection, bleeding, nerve injury, back pain, headache, seizures, and failure of block. Patient denies bleeding disorders and is not currently anticoagulated. Labs have been reviewed. Risks and  benefits discussed. All patient's questions answered.   Discussed with patient risks of MAC including, but not limited to, minor pain or discomfort, hearing people in the room, and possible need for backup general anesthesia. Risks for general anesthesia also discussed including, but not limited to, sore throat, hoarse voice, chipped/damaged teeth, injury to vocal cords, nausea and vomiting, allergic reactions, lung infection, heart attack, stroke, and death. All questions answered. )        Anesthesia Quick Evaluation

## 2023-03-06 ENCOUNTER — Ambulatory Visit (HOSPITAL_BASED_OUTPATIENT_CLINIC_OR_DEPARTMENT_OTHER): Payer: Medicare Other | Admitting: Anesthesiology

## 2023-03-06 ENCOUNTER — Ambulatory Visit (HOSPITAL_COMMUNITY): Payer: Self-pay | Admitting: Anesthesiology

## 2023-03-06 ENCOUNTER — Encounter (HOSPITAL_COMMUNITY): Payer: Self-pay | Admitting: Orthopedic Surgery

## 2023-03-06 ENCOUNTER — Other Ambulatory Visit: Payer: Self-pay

## 2023-03-06 ENCOUNTER — Ambulatory Visit (HOSPITAL_COMMUNITY)
Admission: RE | Admit: 2023-03-06 | Discharge: 2023-03-07 | Disposition: A | Discharge status: Home / Self Care | Payer: Medicare Other | Attending: Orthopedic Surgery | Admitting: Orthopedic Surgery

## 2023-03-06 ENCOUNTER — Encounter (HOSPITAL_COMMUNITY)
Admission: RE | Disposition: A | Discharge status: Home / Self Care | Payer: Self-pay | Source: Home / Self Care | Attending: Orthopedic Surgery

## 2023-03-06 DIAGNOSIS — M1711 Unilateral primary osteoarthritis, right knee: Secondary | ICD-10-CM | POA: Insufficient documentation

## 2023-03-06 DIAGNOSIS — Z96652 Presence of left artificial knee joint: Secondary | ICD-10-CM | POA: Insufficient documentation

## 2023-03-06 DIAGNOSIS — M1710 Unilateral primary osteoarthritis, unspecified knee: Secondary | ICD-10-CM

## 2023-03-06 DIAGNOSIS — E1140 Type 2 diabetes mellitus with diabetic neuropathy, unspecified: Secondary | ICD-10-CM | POA: Diagnosis not present

## 2023-03-06 DIAGNOSIS — E785 Hyperlipidemia, unspecified: Secondary | ICD-10-CM | POA: Diagnosis not present

## 2023-03-06 DIAGNOSIS — Z7984 Long term (current) use of oral hypoglycemic drugs: Secondary | ICD-10-CM | POA: Insufficient documentation

## 2023-03-06 DIAGNOSIS — G8918 Other acute postprocedural pain: Secondary | ICD-10-CM | POA: Diagnosis not present

## 2023-03-06 DIAGNOSIS — Z85828 Personal history of other malignant neoplasm of skin: Secondary | ICD-10-CM | POA: Diagnosis not present

## 2023-03-06 DIAGNOSIS — E0840 Diabetes mellitus due to underlying condition with diabetic neuropathy, unspecified: Secondary | ICD-10-CM

## 2023-03-06 DIAGNOSIS — M25521 Pain in right elbow: Secondary | ICD-10-CM

## 2023-03-06 DIAGNOSIS — M179 Osteoarthritis of knee, unspecified: Secondary | ICD-10-CM | POA: Diagnosis present

## 2023-03-06 HISTORY — PX: TOTAL KNEE ARTHROPLASTY: SHX125

## 2023-03-06 LAB — GLUCOSE, CAPILLARY
Glucose-Capillary: 119 mg/dL — ABNORMAL HIGH (ref 70–99)
Glucose-Capillary: 120 mg/dL — ABNORMAL HIGH (ref 70–99)
Glucose-Capillary: 158 mg/dL — ABNORMAL HIGH (ref 70–99)
Glucose-Capillary: 191 mg/dL — ABNORMAL HIGH (ref 70–99)
Glucose-Capillary: 229 mg/dL — ABNORMAL HIGH (ref 70–99)

## 2023-03-06 SURGERY — ARTHROPLASTY, KNEE, TOTAL
Anesthesia: Monitor Anesthesia Care | Site: Knee | Laterality: Right

## 2023-03-06 MED ORDER — FLEET ENEMA RE ENEM: 1.0000 | ENEMA | Freq: Once | RECTAL | Status: DC | PRN

## 2023-03-06 MED ORDER — METHOCARBAMOL 1000 MG/10ML IJ SOLN
INTRAMUSCULAR | Status: AC
  Filled 2023-03-06: qty 10

## 2023-03-06 MED ORDER — ONDANSETRON HCL 4 MG/2ML IJ SOLN
4.0000 mg | Freq: Four times a day (QID) | INTRAMUSCULAR | Status: DC | PRN
  Administered 2023-03-06: 4 mg via INTRAVENOUS
  Filled 2023-03-06: qty 2

## 2023-03-06 MED ORDER — SODIUM CHLORIDE (PF) 0.9 % IJ SOLN
INTRAMUSCULAR | Status: DC | PRN
  Administered 2023-03-06: 80 mL

## 2023-03-06 MED ORDER — ACETAMINOPHEN 325 MG PO TABS: 325.0000 mg | ORAL_TABLET | Freq: Four times a day (QID) | ORAL | Status: DC | PRN

## 2023-03-06 MED ORDER — ACETAMINOPHEN 500 MG PO TABS: 1000.0000 mg | ORAL_TABLET | Freq: Once | ORAL | Status: DC

## 2023-03-06 MED ORDER — AMISULPRIDE (ANTIEMETIC) 5 MG/2ML IV SOLN
INTRAVENOUS | Status: AC
  Administered 2023-03-06: 10 mg via INTRAVENOUS
  Filled 2023-03-06: qty 4

## 2023-03-06 MED ORDER — ACETAMINOPHEN 10 MG/ML IV SOLN
1000.0000 mg | Freq: Four times a day (QID) | INTRAVENOUS | Status: DC
  Administered 2023-03-06: 1000 mg via INTRAVENOUS
  Filled 2023-03-06: qty 100

## 2023-03-06 MED ORDER — RIVAROXABAN 10 MG PO TABS
10.0000 mg | ORAL_TABLET | Freq: Every day | ORAL | Status: DC
  Administered 2023-03-07: 10 mg via ORAL
  Filled 2023-03-06 (×2): qty 1

## 2023-03-06 MED ORDER — ROCURONIUM BROMIDE 100 MG/10ML IV SOLN
INTRAVENOUS | Status: DC | PRN
  Administered 2023-03-06: 25 mg via INTRAVENOUS

## 2023-03-06 MED ORDER — ACETAMINOPHEN 500 MG PO TABS
1000.0000 mg | ORAL_TABLET | Freq: Four times a day (QID) | ORAL | Status: AC
  Administered 2023-03-06 – 2023-03-07 (×3): 1000 mg via ORAL
  Filled 2023-03-06 (×3): qty 2

## 2023-03-06 MED ORDER — FENTANYL CITRATE PF 50 MCG/ML IJ SOSY
25.0000 ug | PREFILLED_SYRINGE | INTRAMUSCULAR | Status: DC | PRN
  Administered 2023-03-06: 50 ug via INTRAVENOUS

## 2023-03-06 MED ORDER — PROPOFOL 10 MG/ML IV BOLUS
INTRAVENOUS | Status: DC | PRN
  Administered 2023-03-06 (×2): 100 mg via INTRAVENOUS
  Administered 2023-03-06: 20 mg via INTRAVENOUS

## 2023-03-06 MED ORDER — DEXAMETHASONE SODIUM PHOSPHATE 10 MG/ML IJ SOLN
8.0000 mg | Freq: Once | INTRAMUSCULAR | Status: AC
  Administered 2023-03-06: 8 mg via INTRAVENOUS

## 2023-03-06 MED ORDER — MENTHOL 3 MG MT LOZG
1.0000 | LOZENGE | OROMUCOSAL | Status: DC | PRN
Start: 2023-03-06 — End: 2023-03-07

## 2023-03-06 MED ORDER — OXYCODONE HCL 5 MG PO TABS: 5.0000 mg | ORAL_TABLET | Freq: Once | ORAL | Status: DC | PRN

## 2023-03-06 MED ORDER — CEFAZOLIN SODIUM-DEXTROSE 2-4 GM/100ML-% IV SOLN
2.0000 g | INTRAVENOUS | Status: AC
  Administered 2023-03-06: 2 g via INTRAVENOUS
  Filled 2023-03-06: qty 100

## 2023-03-06 MED ORDER — SUCCINYLCHOLINE CHLORIDE 200 MG/10ML IV SOSY
PREFILLED_SYRINGE | INTRAVENOUS | Status: DC | PRN
  Administered 2023-03-06: 100 mg via INTRAVENOUS

## 2023-03-06 MED ORDER — OXYCODONE HCL 5 MG PO TABS
ORAL_TABLET | ORAL | Status: AC
  Filled 2023-03-06: qty 1

## 2023-03-06 MED ORDER — PROPOFOL 1000 MG/100ML IV EMUL
INTRAVENOUS | Status: AC
  Filled 2023-03-06: qty 100

## 2023-03-06 MED ORDER — HYDROMORPHONE HCL 1 MG/ML IJ SOLN
INTRAMUSCULAR | Status: DC | PRN
  Administered 2023-03-06: .3 mg via INTRAVENOUS

## 2023-03-06 MED ORDER — TRANEXAMIC ACID-NACL 1000-0.7 MG/100ML-% IV SOLN
1000.0000 mg | INTRAVENOUS | Status: AC
  Administered 2023-03-06: 1000 mg via INTRAVENOUS
  Filled 2023-03-06: qty 100

## 2023-03-06 MED ORDER — POLYETHYLENE GLYCOL 3350 17 G PO PACK: 17.0000 g | PACK | Freq: Every day | ORAL | Status: DC | PRN

## 2023-03-06 MED ORDER — FENTANYL CITRATE PF 50 MCG/ML IJ SOSY
PREFILLED_SYRINGE | INTRAMUSCULAR | Status: AC
  Administered 2023-03-06: 50 ug via INTRAVENOUS
  Filled 2023-03-06: qty 3

## 2023-03-06 MED ORDER — METOCLOPRAMIDE HCL 5 MG/ML IJ SOLN: 5.0000 mg | Freq: Three times a day (TID) | INTRAMUSCULAR | Status: DC | PRN

## 2023-03-06 MED ORDER — INSULIN ASPART 100 UNIT/ML IJ SOLN
0.0000 [IU] | Freq: Three times a day (TID) | INTRAMUSCULAR | Status: DC
  Administered 2023-03-06: 3 [IU] via SUBCUTANEOUS
  Administered 2023-03-06: 2 [IU] via SUBCUTANEOUS
  Administered 2023-03-07: 3 [IU] via SUBCUTANEOUS
  Administered 2023-03-07: 2 [IU] via SUBCUTANEOUS

## 2023-03-06 MED ORDER — METOCLOPRAMIDE HCL 5 MG PO TABS: 5.0000 mg | ORAL_TABLET | Freq: Three times a day (TID) | ORAL | Status: DC | PRN

## 2023-03-06 MED ORDER — DEXAMETHASONE SODIUM PHOSPHATE 10 MG/ML IJ SOLN
INTRAMUSCULAR | Status: AC
  Filled 2023-03-06: qty 1

## 2023-03-06 MED ORDER — PHENYLEPHRINE HCL-NACL 20-0.9 MG/250ML-% IV SOLN
INTRAVENOUS | Status: AC
  Filled 2023-03-06: qty 1000

## 2023-03-06 MED ORDER — CEFAZOLIN SODIUM-DEXTROSE 2-3 GM-%(50ML) IV SOLR: INTRAVENOUS | Status: DC | PRN

## 2023-03-06 MED ORDER — ZINC 50 MG PO TABS: 50.0000 mg | ORAL_TABLET | Freq: Every day | ORAL | Status: DC

## 2023-03-06 MED ORDER — OXYCODONE HCL 5 MG/5ML PO SOLN
5.0000 mg | Freq: Once | ORAL | Status: DC | PRN
Start: 2023-03-06 — End: 2023-03-06

## 2023-03-06 MED ORDER — DIPHENHYDRAMINE HCL 12.5 MG/5ML PO ELIX: 12.5000 mg | ORAL_SOLUTION | ORAL | Status: DC | PRN

## 2023-03-06 MED ORDER — FENTANYL CITRATE (PF) 100 MCG/2ML IJ SOLN
INTRAMUSCULAR | Status: DC | PRN
  Administered 2023-03-06 (×2): 50 ug via INTRAVENOUS

## 2023-03-06 MED ORDER — SODIUM CHLORIDE (PF) 0.9 % IJ SOLN
INTRAMUSCULAR | Status: AC
  Filled 2023-03-06: qty 50

## 2023-03-06 MED ORDER — AMISULPRIDE (ANTIEMETIC) 5 MG/2ML IV SOLN: 10.0000 mg | Freq: Once | INTRAVENOUS | Status: AC | PRN

## 2023-03-06 MED ORDER — TRAMADOL HCL 50 MG PO TABS: 50.0000 mg | ORAL_TABLET | Freq: Four times a day (QID) | ORAL | Status: DC | PRN

## 2023-03-06 MED ORDER — SUCCINYLCHOLINE CHLORIDE 200 MG/10ML IV SOSY
PREFILLED_SYRINGE | INTRAVENOUS | Status: AC
Start: 2023-03-06 — End: ?
  Filled 2023-03-06: qty 10

## 2023-03-06 MED ORDER — HYDROMORPHONE HCL 1 MG/ML IJ SOLN: 0.5000 mg | INTRAMUSCULAR | Status: DC | PRN

## 2023-03-06 MED ORDER — DEXAMETHASONE SODIUM PHOSPHATE 10 MG/ML IJ SOLN
10.0000 mg | Freq: Once | INTRAMUSCULAR | Status: AC
  Administered 2023-03-07: 10 mg via INTRAVENOUS
  Filled 2023-03-06: qty 1

## 2023-03-06 MED ORDER — METHOCARBAMOL 1000 MG/10ML IJ SOLN
500.0000 mg | Freq: Four times a day (QID) | INTRAMUSCULAR | Status: DC | PRN
  Administered 2023-03-06: 500 mg via INTRAVENOUS

## 2023-03-06 MED ORDER — BISACODYL 10 MG RE SUPP: 10.0000 mg | Freq: Every day | RECTAL | Status: DC | PRN

## 2023-03-06 MED ORDER — PHENOL 1.4 % MT LIQD: 1.0000 | OROMUCOSAL | Status: DC | PRN

## 2023-03-06 MED ORDER — ONDANSETRON HCL 4 MG PO TABS: 4.0000 mg | ORAL_TABLET | Freq: Four times a day (QID) | ORAL | Status: DC | PRN

## 2023-03-06 MED ORDER — LIDOCAINE HCL (PF) 2 % IJ SOLN
INTRAMUSCULAR | Status: AC
Start: 2023-03-06 — End: ?
  Filled 2023-03-06: qty 15

## 2023-03-06 MED ORDER — METHOCARBAMOL 500 MG PO TABS
500.0000 mg | ORAL_TABLET | Freq: Four times a day (QID) | ORAL | Status: DC | PRN
  Administered 2023-03-07: 500 mg via ORAL
  Filled 2023-03-06: qty 1

## 2023-03-06 MED ORDER — ONDANSETRON HCL 4 MG/2ML IJ SOLN
INTRAMUSCULAR | Status: DC | PRN
  Administered 2023-03-06: 4 mg via INTRAVENOUS

## 2023-03-06 MED ORDER — CHLORHEXIDINE GLUCONATE 0.12 % MT SOLN
15.0000 mL | Freq: Once | OROMUCOSAL | Status: AC
  Administered 2023-03-06: 15 mL via OROMUCOSAL

## 2023-03-06 MED ORDER — ORAL CARE MOUTH RINSE: 15.0000 mL | Freq: Once | OROMUCOSAL | Status: AC

## 2023-03-06 MED ORDER — STERILE WATER FOR IRRIGATION IR SOLN
Status: DC | PRN
  Administered 2023-03-06: 1000 mL

## 2023-03-06 MED ORDER — BUPIVACAINE LIPOSOME 1.3 % IJ SUSP: 20.0000 mL | Freq: Once | INTRAMUSCULAR | Status: DC

## 2023-03-06 MED ORDER — INSULIN ASPART 100 UNIT/ML IJ SOLN: 0.0000 [IU] | INTRAMUSCULAR | Status: DC | PRN

## 2023-03-06 MED ORDER — CEFAZOLIN SODIUM-DEXTROSE 2-4 GM/100ML-% IV SOLN
2.0000 g | Freq: Four times a day (QID) | INTRAVENOUS | Status: AC
  Administered 2023-03-06 (×2): 2 g via INTRAVENOUS
  Filled 2023-03-06 (×2): qty 100

## 2023-03-06 MED ORDER — DOCUSATE SODIUM 100 MG PO CAPS
100.0000 mg | ORAL_CAPSULE | Freq: Two times a day (BID) | ORAL | Status: DC
  Administered 2023-03-06 – 2023-03-07 (×3): 100 mg via ORAL
  Filled 2023-03-06 (×4): qty 1

## 2023-03-06 MED ORDER — LACTATED RINGERS IV SOLN: INTRAVENOUS | Status: DC

## 2023-03-06 MED ORDER — FENTANYL CITRATE (PF) 100 MCG/2ML IJ SOLN
INTRAMUSCULAR | Status: AC
  Filled 2023-03-06: qty 2

## 2023-03-06 MED ORDER — 0.9 % SODIUM CHLORIDE (POUR BTL) OPTIME
TOPICAL | Status: DC | PRN
  Administered 2023-03-06: 1000 mL

## 2023-03-06 MED ORDER — BUPIVACAINE LIPOSOME 1.3 % IJ SUSP
INTRAMUSCULAR | Status: AC
  Filled 2023-03-06: qty 20

## 2023-03-06 MED ORDER — SODIUM CHLORIDE (PF) 0.9 % IJ SOLN
INTRAMUSCULAR | Status: AC
  Filled 2023-03-06: qty 10

## 2023-03-06 MED ORDER — SODIUM CHLORIDE 0.9 % IR SOLN
Status: DC | PRN
  Administered 2023-03-06: 1000 mL

## 2023-03-06 MED ORDER — SUGAMMADEX SODIUM 200 MG/2ML IV SOLN
INTRAVENOUS | Status: DC | PRN
  Administered 2023-03-06: 200 mg via INTRAVENOUS

## 2023-03-06 MED ORDER — DEXTROSE-SODIUM CHLORIDE 5-0.45 % IV SOLN: INTRAVENOUS | Status: AC

## 2023-03-06 MED ORDER — ROPIVACAINE HCL 5 MG/ML IJ SOLN
INTRAMUSCULAR | Status: DC | PRN
  Administered 2023-03-06: 20 mL via PERINEURAL

## 2023-03-06 MED ORDER — POVIDONE-IODINE 10 % EX SWAB
2.0000 | Freq: Once | CUTANEOUS | Status: AC
  Administered 2023-03-06: 2 via TOPICAL

## 2023-03-06 MED ORDER — HYDROMORPHONE HCL 2 MG/ML IJ SOLN
INTRAMUSCULAR | Status: AC
  Filled 2023-03-06: qty 1

## 2023-03-06 MED ORDER — OXYCODONE HCL 5 MG PO TABS
5.0000 mg | ORAL_TABLET | ORAL | Status: DC | PRN
  Administered 2023-03-06 – 2023-03-07 (×4): 5 mg via ORAL
  Administered 2023-03-07: 10 mg via ORAL
  Filled 2023-03-06 (×4): qty 1
  Filled 2023-03-06: qty 2

## 2023-03-06 MED ORDER — ROCURONIUM BROMIDE 10 MG/ML (PF) SYRINGE
PREFILLED_SYRINGE | INTRAVENOUS | Status: AC
Start: 2023-03-06 — End: ?
  Filled 2023-03-06: qty 10

## 2023-03-06 SURGICAL SUPPLY — 44 items
ATTUNE MED DOME PAT 38 KNEE (Knees) IMPLANT
ATTUNE PS FEM RT SZ 8 CEM KNEE (Femur) IMPLANT
ATTUNE PSRP INSR SZ8 8 KNEE (Insert) IMPLANT
BAG COUNTER SPONGE SURGICOUNT (BAG) IMPLANT
BAG ZIPLOCK 12X15 (MISCELLANEOUS) ×2 IMPLANT
BASE TIBIAL ROT PLAT SZ 8 KNEE (Knees) IMPLANT
BLADE SAG 18X100X1.27 (BLADE) ×2 IMPLANT
BLADE SAW SGTL 11.0X1.19X90.0M (BLADE) ×2 IMPLANT
BNDG ELASTIC 6INX 5YD STR LF (GAUZE/BANDAGES/DRESSINGS) ×2 IMPLANT
BOWL SMART MIX CTS (DISPOSABLE) ×2 IMPLANT
CEMENT HV SMART SET (Cement) ×4 IMPLANT
COVER SURGICAL LIGHT HANDLE (MISCELLANEOUS) ×2 IMPLANT
CUFF TRNQT CYL 34X4.125X (TOURNIQUET CUFF) ×2 IMPLANT
DERMABOND ADVANCED .7 DNX12 (GAUZE/BANDAGES/DRESSINGS) ×2 IMPLANT
DRAPE U-SHAPE 47X51 STRL (DRAPES) ×2 IMPLANT
DRSG AQUACEL AG ADV 3.5X10 (GAUZE/BANDAGES/DRESSINGS) ×2 IMPLANT
DURAPREP 26ML APPLICATOR (WOUND CARE) ×2 IMPLANT
ELECT REM PT RETURN 15FT ADLT (MISCELLANEOUS) ×2 IMPLANT
GLOVE BIO SURGEON STRL SZ 6.5 (GLOVE) IMPLANT
GLOVE BIO SURGEON STRL SZ8 (GLOVE) ×2 IMPLANT
GLOVE BIOGEL PI IND STRL 6.5 (GLOVE) IMPLANT
GLOVE BIOGEL PI IND STRL 7.0 (GLOVE) IMPLANT
GLOVE BIOGEL PI IND STRL 8 (GLOVE) ×2 IMPLANT
GOWN STRL REUS W/ TWL LRG LVL3 (GOWN DISPOSABLE) ×2 IMPLANT
HOLDER FOLEY CATH W/STRAP (MISCELLANEOUS) IMPLANT
IMMOBILIZER KNEE 20 (SOFTGOODS) ×1
IMMOBILIZER KNEE 20 THIGH 36 (SOFTGOODS) ×2 IMPLANT
KIT TURNOVER KIT A (KITS) IMPLANT
MANIFOLD NEPTUNE II (INSTRUMENTS) ×2 IMPLANT
NS IRRIG 1000ML POUR BTL (IV SOLUTION) ×2 IMPLANT
PACK TOTAL KNEE CUSTOM (KITS) ×2 IMPLANT
PADDING CAST COTTON 6X4 STRL (CAST SUPPLIES) ×4 IMPLANT
PIN STEINMAN FIXATION KNEE (PIN) IMPLANT
PROTECTOR NERVE ULNAR (MISCELLANEOUS) ×2 IMPLANT
SET HNDPC FAN SPRY TIP SCT (DISPOSABLE) ×2 IMPLANT
SUT MNCRL AB 4-0 PS2 18 (SUTURE) ×2 IMPLANT
SUT STRATAFIX 0 PDS 27 VIOLET (SUTURE) ×1
SUT VIC AB 2-0 CT1 TAPERPNT 27 (SUTURE) ×6 IMPLANT
SUTURE STRATFX 0 PDS 27 VIOLET (SUTURE) ×2 IMPLANT
TIBIAL BASE ROT PLAT SZ 8 KNEE (Knees) ×1 IMPLANT
TRAY FOLEY MTR SLVR 16FR STAT (SET/KITS/TRAYS/PACK) IMPLANT
TUBE SUCTION HIGH CAP CLEAR NV (SUCTIONS) ×2 IMPLANT
WATER STERILE IRR 1000ML POUR (IV SOLUTION) ×4 IMPLANT
WRAP KNEE MAXI GEL POST OP (GAUZE/BANDAGES/DRESSINGS) ×2 IMPLANT

## 2023-03-06 NOTE — Care Plan (Signed)
 Ortho Bundle Case Management Note  Patient Details  Name: Tyler Greer MRN: [id number] Date of Birth: October 07, 1935                  R TKA on 03-06-23  DCP: Home with neighbor, Dickey Hoit  DME: No needs, has a RW  PT: EO on 03-10-23   DME Arranged:  N/A DME Agency:       Additional Comments: Please contact me with any questions of if this plan should need to change.   Kate DELENA Kraft, RN,CCM [facility]  [phone] 03/06/2023, 9:45 AM

## 2023-03-06 NOTE — Op Note (Signed)
 OPERATIVE REPORT-TOTAL KNEE ARTHROPLASTY   Pre-operative diagnosis- Osteoarthritis  Right knee(s)  Post-operative diagnosis- Osteoarthritis Right knee(s)  Procedure-  Right  Total Knee Arthroplasty  Surgeon- Dempsey GAILS. Traven Davids, MD  Assistant- Zelda Kobs, PA-C   Anesthesia-   Adductor canal block and spinal  EBL- 25 ml   Drains None  Tourniquet time-  Total Tourniquet Time Documented: Thigh (Right) - 39 minutes Total: Thigh (Right) - 39 minutes     Complications- None  Condition-PACU - hemodynamically stable.   Brief Clinical Note  Tyler Greer is a 88 y.o. year old male with end stage OA of his right knee with progressively worsening pain and dysfunction. He has constant pain, with activity and at rest and significant functional deficits with difficulties even with ADLs. He has had extensive non-op management including analgesics, injections of cortisone and viscosupplements, and home exercise program, but remains in significant pain with significant dysfunction. Radiographs show bone on bone arthritis lateral and patellofemoral. He presents now for right Total Knee Arthroplasty.     Procedure in detail---   The patient is brought into the operating room and positioned supine on the operating table. After successful administration of  Adductor canal block and spinal,   a tourniquet is placed high on the  Right thigh(s) and the lower extremity is prepped and draped in the usual sterile fashion. Time out is performed by the operating team and then the  Right lower extremity is wrapped in Esmarch, knee flexed and the tourniquet inflated to 300 mmHg.       A midline incision is made with a ten blade through the subcutaneous tissue to the level of the extensor mechanism. A fresh blade is used to make a medial parapatellar arthrotomy. Soft tissue over the proximal medial tibia is subperiosteally elevated to the joint line with a knife and into the semimembranosus bursa with a  Cobb elevator. Soft tissue over the proximal lateral tibia is elevated with attention being paid to avoiding the patellar tendon on the tibial tubercle. The patella is everted, knee flexed 90 degrees and the ACL and PCL are removed. Findings are bone on bone lateral and patellofemoral with large global osteophytes        The drill is used to create a starting hole in the distal femur and the canal is thoroughly irrigated with sterile saline to remove the fatty contents. The 5 degree Right  valgus alignment guide is placed into the femoral canal and the distal femoral cutting block is pinned to remove 10 mm off the distal femur. Resection is made with an oscillating saw.      The tibia is subluxed forward and the menisci are removed. The extramedullary alignment guide is placed referencing proximally at the medial aspect of the tibial tubercle and distally along the second metatarsal axis and tibial crest. The block is pinned to remove 2mm off the more deficient lateral  side. Resection is made with an oscillating saw. Size 8is the most appropriate size for the tibia and the proximal tibia is prepared with the modular drill and keel punch for that size.      The femoral sizing guide is placed and size 8 is most appropriate. Rotation is marked off the epicondylar axis and confirmed by creating a rectangular flexion gap at 90 degrees. The size 8 cutting block is pinned in this rotation and the anterior, posterior and chamfer cuts are made with the oscillating saw. The intercondylar block is then placed and that cut  is made.      Trial size 8 tibial component, trial size 8 posterior stabilized femur and a 8  mm posterior stabilized rotating platform insert trial is placed. Full extension is achieved with excellent varus/valgus and anterior/posterior balance throughout full range of motion. The patella is everted and thickness measured to be 24  mm. Free hand resection is taken to 14 mm, a 38 template is placed, lug  holes are drilled, trial patella is placed, and it tracks normally. Osteophytes are removed off the posterior femur with the trial in place. All trials are removed and the cut bone surfaces prepared with pulsatile lavage. Cement is mixed and once ready for implantation, the size 8 tibial implant, size  8 posterior stabilized femoral component, and the size 38 patella are cemented in place and the patella is held with the clamp. The trial insert is placed and the knee held in full extension. The Exparel  (20 ml mixed with 60 ml saline) is injected into the extensor mechanism, posterior capsule, medial and lateral gutters and subcutaneous tissues.  All extruded cement is removed and once the cement is hard the permanent 8 mm posterior stabilized rotating platform insert is placed into the tibial tray.      The wound is copiously irrigated with saline solution and the extensor mechanism closed with # 0 Stratofix suture. The tourniquet is released for a total tourniquet time of 39  minutes. Flexion against gravity is 140 degrees and the patella tracks normally. Subcutaneous tissue is closed with 2.0 vicryl and subcuticular with running 4.0 Monocryl. The incision is cleaned and dried and steri-strips and a bulky sterile dressing are applied. The limb is placed into a knee immobilizer and the patient is awakened and transported to recovery in stable condition.      Please note that a surgical assistant was a medical necessity for this procedure in order to perform it in a safe and expeditious manner. Surgical assistant was necessary to retract the ligaments and vital neurovascular structures to prevent injury to them and also necessary for proper positioning of the limb to allow for anatomic placement of the prosthesis.   Dempsey ROCKFORD Akeen Ledyard, MD    03/06/2023, 8:33 AM

## 2023-03-06 NOTE — Progress Notes (Signed)
 Orthopedic Tech Progress Note Patient Details:  Tyler Greer [birth year] [id number] CPM removed in PACU due to patient's pain level. CPM Right Knee CPM Right Knee: On Right Knee Flexion (Degrees): 40 Right Knee Extension (Degrees): 10  Post Interventions Patient Tolerated: Well  [name] 03/06/2023, 10:59 AM

## 2023-03-06 NOTE — Anesthesia Procedure Notes (Signed)
 Spinal  Patient location during procedure: OR Start time: 03/06/2023 7:20 AM End time: 03/06/2023 7:27 AM Reason for block: surgical anesthesia Staffing Performed: anesthesiologist  Anesthesiologist: Peggye Delon Brunswick, MD Performed by: Peggye Delon Brunswick, MD Authorized by: Peggye Delon Brunswick, MD   Preanesthetic Checklist Completed: patient identified, IV checked, site marked, risks and benefits discussed, surgical consent, monitors and equipment checked, pre-op evaluation and timeout performed Spinal Block Patient position: sitting Prep: DuraPrep Patient monitoring: blood pressure and continuous pulse ox Approach: midline Location: L3-4 Injection technique: single-shot Needle Needle type: Pencan  Needle gauge: 24 G Needle length: 9 cm Additional Notes Risks and benefits of neuraxial anesthesia including, but not limited to, infection, bleeding, local anesthetic toxicity, headache, hypotension, back pain, block failure, etc. were discussed with the patient. The patient expressed understanding and consented to the procedure. I confirmed that the patient has no bleeding disorders and is not taking blood thinners. I confirmed the patient's last platelet count with the nurse. Monitors were applied. A time-out was performed immediately prior to the procedure. Sterile technique was used throughout the whole procedure.   Unsuccessful attempt at spinal. Not able to obtain CSF.

## 2023-03-06 NOTE — Interval H&P Note (Signed)
 History and Physical Interval Note:  03/06/2023 6:32 AM  Tyler Greer  has presented today for surgery, with the diagnosis of right knee osteoarthritis.  The various methods of treatment have been discussed with the patient and family. After consideration of risks, benefits and other options for treatment, the patient has consented to  Procedure(s): TOTAL KNEE ARTHROPLASTY (Right) as a surgical intervention.  The patient's history has been reviewed, patient examined, no change in status, stable for surgery.  I have reviewed the patient's chart and labs.  Questions were answered to the patient's satisfaction.     Tyler Greer

## 2023-03-06 NOTE — Transfer of Care (Signed)
 Immediate Anesthesia Transfer of Care Note  Patient: Tyler Greer  Procedure(s) Performed: TOTAL KNEE ARTHROPLASTY (Right: Knee)  Patient Location: PACU  Anesthesia Type:General  Level of Consciousness: awake, alert , and oriented  Airway & Oxygen Therapy: Patient Spontanous Breathing and Patient connected to face mask oxygen  Post-op Assessment: Report given to RN and Post -op Vital signs reviewed and stable  Post vital signs: Reviewed and stable  Last Vitals:  Vitals Value Taken Time  BP 152/72 03/06/23 0903  Temp    Pulse 65 03/06/23 0906  Resp 15 03/06/23 0906  SpO2 100 % 03/06/23 0906  Vitals shown include unfiled device data.  Last Pain:  Vitals:   03/06/23 0606  TempSrc: Oral  PainSc:          Complications: No notable events documented.

## 2023-03-06 NOTE — Evaluation (Signed)
 Physical Therapy Evaluation Patient Details Name: Tyler Greer MRN: [id number] DOB: Dec 10, 1935 Today's Date: 03/06/2023  History of Present Illness  88 yo male s/p R TKA on 03/06/23. PMH: L TKA, pseudogout, OA, lumbar laminectomy, DM  Clinical Impression  Pt is s/p TKA resulting in the deficits listed below (see PT Problem List).  Pt agreeable to PT, states his pain is 10/10 but reports it feels good to walk. Pt amb ~ 22' with RW and min assist, anticipate steady progress in acute setting.  Pt will benefit from acute skilled PT to increase their independence and safety with mobility to allow discharge.          If plan is discharge home, recommend the following: A little help with walking and/or transfers;A little help with bathing/dressing/bathroom;Assistance with cooking/housework;Assist for transportation;Help with stairs or ramp for entrance   Can travel by private vehicle        Equipment Recommendations None recommended by PT  Recommendations for Other Services       Functional Status Assessment Patient has had a recent decline in their functional status and demonstrates the ability to make significant improvements in function in a reasonable and predictable amount of time.     Precautions / Restrictions Precautions Precautions: Fall;Knee Required Braces or Orthoses: Knee Immobilizer - Right Knee Immobilizer - Right: Discontinue once straight leg raise with < 10 degree lag Restrictions Weight Bearing Restrictions Per Provider Order: No Other Position/Activity Restrictions: WBAT      Mobility  Bed Mobility Overal bed mobility: Needs Assistance Bed Mobility: Supine to Sit     Supine to sit: Min assist, Contact guard, HOB elevated     General bed mobility comments: incr time, CGA to safety, light assist to initiate movement    Transfers Overall transfer level: Needs assistance Equipment used: Rolling walker (2 wheels) Transfers: Sit to/from Stand Sit to  Stand: Min assist           General transfer comment: assist to rise and stabilize, cues for hand placement    Ambulation/Gait Ambulation/Gait assistance: Min assist Gait Distance (Feet): 75 Feet Assistive device: Rolling walker (2 wheels) Gait Pattern/deviations: Step-to pattern       General Gait Details: cues for sequence, assist to steady and maneuver RW  Stairs            Wheelchair Mobility     Tilt Bed    Modified Rankin (Stroke Patients Only)       Balance Overall balance assessment: Needs assistance Sitting-balance support: No upper extremity supported, Feet supported Sitting balance-Leahy Scale: Fair     Standing balance support: Reliant on assistive device for balance, During functional activity Standing balance-Leahy Scale: Poor                               Pertinent Vitals/Pain Pain Assessment Pain Assessment: 0-10 Pain Score: 10-Worst pain ever Pain Location: right knee Pain Descriptors / Indicators: Aching, Guarding, Sore Pain Intervention(s): Limited activity within patient's tolerance, Monitored during session, Premedicated before session, Repositioned    Home Living Family/patient expects to be discharged to:: Private residence Living Arrangements: Non-relatives/Friends Available Help at Discharge: Neighbor Type of Home: House Home Access: Stairs to enter   Secretary/administrator of Steps: short steps   Home Layout: One level Home Equipment: Agricultural Consultant (2 wheels);Crutches;Grab bars - tub/shower;[place] - single point Additional Comments: lives alone but will d/c to neighbor's home    Prior Function  Prior Level of Function : Independent/Modified Independent                     Extremity/Trunk Assessment   Upper Extremity Assessment Upper Extremity Assessment: Overall WFL for tasks assessed    Lower Extremity Assessment Lower Extremity Assessment: RLE deficits/detail RLE Deficits / Details: ankle WFL,  knee extension and hip flexion 2+/5       Communication   Communication Communication: Hearing impairment  Cognition Arousal: Alert Behavior During Therapy: WFL for tasks assessed/performed Overall Cognitive Status: Impaired/Different from baseline Area of Impairment: Memory                     Memory: Decreased short-term memory                  General Comments      Exercises     Assessment/Plan    PT Assessment Patient needs continued PT services  PT Problem List Decreased strength;Decreased range of motion;Decreased activity tolerance;Decreased mobility;Decreased knowledge of precautions;Decreased knowledge of use of DME;Pain       PT Treatment Interventions DME instruction;Gait training;Functional mobility training;Therapeutic activities;Therapeutic exercise;Stair training;Patient/family education    PT Goals (Current goals can be found in the Care Plan section)  Acute Rehab PT Goals PT Goal Formulation: With patient Time For Goal Achievement: 03/20/23 Potential to Achieve Goals: Good    Frequency 7X/week     Co-evaluation               AM-PAC PT 6 Clicks Mobility  Outcome Measure Help needed turning from your back to your side while in a flat bed without using bedrails?: A Little Help needed moving from lying on your back to sitting on the side of a flat bed without using bedrails?: A Little Help needed moving to and from a bed to a chair (including a wheelchair)?: A Little Help needed standing up from a chair using your arms (e.g., wheelchair or bedside chair)?: A Little Help needed to walk in hospital room?: A Little Help needed climbing 3-5 steps with a railing? : A Lot 6 Click Score: 17    End of Session Equipment Utilized During Treatment: Gait belt Activity Tolerance: Patient tolerated treatment well Patient left: in chair;with call bell/phone within reach;with chair alarm set;with family/visitor present Nurse Communication:  Mobility status PT Visit Diagnosis: Other abnormalities of gait and mobility (R26.89);Difficulty in walking, not elsewhere classified (R26.2)    Time: 8399-8376 PT Time Calculation (min) (ACUTE ONLY): 23 min   Charges:   PT Evaluation $PT Eval Low Complexity: 1 Low PT Treatments $Gait Training: 8-22 mins PT General Charges $$ ACUTE PT VISIT: 1 Visit         Kyliyah Stirn, PT  Acute Rehab Dept [facility]) [phone]  03/06/2023   [facility] 03/06/2023, 5:09 PM

## 2023-03-06 NOTE — Anesthesia Procedure Notes (Signed)
 Procedure Name: General with mask airway Date/Time: 03/06/2023 7:17 AM  Performed by: Harl Armida PARAS, CRNAPre-anesthesia Checklist: Patient identified, Emergency Drugs available, Suction available, Patient being monitored and Timeout performed Patient Re-evaluated:Patient Re-evaluated prior to induction Oxygen Delivery Method: Circle system utilized Preoxygenation: Pre-oxygenation with 100% oxygen Induction Type: IV induction Placement Confirmation: positive ETCO2

## 2023-03-06 NOTE — Progress Notes (Signed)
 Orthopedic Tech Progress Note Patient Details:  Tyler Greer 04/25/1935 [id number] CPM will be removed at 1:30pm.  CPM Right Knee CPM Right Knee: On Right Knee Flexion (Degrees): 40 Right Knee Extension (Degrees): 10  Post Interventions Patient Tolerated: Well  Tyler Greer 03/06/2023, 9:27 AM

## 2023-03-06 NOTE — Anesthesia Procedure Notes (Signed)
 Procedure Name: Intubation Date/Time: 03/06/2023 7:36 AM  Performed by: Harl Armida PARAS, CRNAPre-anesthesia Checklist: Patient identified, Emergency Drugs available, Suction available, Patient being monitored and Timeout performed Patient Re-evaluated:Patient Re-evaluated prior to induction Oxygen Delivery Method: Circle system utilized Preoxygenation: Pre-oxygenation with 100% oxygen Induction Type: IV induction Ventilation: Mask ventilation without difficulty Laryngoscope Size: Mac and 3 Grade View: Grade II Tube type: Oral Tube size: 7.5 mm Number of attempts: 1 Airway Equipment and Method: Stylet Placement Confirmation: ETT inserted through vocal cords under direct vision, positive ETCO2 and breath sounds checked- equal and bilateral Secured at: 23 cm Tube secured with: Tape Dental Injury: Teeth and Oropharynx as per pre-operative assessment

## 2023-03-06 NOTE — Anesthesia Procedure Notes (Signed)
 Anesthesia Regional Block: Adductor canal block   Pre-Anesthetic Checklist: , timeout performed,  Correct Patient, Correct Site, Correct Laterality,  Correct Procedure, Correct Position, site marked,  Risks and benefits discussed,  Surgical consent,  Pre-op evaluation,  At surgeon's request and post-op pain management  Laterality: Right  Prep: chloraprep       Needles:  Injection technique: Single-shot  Needle Type: Echogenic Stimulator Needle     Needle Length: 9cm  Needle Gauge: 21     Additional Needles:   Procedures:,,,, ultrasound used (permanent image in chart),,    Narrative:  Start time: 03/06/2023 6:56 AM End time: 03/06/2023 6:59 AM Injection made incrementally with aspirations every 5 mL.  Performed by: Personally  Anesthesiologist: Peggye Delon Brunswick, MD  Additional Notes: Discussed risks and benefits of nerve block including, but not limited to, prolonged and/or permanent nerve injury involving sensory and/or motor function. Monitors were applied and a time-out was performed. The nerve and associated structures were visualized under ultrasound guidance. After negative aspiration, local anesthetic was slowly injected around the nerve. There was no evidence of high pressure during the procedure. There were no paresthesias. VSS remained stable and the patient tolerated the procedure well.

## 2023-03-06 NOTE — Anesthesia Postprocedure Evaluation (Signed)
 Anesthesia Post Note  Patient: Tyler Greer  Procedure(s) Performed: TOTAL KNEE ARTHROPLASTY (Right: Knee)     Patient location during evaluation: PACU Anesthesia Type: General Level of consciousness: awake Pain management: pain level controlled Vital Signs Assessment: post-procedure vital signs reviewed and stable Respiratory status: spontaneous breathing, nonlabored ventilation and respiratory function stable Cardiovascular status: blood pressure returned to baseline and stable Postop Assessment: no apparent nausea or vomiting Anesthetic complications: no   No notable events documented.  Last Vitals:  Vitals:   03/06/23 1155 03/06/23 1419  BP: (!) 154/77 (!) 156/82  Pulse: 63 (!) 59  Resp: 14 17  Temp: 36.5 C 36.5 C  SpO2: 100% 100%    Last Pain:  Vitals:   03/06/23 1321  TempSrc:   PainSc: Asleep                 Delon Aisha Arch

## 2023-03-06 NOTE — Discharge Instructions (Addendum)
 Tyler Aluisio, MD Total Joint Specialist [facility] [address] [place], [place] [address] [phone]  TOTAL KNEE REPLACEMENT POSTOPERATIVE DIRECTIONS    Knee Rehabilitation, Guidelines Following Surgery  Results after knee surgery are often greatly improved when you follow the exercise, range of motion and muscle strengthening exercises prescribed by your doctor. Safety measures are also important to protect the knee from further injury. If any of these exercises cause you to have increased pain or swelling in your knee joint, decrease the amount until you are comfortable again and slowly increase them. If you have problems or questions, call your caregiver or physical therapist for advice.   HOME CARE INSTRUCTIONS  Remove items at home which could result in a fall. This includes throw rugs or furniture in walking pathways.  ICE to the affected knee as much as tolerated. Icing helps control swelling. If the swelling is well controlled you will be more comfortable and rehab easier. Continue to use ice on the knee for pain and swelling from surgery. You may notice swelling that will progress down to the foot and ankle. This is normal after surgery. Elevate the leg when you are not up walking on it.    Continue to use the breathing machine which will help keep your temperature down. It is common for your temperature to cycle up and down following surgery, especially at night when you are not up moving around and exerting yourself. The breathing machine keeps your lungs expanded and your temperature down. Do not place pillow under the operative knee, focus on keeping the knee straight while resting  DIET You may resume your previous home diet once you are discharged from the hospital.  DRESSING / WOUND CARE / SHOWERING Keep your bulky bandage on for 2 days. On the third post-operative day you may remove the Ace bandage and gauze. There is a waterproof  adhesive bandage on your skin which will stay in place until your first follow-up appointment. Once you remove this you will not need to place another bandage You may begin showering 3 days following surgery, but do not submerge the incision under water.  ACTIVITY For the first 5 days, the key is rest and control of pain and swelling Do your home exercises twice a day starting on post-operative day 3. On the days you go to physical therapy, just do the home exercises once that day. You should rest, ice and elevate the leg for 50 minutes out of every hour. Get up and walk/stretch for 10 minutes per hour. After 5 days you can increase your activity slowly as tolerated. Walk with your walker as instructed. Use the walker until you are comfortable transitioning to a cane. Walk with the cane in the opposite hand of the operative leg. You may discontinue the cane once you are comfortable and walking steadily. Avoid periods of inactivity such as sitting longer than an hour when not asleep. This helps prevent blood clots.  You may discontinue the knee immobilizer once you are able to perform a straight leg raise while lying down. You may resume a sexual relationship in one month or when given the OK by your doctor.  You may return to work once you are cleared by your doctor.  Do not drive a car for 6 weeks or until released by your surgeon.  Do not drive while taking narcotics.  TED HOSE STOCKINGS Wear the elastic stockings on both legs for three weeks following surgery during the   day. You may remove them at night for sleeping.  WEIGHT BEARING Weight bearing as tolerated with assist device (walker, cane, etc) as directed, use it as long as suggested by your surgeon or therapist, typically at least 4-6 weeks.  POSTOPERATIVE CONSTIPATION PROTOCOL Constipation - defined medically as fewer than three stools per week and severe constipation as less than one stool per week.  One of the most common issues  patients have following surgery is constipation.  Even if you have a regular bowel pattern at home, your normal regimen is likely to be disrupted due to multiple reasons following surgery.  Combination of anesthesia, postoperative narcotics, change in appetite and fluid intake all can affect your bowels.  In order to avoid complications following surgery, here are some recommendations in order to help you during your recovery period.  Colace (docusate) - Pick up an over-the-counter form of Colace or another stool softener and take twice a day as long as you are requiring postoperative pain medications.  Take with a full glass of water daily.  If you experience loose stools or diarrhea, hold the colace until you stool forms back up. If your symptoms do not get better within 1 week or if they get worse, check with your doctor. Dulcolax (bisacodyl) - Pick up over-the-counter and take as directed by the product packaging as needed to assist with the movement of your bowels.  Take with a full glass of water.  Use this product as needed if not relieved by Colace only.  MiraLax (polyethylene glycol) - Pick up over-the-counter to have on hand. MiraLax is a solution that will increase the amount of water in your bowels to assist with bowel movements.  Take as directed and can mix with a glass of water, juice, soda, coffee, or tea. Take if you go more than two days without a movement. Do not use MiraLax more than once per day. Call your doctor if you are still constipated or irregular after using this medication for 7 days in a row.  If you continue to have problems with postoperative constipation, please contact the office for further assistance and recommendations.  If you experience "the worst abdominal pain ever" or develop nausea or vomiting, please contact the office immediatly for further recommendations for treatment.  ITCHING If you experience itching with your medications, try taking only a single pain  pill, or even half a pain pill at a time.  You can also use Benadryl over the counter for itching or also to help with sleep.   MEDICATIONS See your medication summary on the "After Visit Summary" that the nursing staff will review with you prior to discharge.  You may have some home medications which will be placed on hold until you complete the course of blood thinner medication.  It is important for you to complete the blood thinner medication as prescribed by your surgeon.  Continue your approved medications as instructed at time of discharge.  PRECAUTIONS If you experience chest pain or shortness of breath - call 911 immediately for transfer to the hospital emergency department.  If you develop a fever greater that 101 F, purulent drainage from wound, increased redness or drainage from wound, foul odor from the wound/dressing, or calf pain - CONTACT YOUR SURGEON.                                                     FOLLOW-UP APPOINTMENTS Make sure you keep all of your appointments after your operation with your surgeon and caregivers. You should call the office at the above phone number and make an appointment for approximately two weeks after the date of your surgery or on the date instructed by your surgeon outlined in the "After Visit Summary".  RANGE OF MOTION AND STRENGTHENING EXERCISES  Rehabilitation of the knee is important following a knee injury or an operation. After just a few days of immobilization, the muscles of the thigh which control the knee become weakened and shrink (atrophy). Knee exercises are designed to build up the tone and strength of the thigh muscles and to improve knee motion. Often times heat used for twenty to thirty minutes before working out will loosen up your tissues and help with improving the range of motion but do not use heat for the first two weeks following surgery. These exercises can be done on a training (exercise) mat, on the floor, on a table or on a bed.  Use what ever works the best and is most comfortable for you Knee exercises include:  Leg Lifts - While your knee is still immobilized in a splint or cast, you can do straight leg raises. Lift the leg to 60 degrees, hold for 3 sec, and slowly lower the leg. Repeat 10-20 times 2-3 times daily. Perform this exercise against resistance later as your knee gets better.  Quad and Hamstring Sets - Tighten up the muscle on the front of the thigh (Quad) and hold for 5-10 sec. Repeat this 10-20 times hourly. Hamstring sets are done by pushing the foot backward against an object and holding for 5-10 sec. Repeat as with quad sets.  Leg Slides: Lying on your back, slowly slide your foot toward your buttocks, bending your knee up off the floor (only go as far as is comfortable). Then slowly slide your foot back down until your leg is flat on the floor again. Angel Wings: Lying on your back spread your legs to the side as far apart as you can without causing discomfort.  A rehabilitation program following serious knee injuries can speed recovery and prevent re-injury in the future due to weakened muscles. Contact your doctor or a physical therapist for more information on knee rehabilitation.   POST-OPERATIVE OPIOID TAPER INSTRUCTIONS: It is important to wean off of your opioid medication as soon as possible. If you do not need pain medication after your surgery it is ok to stop day one. Opioids include: Codeine, Hydrocodone(Norco, Vicodin), Oxycodone(Percocet, oxycontin) and hydromorphone amongst others.  Long term and even short term use of opiods can cause: Increased pain response Dependence Constipation Depression Respiratory depression And more.  Withdrawal symptoms can include Flu like symptoms Nausea, vomiting And more Techniques to manage these symptoms Hydrate well Eat regular healthy meals Stay active Use relaxation techniques(deep breathing, meditating, yoga) Do Not substitute Alcohol to help  with tapering If you have been on opioids for less than two weeks and do not have pain than it is ok to stop all together.  Plan to wean off of opioids This plan should start within one week post op of your joint replacement. Maintain the same interval or time between taking each dose and first decrease the dose.  Cut the total daily intake of opioids by one tablet each day Next start to increase the time between doses. The last dose that should be eliminated is the evening dose.   IF YOU ARE TRANSFERRED TO   A SKILLED REHAB FACILITY If the patient is transferred to a skilled rehab facility following release from the hospital, a list of the current medications will be sent to the facility for the patient to continue.  When discharged from the skilled rehab facility, please have the facility set up the patient's [place] prior to being released. Also, the skilled facility will be responsible for providing the patient with their medications at time of release from the facility to include their pain medication, the muscle relaxants, and their blood thinner medication. If the patient is still at the rehab facility at time of the two week follow up appointment, the skilled rehab facility will also need to assist the patient in arranging follow up appointment in our office and any transportation needs.  MAKE SURE YOU:  Understand these instructions.  Get help right away if you are not doing well or get worse.   DENTAL ANTIBIOTICS:  In most cases prophylactic antibiotics for Dental procdeures after total joint surgery are not necessary.  Exceptions are as follows:  1. History of prior total joint infection  2. Severely immunocompromised (Organ Transplant, cancer chemotherapy, Rheumatoid biologic medications such as [place])  3. Poorly controlled diabetes (A1C &gt; 8.0, blood glucose over 200)  If you have one of these conditions, contact your surgeon for an antibiotic  prescription, prior to your dental procedure.    Pick up stool softner and laxative for home use following surgery while on pain medications. Do not submerge incision under water. Please use good hand washing techniques while changing dressing each day. May shower starting three days after surgery. Please use a clean towel to pat the incision dry following showers. Continue to use ice for pain and swelling after surgery. Do not use any lotions or creams on the incision until instructed by your surgeon.      Information on my medicine - XARELTO (Rivaroxaban)  Why was Xarelto prescribed for you? Xarelto was prescribed for you to reduce the risk of blood clots forming after orthopedic surgery. The medical term for these abnormal blood clots is venous thromboembolism (VTE).  What do you need to know about xarelto ? Take your Xarelto ONCE DAILY at the same time every day. You may take it either with or without food.  If you have difficulty swallowing the tablet whole, you may crush it and mix in applesauce just prior to taking your dose.  Take Xarelto exactly as prescribed by your doctor and DO NOT stop taking Xarelto without talking to the doctor who prescribed the medication.  Stopping without other VTE prevention medication to take the place of Xarelto may increase your risk of developing a clot.  After discharge, you should have regular check-up appointments with your healthcare provider that is prescribing your Xarelto.    What do you do if you miss a dose? If you miss a dose, take it as soon as you remember on the same day then continue your regularly scheduled once daily regimen the next day. Do not take two doses of Xarelto on the same day.   Important Safety Information A possible side effect of Xarelto is bleeding. You should call your healthcare provider right away if you experience any of the following: Bleeding from an injury or your nose that does not  stop. Unusual colored urine (red or dark brown) or unusual colored stools (red or black). Unusual bruising for unknown reasons. A serious fall or if you hit your head (even if there  is no bleeding).  Some medicines may interact with Xarelto and might increase your risk of bleeding while on Xarelto. To help avoid this, consult your healthcare provider or pharmacist prior to using any new prescription or non-prescription medications, including herbals, vitamins, non-steroidal anti-inflammatory drugs (NSAIDs) and supplements.  This website has more information on Xarelto: [email or web address]

## 2023-03-07 ENCOUNTER — Encounter (HOSPITAL_COMMUNITY): Payer: Self-pay | Admitting: Orthopedic Surgery

## 2023-03-07 ENCOUNTER — Other Ambulatory Visit (HOSPITAL_COMMUNITY): Payer: Self-pay

## 2023-03-07 DIAGNOSIS — Z85828 Personal history of other malignant neoplasm of skin: Secondary | ICD-10-CM | POA: Diagnosis not present

## 2023-03-07 DIAGNOSIS — Z96652 Presence of left artificial knee joint: Secondary | ICD-10-CM | POA: Diagnosis not present

## 2023-03-07 DIAGNOSIS — E1140 Type 2 diabetes mellitus with diabetic neuropathy, unspecified: Secondary | ICD-10-CM | POA: Diagnosis not present

## 2023-03-07 DIAGNOSIS — M1711 Unilateral primary osteoarthritis, right knee: Secondary | ICD-10-CM | POA: Diagnosis not present

## 2023-03-07 DIAGNOSIS — Z7984 Long term (current) use of oral hypoglycemic drugs: Secondary | ICD-10-CM | POA: Diagnosis not present

## 2023-03-07 LAB — BASIC METABOLIC PANEL
Anion gap: 8 (ref 5–15)
BUN: 27 mg/dL — ABNORMAL HIGH (ref 8–23)
CO2: 23 mmol/L (ref 22–32)
Calcium: 8.3 mg/dL — ABNORMAL LOW (ref 8.9–10.3)
Chloride: 102 mmol/L (ref 98–111)
Creatinine, Ser: 1.47 mg/dL — ABNORMAL HIGH (ref 0.61–1.24)
GFR, Estimated: 46 mL/min — ABNORMAL LOW (ref >60)
Glucose, Bld: 179 mg/dL — ABNORMAL HIGH (ref 70–99)
Potassium: 4.4 mmol/L (ref 3.5–5.1)
Sodium: 133 mmol/L — ABNORMAL LOW (ref 135–145)

## 2023-03-07 LAB — CBC
HCT: 33.3 % — ABNORMAL LOW (ref 39.0–52.0)
Hemoglobin: 11 g/dL — ABNORMAL LOW (ref 13.0–17.0)
MCH: 30.2 pg (ref 26.0–34.0)
MCHC: 33 g/dL (ref 30.0–36.0)
MCV: 91.5 fL (ref 80.0–100.0)
Platelets: 259 10*3/uL (ref 150–400)
RBC: 3.64 MIL/uL — ABNORMAL LOW (ref 4.22–5.81)
RDW: 13.2 % (ref 11.5–15.5)
WBC: 14.5 10*3/uL — ABNORMAL HIGH (ref 4.0–10.5)
nRBC: 0 % (ref 0.0–0.2)

## 2023-03-07 LAB — GLUCOSE, CAPILLARY
Glucose-Capillary: 147 mg/dL — ABNORMAL HIGH (ref 70–99)
Glucose-Capillary: 167 mg/dL — ABNORMAL HIGH (ref 70–99)

## 2023-03-07 MED ORDER — ONDANSETRON HCL 4 MG PO TABS
4.0000 mg | ORAL_TABLET | Freq: Four times a day (QID) | ORAL | 0 refills | Status: DC | PRN
  Filled 2023-03-07: qty 20, 5d supply, fill #0

## 2023-03-07 MED ORDER — RIVAROXABAN 10 MG PO TABS
10.0000 mg | ORAL_TABLET | Freq: Every day | ORAL | 0 refills | Status: AC
  Filled 2023-03-07: qty 20, 20d supply, fill #0

## 2023-03-07 MED ORDER — OXYCODONE HCL 5 MG PO TABS
5.0000 mg | ORAL_TABLET | Freq: Four times a day (QID) | ORAL | 0 refills | Status: DC | PRN
  Filled 2023-03-07: qty 42, 6d supply, fill #0

## 2023-03-07 MED ORDER — METHOCARBAMOL 500 MG PO TABS
500.0000 mg | ORAL_TABLET | Freq: Four times a day (QID) | ORAL | 0 refills | Status: DC | PRN
  Filled 2023-03-07: qty 40, 10d supply, fill #0

## 2023-03-07 MED ORDER — TRAMADOL HCL 50 MG PO TABS
50.0000 mg | ORAL_TABLET | Freq: Four times a day (QID) | ORAL | 0 refills | Status: DC | PRN
  Filled 2023-03-07: qty 40, 5d supply, fill #0

## 2023-03-07 NOTE — Progress Notes (Signed)
 TOC meds delivered  in a secure bag by this RN to pt  in room

## 2023-03-07 NOTE — TOC Transition Note (Signed)
 Transition of Care [facility]) - Discharge Note   Patient Details  Name: Tyler Greer MRN: [id number] Date of Birth: 05-30-1935  [facility] CM/SW Contact:  NORMAN ASPEN, LCSW Phone Number: 03/07/2023, 9:37 AM   Clinical Narrative:     Met with pt and confirming he has needed DME in the home.  OPPT already arranged with [facility].  No further TOC needs.  Final next level of care: OP Rehab Barriers to Discharge: No Barriers Identified   Patient Goals and CMS Choice Patient states their goals for this hospitalization and ongoing recovery are:: return home          Discharge Placement                       Discharge Plan and Services Additional resources added to the After Visit Summary for                  DME Arranged: N/A                    Social Drivers of Health (SDOH) Interventions SDOH Screenings   Food Insecurity: No Food Insecurity (03/06/2023)  Housing: Low Risk  (03/06/2023)  Transportation Needs: No Transportation Needs (03/06/2023)  Utilities: Not At Risk (03/06/2023)  Alcohol Screen: Low Risk  (04/20/2022)  Depression (PHQ2-9): Low Risk  (06/06/2022)  Financial Resource Strain: Low Risk  (04/20/2022)  Physical Activity: Sufficiently Active (04/20/2022)  Social Connections: Socially Isolated (03/06/2023)  Stress: No Stress Concern Present (04/20/2022)  Tobacco Use: Low Risk  (03/06/2023)     Readmission Risk Interventions     No data to display

## 2023-03-07 NOTE — Progress Notes (Signed)
 PT TX NOTE  03/07/23 1500  PT Visit Information  Last PT Received On 03/07/23  Assistance Needed Pt is making excellent progress, meeting goals. Pt and caregiver feel ready to d/c home today, PT in agreement. Plan is for OPPT at d/c  History of Present Illness 88 yo male s/p R TKA on 03/06/23. PMH: L TKA, pseudogout, OA, lumbar laminectomy, DM  Precautions  Precautions Fall;Knee  Precaution Comments knee of bed not locked out by staff; corrected and educated pt  regarding terminal knee extension; IND SLRs  Restrictions  Weight Bearing Restrictions Per Provider Order No  Other Position/Activity Restrictions WBAT  Pain Assessment  Pain Assessment 0-10  Pain Score 4  Pain Location right knee  Pain Descriptors / Indicators Aching;Guarding;Sore  Pain Intervention(s) Limited activity within patient's tolerance;Monitored during session;Premedicated before session;Repositioned  Cognition  Arousal Alert  Behavior During Therapy WFL for tasks assessed/performed  Overall Cognitive Status Within Functional Limits for tasks assessed  Transfers  Overall transfer level Needs assistance  Equipment used Rolling walker (2 wheels)  Transfers Sit to/from Stand  Sit to Stand Contact guard assist;Supervision  General transfer comment cues for hand placement and overall safety  Ambulation/Gait  Ambulation/Gait assistance Contact guard assist  Gait Distance (Feet) 120 Feet  Assistive device Rolling walker (2 wheels)  Gait Pattern/deviations Step-to pattern;Step-through pattern  General Gait Details cues for sequence, CGA  for safety; no LOB. beginning step through pattern end of distance without incr pain  Stairs Yes  Stairs assistance Contact guard assist  Stair Management One rail Right;Sideways  Number of Stairs 3  General stair comments cues for sequence, quad activation R knee for stability  and single hand rail to simulate post at home. no knee buckling or LOB. we did discuss use of cane and  post at home if incr pain however tol well today with unilateral UE support  Balance  Sitting balance-Leahy Scale Good  Standing balance support During functional activity;Reliant on assistive device for balance  Standing balance-Leahy Scale Fair  PT - End of Session  Equipment Utilized During Treatment Gait belt  Activity Tolerance Patient tolerated treatment well  Patient left in chair;with call bell/phone within reach;with chair alarm set;with family/visitor present   PT - Assessment/Plan  PT Visit Diagnosis Other abnormalities of gait and mobility (R26.89);Difficulty in walking, not elsewhere classified (R26.2)  PT Frequency (ACUTE ONLY) 7X/week  Follow Up Recommendations Follow physician's recommendations for discharge plan and follow up therapies  Patient can return home with the following A little help with walking and/or transfers;A little help with bathing/dressing/bathroom;Assistance with cooking/housework;Assist for transportation;Help with stairs or ramp for entrance  PT equipment None recommended by PT  AM-PAC PT 6 Clicks Mobility Outcome Measure (Version 2)  Help needed turning from your back to your side while in a flat bed without using bedrails? 3  Help needed moving from lying on your back to sitting on the side of a flat bed without using bedrails? 3  Help needed moving to and from a bed to a chair (including a wheelchair)? 3  Help needed standing up from a chair using your arms (e.g., wheelchair or bedside chair)? 3  Help needed to walk in hospital room? 3  Help needed climbing 3-5 steps with a railing?  3  6 Click Score 18  Consider Recommendation of Discharge To: Home with [facility]  PT Goal Progression  Progress towards PT goals Progressing toward goals  Acute Rehab PT Goals  PT Goal Formulation With patient  Time For Goal Achievement 03/20/23  Potential to Achieve Goals Good  PT Time Calculation  PT Start Time (ACUTE ONLY) 1445  PT Stop Time (ACUTE ONLY) 1508  PT  Time Calculation (min) (ACUTE ONLY) 23 min  PT General Charges  $$ ACUTE PT VISIT 1 Visit  PT Treatments  $Gait Training 23-37 mins

## 2023-03-07 NOTE — Progress Notes (Signed)
   Subjective: 1 Day Post-Op Procedure(s) (LRB): TOTAL KNEE ARTHROPLASTY (Right) Patient seen in rounds by Dr. Melodi. Patient is well, and has had no acute complaints or problems. Denies SOB or chest pain. Denies calf pain. Patient reports pain as moderate. Worked with physical therapy yesterday and ambulated 75'. We will continue physical therapy today. Patient is eager to discharge home.  Objective: Vital signs in last 24 hours: Temp:  [97.7 F (36.5 C)-98.7 F (37.1 C)] 97.9 F (36.6 C) (01/14 0502) Pulse Rate:  [53-72] 66 (01/14 0502) Resp:  [7-18] 18 (01/14 0502) BP: (123-160)/(59-84) 123/68 (01/14 0502) SpO2:  [94 %-100 %] 99 % (01/14 0502) Weight:  [94.3 kg] 94.3 kg (01/13 1155)  Intake/Output from previous day:  Intake/Output Summary (Last 24 hours) at 03/07/2023 0720 Last data filed at 03/07/2023 9388 Gross per 24 hour  Intake 2857.52 ml  Output 2130 ml  Net 727.52 ml     Intake/Output this shift: No intake/output data recorded.  Labs: Recent Labs    03/07/23 0333  HGB 11.0*   Recent Labs    03/07/23 0333  WBC 14.5*  RBC 3.64*  HCT 33.3*  PLT 259   Recent Labs    03/07/23 0333  NA 133*  K 4.4  CL 102  CO2 23  BUN 27*  CREATININE 1.47*  GLUCOSE 179*  CALCIUM  8.3*   No results for input(s): LABPT, INR in the last 72 hours.  Exam: General - Patient is Alert and Oriented Extremity - Neurologically intact Neurovascular intact Sensation intact distally Dorsiflexion/Plantar flexion intact Dressing - dressing C/D/I Motor Function - intact, moving foot and toes well on exam.  Past Medical History:  Diagnosis Date   Arthritis    hands and knees (06/10/2015)   Basal cell carcinoma, arm    both   Basal cell carcinoma, face    Chest pain    Diabetes mellitus type II 10/23/2007   Intention tremor    Migraine    none in years (06/10/2015)   Osteoarthritis    Paresthesia of foot    bilateral   Stroke (HCC) 02/22/2003   mini stroke     Assessment/Plan: 1 Day Post-Op Procedure(s) (LRB): TOTAL KNEE ARTHROPLASTY (Right) Principal Problem:   OA (osteoarthritis) of knee  Estimated body mass index is 29.01 kg/m as calculated from the following:   Height as of this encounter: 5' 11 (1.803 m).   Weight as of this encounter: 94.3 kg. Advance diet Up with therapy D/C IV fluids  Patient's anticipated LOS is less than 2 midnights, meeting these requirements: - Lives within 1 hour of care - Has a competent adult at home to recover with post-op - NO history of  - Chronic pain requiring opioids  - Coronary Artery Disease  - Heart failure  - Heart attack  - DVT/VTE  - Cardiac arrhythmia  - Respiratory Failure/COPD  - Renal failure  - Anemia  - Advanced Liver disease  DVT Prophylaxis - Xarelto  Weight bearing as tolerated.  Continue physical therapy. Expected discharge home today pending progress and if meeting patient goals. Scheduled for OPPT at [facility]. Follow-up in clinic in 2 weeks.  The PDMP database was reviewed today prior to any opioid medications being prescribed to this patient.  R. Zelda Kobs, PA-C Orthopedic Surgery [phone] 03/07/2023, 7:20 AM

## 2023-03-07 NOTE — Progress Notes (Signed)
 Physical Therapy Treatment Patient Details Name: Tyler Greer MRN: [id number] DOB: 05-Mar-1935 Today's Date: 03/07/2023   History of Present Illness 88 yo male s/p R TKA on 03/06/23. PMH: L TKA, pseudogout, OA, lumbar laminectomy, DM    PT Comments  Pt progressing steadily this session. Will see in pm and  pt will likely be ready to d/c later today     If plan is discharge home, recommend the following: A little help with walking and/or transfers;A little help with bathing/dressing/bathroom;Assistance with cooking/housework;Assist for transportation;Help with stairs or ramp for entrance   Can travel by private vehicle        Equipment Recommendations  None recommended by PT    Recommendations for Other Services       Precautions / Restrictions Precautions Precautions: Fall;Knee Precaution Comments: knee of bed not locked out by staff; corrected and educated pt  regarding terminal knee extension; IND SLRs Restrictions Weight Bearing Restrictions Per Provider Order: No Other Position/Activity Restrictions: WBAT     Mobility  Bed Mobility Overal bed mobility: Needs Assistance Bed Mobility: Supine to Sit     Supine to sit: Supervision     General bed mobility comments: for safety    Transfers Overall transfer level: Needs assistance Equipment used: Rolling walker (2 wheels) Transfers: Sit to/from Stand Sit to Stand: Contact guard assist           General transfer comment: cues for hand placement and overall safety    Ambulation/Gait Ambulation/Gait assistance: Contact guard assist Gait Distance (Feet): 85 Feet Assistive device: Rolling walker (2 wheels) Gait Pattern/deviations: Step-to pattern       General Gait Details: cues for sequence, CGA  for safety   Stairs             Wheelchair Mobility     Tilt Bed    Modified Rankin (Stroke Patients Only)       Balance                                             Cognition Arousal: Alert Behavior During Therapy: WFL for tasks assessed/performed Overall Cognitive Status: Within Functional Limits for tasks assessed                                 General Comments: occaisonal redirection        Exercises Total Joint Exercises Ankle Circles/Pumps: AROM, Both, 10 reps Quad Sets: AROM, Both, 10 reps Heel Slides: AAROM, Right, 10 reps Straight Leg Raises: AAROM, Right, AROM, 10 reps    General Comments        Pertinent Vitals/Pain Pain Assessment Pain Assessment: 0-10 Pain Score: 6  Pain Location: right knee Pain Descriptors / Indicators: Aching, Guarding, Sore Pain Intervention(s): Limited activity within patient's tolerance, Monitored during session, Premedicated before session, Repositioned, Ice applied    Home Living                          Prior Function            PT Goals (current goals can now be found in the care plan section) Acute Rehab PT Goals PT Goal Formulation: With patient Time For Goal Achievement: 03/20/23 Potential to Achieve Goals: Good Progress towards PT goals: Progressing toward goals  Frequency    7X/week      PT Plan      Co-evaluation              AM-PAC PT 6 Clicks Mobility   Outcome Measure  Help needed turning from your back to your side while in a flat bed without using bedrails?: A Little Help needed moving from lying on your back to sitting on the side of a flat bed without using bedrails?: A Little Help needed moving to and from a bed to a chair (including a wheelchair)?: A Little Help needed standing up from a chair using your arms (e.g., wheelchair or bedside chair)?: A Little Help needed to walk in hospital room?: A Little Help needed climbing 3-5 steps with a railing? : A Little 6 Click Score: 18    End of Session Equipment Utilized During Treatment: Gait belt Activity Tolerance: Patient tolerated treatment well Patient left: in chair;with  call bell/phone within reach;with chair alarm set;with family/visitor present   PT Visit Diagnosis: Other abnormalities of gait and mobility (R26.89);Difficulty in walking, not elsewhere classified (R26.2)     Time: 8955-8887 PT Time Calculation (min) (ACUTE ONLY): 28 min  Charges:    $Gait Training: 8-22 mins $Therapeutic Exercise: 8-22 mins PT General Charges $$ ACUTE PT VISIT: 1 Visit                     Deontrey Massi, PT  Acute Rehab Dept [facility]) [phone]  03/07/2023    [facility] 03/07/2023, 11:24 AM

## 2023-03-09 DIAGNOSIS — M25561 Pain in right knee: Secondary | ICD-10-CM | POA: Diagnosis not present

## 2023-03-09 DIAGNOSIS — M25661 Stiffness of right knee, not elsewhere classified: Secondary | ICD-10-CM | POA: Diagnosis not present

## 2023-03-13 DIAGNOSIS — M25561 Pain in right knee: Secondary | ICD-10-CM | POA: Diagnosis not present

## 2023-03-13 DIAGNOSIS — M25661 Stiffness of right knee, not elsewhere classified: Secondary | ICD-10-CM | POA: Diagnosis not present

## 2023-03-17 DIAGNOSIS — M25661 Stiffness of right knee, not elsewhere classified: Secondary | ICD-10-CM | POA: Diagnosis not present

## 2023-03-17 DIAGNOSIS — M25561 Pain in right knee: Secondary | ICD-10-CM | POA: Diagnosis not present

## 2023-03-20 DIAGNOSIS — M25661 Stiffness of right knee, not elsewhere classified: Secondary | ICD-10-CM | POA: Diagnosis not present

## 2023-03-20 DIAGNOSIS — M25561 Pain in right knee: Secondary | ICD-10-CM | POA: Diagnosis not present

## 2023-03-22 DIAGNOSIS — M25511 Pain in right shoulder: Secondary | ICD-10-CM | POA: Diagnosis not present

## 2023-03-22 DIAGNOSIS — Z5189 Encounter for other specified aftercare: Secondary | ICD-10-CM | POA: Diagnosis not present

## 2023-03-23 DIAGNOSIS — M19011 Primary osteoarthritis, right shoulder: Secondary | ICD-10-CM | POA: Diagnosis not present

## 2023-03-24 DIAGNOSIS — M25561 Pain in right knee: Secondary | ICD-10-CM | POA: Diagnosis not present

## 2023-03-24 DIAGNOSIS — M25661 Stiffness of right knee, not elsewhere classified: Secondary | ICD-10-CM | POA: Diagnosis not present

## 2023-03-28 DIAGNOSIS — M25661 Stiffness of right knee, not elsewhere classified: Secondary | ICD-10-CM | POA: Diagnosis not present

## 2023-03-28 DIAGNOSIS — M25561 Pain in right knee: Secondary | ICD-10-CM | POA: Diagnosis not present

## 2023-03-30 DIAGNOSIS — M25661 Stiffness of right knee, not elsewhere classified: Secondary | ICD-10-CM | POA: Diagnosis not present

## 2023-03-30 DIAGNOSIS — M25561 Pain in right knee: Secondary | ICD-10-CM | POA: Diagnosis not present

## 2023-04-04 DIAGNOSIS — M25561 Pain in right knee: Secondary | ICD-10-CM | POA: Diagnosis not present

## 2023-04-04 DIAGNOSIS — M25661 Stiffness of right knee, not elsewhere classified: Secondary | ICD-10-CM | POA: Diagnosis not present

## 2023-04-06 DIAGNOSIS — M25661 Stiffness of right knee, not elsewhere classified: Secondary | ICD-10-CM | POA: Diagnosis not present

## 2023-04-06 DIAGNOSIS — M25561 Pain in right knee: Secondary | ICD-10-CM | POA: Diagnosis not present

## 2023-04-11 DIAGNOSIS — M25561 Pain in right knee: Secondary | ICD-10-CM | POA: Diagnosis not present

## 2023-04-11 DIAGNOSIS — Z5189 Encounter for other specified aftercare: Secondary | ICD-10-CM | POA: Diagnosis not present

## 2023-04-11 DIAGNOSIS — M25661 Stiffness of right knee, not elsewhere classified: Secondary | ICD-10-CM | POA: Diagnosis not present

## 2023-04-18 DIAGNOSIS — M25561 Pain in right knee: Secondary | ICD-10-CM | POA: Diagnosis not present

## 2023-04-18 DIAGNOSIS — M25661 Stiffness of right knee, not elsewhere classified: Secondary | ICD-10-CM | POA: Diagnosis not present

## 2023-04-20 DIAGNOSIS — M25561 Pain in right knee: Secondary | ICD-10-CM | POA: Diagnosis not present

## 2023-04-20 DIAGNOSIS — M25661 Stiffness of right knee, not elsewhere classified: Secondary | ICD-10-CM | POA: Diagnosis not present

## 2023-04-25 DIAGNOSIS — M25661 Stiffness of right knee, not elsewhere classified: Secondary | ICD-10-CM | POA: Diagnosis not present

## 2023-04-25 DIAGNOSIS — M25561 Pain in right knee: Secondary | ICD-10-CM | POA: Diagnosis not present

## 2023-04-27 DIAGNOSIS — M25561 Pain in right knee: Secondary | ICD-10-CM | POA: Diagnosis not present

## 2023-04-27 DIAGNOSIS — M25661 Stiffness of right knee, not elsewhere classified: Secondary | ICD-10-CM | POA: Diagnosis not present

## 2023-05-01 ENCOUNTER — Ambulatory Visit

## 2023-05-01 ENCOUNTER — Ambulatory Visit (INDEPENDENT_AMBULATORY_CARE_PROVIDER_SITE_OTHER): Payer: Medicare Other

## 2023-05-01 ENCOUNTER — Ambulatory Visit (INDEPENDENT_AMBULATORY_CARE_PROVIDER_SITE_OTHER): Admitting: Family Medicine

## 2023-05-01 ENCOUNTER — Encounter: Payer: Self-pay | Admitting: Family Medicine

## 2023-05-01 VITALS — Ht 71.0 in | Wt 190.0 lb

## 2023-05-01 VITALS — BP 120/80 | HR 91 | Temp 98.5°F | Resp 16 | Ht 71.0 in | Wt 190.1 lb

## 2023-05-01 DIAGNOSIS — Z0000 Encounter for general adult medical examination without abnormal findings: Secondary | ICD-10-CM

## 2023-05-01 DIAGNOSIS — I771 Stricture of artery: Secondary | ICD-10-CM | POA: Diagnosis not present

## 2023-05-01 DIAGNOSIS — R053 Chronic cough: Secondary | ICD-10-CM | POA: Diagnosis not present

## 2023-05-01 DIAGNOSIS — R059 Cough, unspecified: Secondary | ICD-10-CM | POA: Diagnosis not present

## 2023-05-01 DIAGNOSIS — J310 Chronic rhinitis: Secondary | ICD-10-CM

## 2023-05-01 LAB — CBC WITH DIFFERENTIAL/PLATELET
Basophils Absolute: 0 10*3/uL (ref 0.0–0.1)
Basophils Relative: 0.4 % (ref 0.0–3.0)
Eosinophils Absolute: 0 10*3/uL (ref 0.0–0.7)
Eosinophils Relative: 1 % (ref 0.0–5.0)
HCT: 36.8 % — ABNORMAL LOW (ref 39.0–52.0)
Hemoglobin: 12.1 g/dL — ABNORMAL LOW (ref 13.0–17.0)
Lymphocytes Relative: 22.9 % (ref 12.0–46.0)
Lymphs Abs: 0.8 10*3/uL (ref 0.7–4.0)
MCHC: 32.7 g/dL (ref 30.0–36.0)
MCV: 88 fl (ref 78.0–100.0)
Monocytes Absolute: 0.5 10*3/uL (ref 0.1–1.0)
Monocytes Relative: 14.9 % — ABNORMAL HIGH (ref 3.0–12.0)
Neutro Abs: 2.2 10*3/uL (ref 1.4–7.7)
Neutrophils Relative %: 60.8 % (ref 43.0–77.0)
Platelets: 260 10*3/uL (ref 150.0–400.0)
RBC: 4.18 Mil/uL — ABNORMAL LOW (ref 4.22–5.81)
RDW: 16.1 % — ABNORMAL HIGH (ref 11.5–15.5)
WBC: 3.6 10*3/uL — ABNORMAL LOW (ref 4.0–10.5)

## 2023-05-01 MED ORDER — BENZONATATE 100 MG PO CAPS: 100.0000 mg | ORAL_CAPSULE | Freq: Two times a day (BID) | ORAL | 0 refills | Status: AC | PRN

## 2023-05-01 MED ORDER — FLUTICASONE PROPIONATE 50 MCG/ACT NA SUSP: 1.0000 | Freq: Two times a day (BID) | NASAL | 3 refills | Status: AC

## 2023-05-01 NOTE — Progress Notes (Signed)
 ACUTE VISIT Chief Complaint  Patient presents with   Cough    Ongoing since his surgery, has been sleeping in a room in the house that doesn't have heat during recovery.    HPI: Mr.Kaleb KEISUKE HOLLABAUGH is a 88 y.o. male with a PMHx significant for DM II, diabetic neuropathy, OA, and testosterone deficiency, among some, who is here today complaining of cough.   Cough:  Patient complains of cough since he had a total right knee arthroplasty on 03/06/2023.  Also endorses nasal congestion.  Cough This is a new problem. The current episode started more than 1 month ago. The problem has been waxing and waning. The cough is Non-productive. Associated symptoms include postnasal drip and rhinorrhea. Pertinent negatives include no chest pain, ear congestion, ear pain, heartburn, hemoptysis, myalgias, rash, sore throat or sweats. Nothing aggravates the symptoms. He has tried nothing for the symptoms. There is no history of asthma or COPD.   He mentions he has been sleeping in a cool room that was added on to his house because there is a recliner in that room, no heater. He is having some difficulty sleeping but he attributes that to his knee pain rather than the cough. He wakes up frequently throughout the night.   He was sent home from physical therapy and recommended to be seen because of his cough last week.  He says the cough changes in severity depending on the day.  No smoking hx.  He has not taken anything OTC for the cough. He has not used a nasal spray for congestion.   Pertinent negatives include fever, chills, abnormal weight loss,wheezing, SOB, heartburn, ear ache, headache,  or known sick contacts.  Negative for erythema, orthopnea, or PND.  Lab Results  Component Value Date   WBC 14.5 (H) 03/07/2023   HGB 11.0 (L) 03/07/2023   HCT 33.3 (L) 03/07/2023   MCV 91.5 03/07/2023   PLT 259 03/07/2023   Review of Systems  Constitutional:  Positive for fatigue.  HENT:  Positive for  congestion, postnasal drip and rhinorrhea. Negative for ear pain, mouth sores and sore throat.   Respiratory:  Positive for cough. Negative for hemoptysis.   Cardiovascular:  Negative for chest pain.  Gastrointestinal:  Negative for abdominal pain, heartburn, nausea and vomiting.  Genitourinary:  Negative for decreased urine volume, dysuria and hematuria.  Musculoskeletal:  Negative for myalgias.  Skin:  Negative for rash.  Neurological:  Negative for syncope and facial asymmetry.  Psychiatric/Behavioral:  Negative for confusion and hallucinations.   See other pertinent positives and negatives in HPI.  Current Outpatient Medications on File Prior to Visit  Medication Sig Dispense Refill   cyanocobalamin (VITAMIN B12) 1000 MCG tablet Take 1,000 mcg by mouth daily.     metFORMIN (GLUCOPHAGE-XR) 500 MG 24 hr tablet TAKE ONE TABLET DAILY WITH BREAKFAST 180 tablet 1   methocarbamol (ROBAXIN) 500 MG tablet Take 1 tablet (500 mg total) by mouth every 6 (six) hours as needed for muscle spasms. 40 tablet 0   ondansetron (ZOFRAN) 4 MG tablet Take 1 tablet (4 mg total) by mouth every 6 (six) hours as needed for nausea. 20 tablet 0   oxyCODONE (OXY IR/ROXICODONE) 5 MG immediate release tablet Take 1-2 tablets (5-10 mg total) by mouth every 6 (six) hours as needed for severe pain (pain score 7-10). 42 tablet 0   traMADol (ULTRAM) 50 MG tablet Take 1-2 tablets (50-100 mg total) by mouth every 6 (six) hours as needed for moderate  pain (pain score 4-6). 40 tablet 0   Zinc 50 MG TABS Take 50 mg by mouth daily.     No current facility-administered medications on file prior to visit.   Past Medical History:  Diagnosis Date   Arthritis    "hands and knees" (06/10/2015)   Basal cell carcinoma, arm    "both"   Basal cell carcinoma, face    Chest pain    Diabetes mellitus type II 10/23/2007   Intention tremor    Migraine    "none in years" (06/10/2015)   Osteoarthritis    Paresthesia of foot    bilateral    Stroke (HCC) 02/22/2003   mini stroke   No Known Allergies  Social History   Socioeconomic History   Marital status: Widowed    Spouse name: Not on file   Number of children: 2   Years of education: Not on file   Highest education level: Not on file  Occupational History   Occupation: Retired-Contractor  Tobacco Use   Smoking status: Never   Smokeless tobacco: Never   Tobacco comments:    "might have smoked 1 pack of cigarettes in my life"  Vaping Use   Vaping status: Never Used  Substance and Sexual Activity   Alcohol use: No   Drug use: No   Sexual activity: Not Currently  Other Topics Concern   Not on file  Social History Narrative   Retired Product/process development scientist    Social Drivers of Health   Financial Resource Strain: Low Risk  (04/20/2022)   Overall Financial Resource Strain (CARDIA)    Difficulty of Paying Living Expenses: Not hard at all  Food Insecurity: No Food Insecurity (03/06/2023)   Hunger Vital Sign    Worried About Running Out of Food in the Last Year: Never true    Ran Out of Food in the Last Year: Never true  Transportation Needs: No Transportation Needs (03/06/2023)   PRAPARE - Administrator, Civil Service (Medical): No    Lack of Transportation (Non-Medical): No  Physical Activity: Sufficiently Active (04/20/2022)   Exercise Vital Sign    Days of Exercise per Week: 5 days    Minutes of Exercise per Session: 40 min  Stress: No Stress Concern Present (04/20/2022)   [employer] of Occupational Health - Occupational Stress Questionnaire    Feeling of Stress : Not at all  Social Connections: Socially Isolated (03/06/2023)   Social Connection and Isolation Panel [NHANES]    Frequency of Communication with Friends and Family: More than three times a week    Frequency of Social Gatherings with Friends and Family: More than three times a week    Attends Religious Services: Never    Database administrator or Organizations: No    Attends  Banker Meetings: Never    Marital Status: Widowed    Vitals:   05/01/23 0858  BP: 120/80  Pulse: 91  Resp: 16  Temp: 98.5 F (36.9 C)  SpO2: 98%   Body mass index is 26.52 kg/m.  Physical Exam Vitals and nursing note reviewed.  Constitutional:      General: He is not in acute distress.    Appearance: He is well-developed.  HENT:     Head: Normocephalic and atraumatic.     Nose: Congestion and rhinorrhea present.     Right Turbinates: Enlarged.     Left Turbinates: Enlarged.     Mouth/Throat:     Mouth: Mucous membranes are  moist.     Pharynx: Oropharynx is clear. Uvula midline.  Eyes:     Conjunctiva/sclera: Conjunctivae normal.  Cardiovascular:     Rate and Rhythm: Normal rate and regular rhythm.     Heart sounds: No murmur heard. Pulmonary:     Effort: Pulmonary effort is normal. No respiratory distress.     Breath sounds: Normal breath sounds.  Lymphadenopathy:     Cervical: No cervical adenopathy.  Skin:    General: Skin is warm.     Findings: No erythema or rash.  Neurological:     Mental Status: He is alert and oriented to person, place, and time.     Comments: Antalgic gait assisted with a walker.  Psychiatric:        Mood and Affect: Mood and affect normal.   ASSESSMENT AND PLAN:  Mr. Larusso was seen today for congestion and cough.   Lab Results  Component Value Date   WBC 3.6 (L) 05/01/2023   HGB 12.1 (L) 05/01/2023   HCT 36.8 (L) 05/01/2023   MCV 88.0 05/01/2023   PLT 260.0 05/01/2023   Persistent cough for 3 weeks or longer We discussed possible etiologies. History and examination today do not suggest a serious process. Son to consider postviral, seasonal allergies, or GERD and wants home. For now recommend benzonatate for symptomatic management. Because cough has been persistent, chest x-ray was ordered today. CXR: I do not appreciate opacities or infiltrates, pending radiology report. Follow-up with PCP in 3 weeks if cough  is not elevated.  -     Benzonatate; Take 1 capsule (100 mg total) by mouth 2 (two) times daily as needed for up to 10 days.  Dispense: 20 capsule; Refill: 0 -     DG Chest 2 View; Future  Rhinitis, unspecified type ?  Analgesic. Recommend Flonase nasal spray daily at night for 10 to 14 days then as needed. Nasal saline irrigations as needed throughout the day. Do not recommend antihistamines for now.  -     Fluticasone Propionate; Place 1 spray into both nostrils 2 (two) times daily.  Dispense: 16 g; Refill: 3  Return in about 3 weeks (around 05/22/2023), or if symptoms worsen or fail to improve.  I, Rolla Etienne Wierda, acting as a scribe for Danali Marinos [place], MD., have documented all relevant documentation on the behalf of Jaaron Oleson [place], MD, as directed by  Babyboy Loya [place], MD while in the presence of Clover Feehan [place], MD.   I, Blayre Papania [place], MD, have reviewed all documentation for this visit. The documentation on 05/01/23 for the exam, diagnosis, procedures, and orders are all accurate and complete.  Adalai Perl G. [place], MD  [facility] [place] office.

## 2023-05-01 NOTE — Patient Instructions (Addendum)
 A few things to remember from today's visit:  Persistent cough for 3 weeks or longer - Plan: benzonatate (TESSALON) 100 MG capsule, DG Chest 2 View  Rhinitis, unspecified type - Plan: fluticasone (FLONASE) 50 MCG/ACT nasal spray  Monitor for new symptoms. Flonase nasal spray at night for 10-14 days then as needed. Nasal saline irrigations during the day as needed. Follow up with PCP in 2-3 weeks if problem is not resolved.  Do not use My Chart to request refills or for acute issues that need immediate attention. If you send a my chart message, it may take a few days to be addressed, specially if I am not in the office.  Please be sure medication list is accurate. If a new problem present, please set up appointment sooner than planned today.

## 2023-05-01 NOTE — Progress Notes (Signed)
 Subjective:   Tyler Greer is a 88 y.o. male who presents for Medicare Annual/Subsequent preventive examination.  Visit Complete: Virtual I connected with  Tyler Greer on 05/01/23 by a audio enabled telemedicine application and verified that I am speaking with the correct person using two identifiers.  Patient Location: Home  Provider Location: Home Office  I discussed the limitations of evaluation and management by telemedicine. The patient expressed understanding and agreed to proceed.  Vital Signs: Because this visit was a virtual/telehealth visit, some criteria may be missing or patient reported. Any vitals not documented were not able to be obtained and vitals that have been documented are patient reported.   Cardiac Risk Factors include: advanced age (>14men, >78 women);diabetes mellitus;dyslipidemia;male gender     Objective:    Today's Vitals   05/01/23 1142  Weight: 190 lb (86.2 kg)  Height: 5\' 11"  (1.803 m)   Body mass index is 26.5 kg/m.     05/01/2023   11:51 AM 03/06/2023   11:55 AM 02/23/2023    8:09 AM 04/20/2022   10:32 AM 04/19/2021    8:20 AM 04/16/2020    9:56 AM 09/09/2016    6:38 PM  Advanced Directives  Does Patient Have a Medical Advance Directive? No No No No No No No  Would patient like information on creating a medical advance directive? No - Patient declined No - Patient declined No - Patient declined No - Patient declined No - Patient declined No - Patient declined     Current Medications (verified) Outpatient Encounter Medications as of 05/01/2023  Medication Sig   cyanocobalamin (VITAMIN B12) 1000 MCG tablet Take 1,000 mcg by mouth daily.   metFORMIN (GLUCOPHAGE-XR) 500 MG 24 hr tablet TAKE ONE TABLET DAILY WITH BREAKFAST   methocarbamol (ROBAXIN) 500 MG tablet Take 1 tablet (500 mg total) by mouth every 6 (six) hours as needed for muscle spasms.   ondansetron (ZOFRAN) 4 MG tablet Take 1 tablet (4 mg total) by mouth every 6 (six) hours  as needed for nausea.   oxyCODONE (OXY IR/ROXICODONE) 5 MG immediate release tablet Take 1-2 tablets (5-10 mg total) by mouth every 6 (six) hours as needed for severe pain (pain score 7-10).   traMADol (ULTRAM) 50 MG tablet Take 1-2 tablets (50-100 mg total) by mouth every 6 (six) hours as needed for moderate pain (pain score 4-6).   Zinc 50 MG TABS Take 50 mg by mouth daily.   No facility-administered encounter medications on file as of 05/01/2023.    Allergies (verified) Patient has no known allergies.   History: Past Medical History:  Diagnosis Date   Arthritis    "hands and knees" (06/10/2015)   Basal cell carcinoma, arm    "both"   Basal cell carcinoma, face    Chest pain    Diabetes mellitus type II 10/23/2007   Intention tremor    Migraine    "none in years" (06/10/2015)   Osteoarthritis    Paresthesia of foot    bilateral   Stroke (HCC) 02/22/2003   mini stroke   Past Surgical History:  Procedure Laterality Date   HAMMER TOE SURGERY Bilateral    JOINT REPLACEMENT     KNEE ARTHROPLASTY Left 06/10/2015   Procedure: COMPUTER ASSISTED TOTAL KNEE ARTHROPLASTY;  Surgeon: Eldred Manges, MD;  Location: [facility] OR;  Service: Orthopedics;  Laterality: Left;   KNEE ARTHROSCOPY Bilateral 02/22/2000   LUMBAR LAMINECTOMY  X 2   TONSILLECTOMY     TOTAL  KNEE ARTHROPLASTY Right 03/06/2023   Procedure: TOTAL KNEE ARTHROPLASTY;  Surgeon: Ollen Gross, MD;  Location: [facility];  Service: Orthopedics;  Laterality: Right;   Family History  Problem Relation Age of Onset   Heart disease Father        died age 86-MI   Diabetes type II Brother    Melanoma Brother    Social History   Socioeconomic History   Marital status: Widowed    Spouse name: Not on file   Number of children: 2   Years of education: Not on file   Highest education level: Not on file  Occupational History   Occupation: Retired-Contractor  Tobacco Use   Smoking status: Never   Smokeless tobacco: Never   Tobacco  comments:    "might have smoked 1 pack of cigarettes in my life"  Vaping Use   Vaping status: Never Used  Substance and Sexual Activity   Alcohol use: No   Drug use: No   Sexual activity: Not Currently  Other Topics Concern   Not on file  Social History Narrative   Retired Product/process development scientist    Social Drivers of Health   Financial Resource Strain: Low Risk  (05/01/2023)   Overall Financial Resource Strain (CARDIA)    Difficulty of Paying Living Expenses: Not hard at all  Food Insecurity: No Food Insecurity (05/01/2023)   Hunger Vital Sign    Worried About Running Out of Food in the Last Year: Never true    Ran Out of Food in the Last Year: Never true  Transportation Needs: No Transportation Needs (05/01/2023)   PRAPARE - Administrator, Civil Service (Medical): No    Lack of Transportation (Non-Medical): No  Physical Activity: Insufficiently Active (05/01/2023)   Exercise Vital Sign    Days of Exercise per Week: 2 days    Minutes of Exercise per Session: 60 min  Stress: No Stress Concern Present (05/01/2023)   [employer] of Occupational Health - Occupational Stress Questionnaire    Feeling of Stress : Not at all  Social Connections: Moderately Integrated (05/01/2023)   Social Connection and Isolation Panel [NHANES]    Frequency of Communication with Friends and Family: More than three times a week    Frequency of Social Gatherings with Friends and Family: More than three times a week    Attends Religious Services: More than 4 times per year    Active Member of [employer] or Organizations: Yes    Attends Banker Meetings: More than 4 times per year    Marital Status: Widowed  Recent Concern: Social Connections - Socially Isolated (03/06/2023)   Social Connection and Isolation Panel [NHANES]    Frequency of Communication with Friends and Family: More than three times a week    Frequency of Social Gatherings with Friends and Family: More than three times a  week    Attends Religious Services: Never    Database administrator or Organizations: No    Attends Banker Meetings: Never    Marital Status: Widowed    Tobacco Counseling Counseling given: Not Answered Tobacco comments: "might have smoked 1 pack of cigarettes in my life"   Clinical Intake:  Pre-visit preparation completed: Yes  Pain : No/denies pain     BMI - recorded: 26.5 Nutritional Status: BMI 25 -29 Overweight Nutritional Risks: None Diabetes: Yes CBG done?: No Did pt. bring in CBG monitor from home?: No  How often do you need to have someone  help you when you read instructions, pamphlets, or other written materials from your doctor or pharmacy?: 1 - Never  Interpreter Needed?: No  Information entered by :: Theresa Mulligan LPN   Activities of Daily Living    05/01/2023   11:50 AM 03/06/2023   11:55 AM  In your present state of health, do you have any difficulty performing the following activities:  Hearing? 0 0  Vision? 0 0  Difficulty concentrating or making decisions? 0 0  Walking or climbing stairs? 0   Dressing or bathing? 0   Doing errands, shopping? 0 0  Preparing Food and eating ? N   Using the Toilet? N   In the past six months, have you accidently leaked urine? N   Do you have problems with loss of bowel control? N   Managing your Medications? N   Managing your Finances? N   Housekeeping or managing your Housekeeping? N     Patient Care Team: Shirline Frees, NP as PCP - General (Family Medicine) Kathleene Hazel, MD as PCP - Cardiology (Cardiology)  Indicate any recent Medical Services you may have received from other than [facility] providers in the past year (date may be approximate).     Assessment:   This is a routine wellness examination for [place].  Hearing/Vision screen Hearing Screening - Comments:: Denies hearing difficulties   Vision Screening - Comments:: Wears rx glasses - up to date with routine eye exams with   Dr Elmer Picker   Goals Addressed               This Visit's Progress     Increase physical activity (pt-stated)        Remain Active.       Depression Screen    05/01/2023   11:48 AM 06/06/2022    2:05 PM 04/20/2022   10:29 AM 04/19/2021    8:17 AM 01/26/2021    7:16 AM 04/16/2020    9:59 AM 08/08/2019    9:31 AM  PHQ 2/9 Scores  PHQ - 2 Score 0 3 0 2 3 0 0  PHQ- 9 Score  4  4 8       Fall Risk    05/01/2023   11:50 AM 06/06/2022    2:05 PM 04/20/2022   10:31 AM 04/19/2021    8:21 AM 01/26/2021    7:17 AM  Fall Risk   Falls in the past year? 0 0 0 0 0  Number falls in past yr: 0 0 0 0   Injury with Fall? 0 0 0 0   Risk for fall due to : No Fall Risks No Fall Risks No Fall Risks No Fall Risks   Follow up Falls prevention discussed;Falls evaluation completed Falls evaluation completed Falls prevention discussed Falls evaluation completed     MEDICARE RISK AT HOME: Medicare Risk at Home Any stairs in or around the home?: No If so, are there any without handrails?: No Home free of loose throw rugs in walkways, pet beds, electrical cords, etc?: Yes Adequate lighting in your home to reduce risk of falls?: Yes Life alert?: No Use of a cane, walker or w/c?: Yes Grab bars in the bathroom?: Yes Shower chair or bench in shower?: Yes Elevated toilet seat or a handicapped toilet?: Yes  TIMED UP AND GO:  Was the test performed?  No    Cognitive Function:        05/01/2023   11:51 AM 04/20/2022   10:32 AM  6CIT Screen  What Year? 0 points 0 points  What month? 0 points 0 points  What time? 0 points 0 points  Count back from 20 0 points 0 points  Months in reverse -- 0 points  Repeat phrase 6 points 0 points  Total Score  0 points    Immunizations Immunization History  Administered Date(s) Administered   Fluad Quad(high Dose 65+) 12/29/2018, 01/07/2020, 12/25/2020, 12/27/2021   Fluad Trivalent(High Dose 65+) 12/15/2022   Influenza Split 11/26/2010, 11/30/2011    Influenza Whole 12/05/2007, 11/19/2008, 11/10/2009   Influenza, High Dose Seasonal PF 12/01/2014, 12/11/2015, 12/15/2016, 12/20/2017   Influenza,inj,Quad PF,6+ Mos 11/28/2012, 11/20/2013   Influenza,inj,quad, With Preservative 11/10/2016   PFIZER(Purple Top)SARS-COV-2 Vaccination 03/12/2019, 04/02/2019, 11/22/2019   Pneumococcal Conjugate-13 02/24/2014   Pneumococcal Polysaccharide-23 11/26/2010   Tdap 11/26/2010   Zoster Recombinant(Shingrix) 04/04/2020    TDAP status: Due, Education has been provided regarding the importance of this vaccine. Advised may receive this vaccine at local pharmacy or Health Dept. Aware to provide a copy of the vaccination record if obtained from local pharmacy or Health Dept. Verbalized acceptance and understanding.  Flu Vaccine status: Up to date  Pneumococcal vaccine status: Up to date  Covid-19 vaccine status: Declined, Education has been provided regarding the importance of this vaccine but patient still declined. Advised may receive this vaccine at local pharmacy or Health Dept.or vaccine clinic. Aware to provide a copy of the vaccination record if obtained from local pharmacy or Health Dept. Verbalized acceptance and understanding.    Screening Tests Health Maintenance  Topic Date Due   FOOT EXAM  08/07/2020   DTaP/Tdap/Td (2 - Td or Tdap) 11/25/2020   COVID-19 Vaccine (4 - 2024-25 season) 10/23/2022   Diabetic kidney evaluation - Urine ACR  02/09/2023   HEMOGLOBIN A1C  08/23/2023   OPHTHALMOLOGY EXAM  12/23/2023   Diabetic kidney evaluation - eGFR measurement  03/06/2024   Medicare Annual Wellness (AWV)  04/30/2024   Pneumonia Vaccine 20+ Years old  Completed   INFLUENZA VACCINE  Completed   HPV VACCINES  Aged Out   Zoster Vaccines- Shingrix  Discontinued    Health Maintenance  Health Maintenance Due  Topic Date Due   FOOT EXAM  08/07/2020   DTaP/Tdap/Td (2 - Td or Tdap) 11/25/2020   COVID-19 Vaccine (4 - 2024-25 season) 10/23/2022    Diabetic kidney evaluation - Urine ACR  02/09/2023        Additional Screening:    Vision Screening: Recommended annual ophthalmology exams for early detection of glaucoma and other disorders of the eye. Is the patient up to date with their annual eye exam?  Yes  Who is the provider or what is the name of the office in which the patient attends annual eye exams? Dr Elmer Picker If pt is not established with a provider, would they like to be referred to a provider to establish care? No .   Dental Screening: Recommended annual dental exams for proper oral hygiene  Diabetic Foot Exam: Diabetic Foot Exam: Overdue, Pt has been advised about the importance in completing this exam. Pt is scheduled for diabetic foot exam on Deferred.  Community Resource Referral / Chronic Care Management:  CRR required this visit?  No   CCM required this visit?  No     Plan:     I have personally reviewed and noted the following in the patient's chart:   Medical and social history Use of alcohol, tobacco or illicit drugs  Current medications and supplements including opioid prescriptions.  Patient is currently taking opioid prescriptions. Information provided to patient regarding non-opioid alternatives. Patient advised to discuss non-opioid treatment plan with their provider. Functional ability and status Nutritional status Physical activity Advanced directives List of other physicians Hospitalizations, surgeries, and ER visits in previous 12 months Vitals Screenings to include cognitive, depression, and falls Referrals and appointments  In addition, I have reviewed and discussed with patient certain preventive protocols, quality metrics, and best practice recommendations. A written personalized care plan for preventive services as well as general preventive health recommendations were provided to patient.     Tillie Rung, LPN   10/01/9145   After Visit Summary: (MyChart) Due to this being a  telephonic visit, the after visit summary with patients personalized plan was offered to patient via MyChart   Nurse Notes: None

## 2023-05-01 NOTE — Patient Instructions (Addendum)
 [name] , Thank you for taking time to come for your Medicare Wellness Visit. I appreciate your ongoing commitment to your health goals. Please review the following plan we discussed and let me know if I can assist you in the future.   Referrals/Orders/Follow-Ups/Clinician Recommendations: Remain active and follow up with labs results.  This is a list of the screening recommended for you and due dates:  Health Maintenance  Topic Date Due   Complete foot exam   08/07/2020   DTaP/Tdap/Td vaccine (2 - Td or Tdap) 11/25/2020   COVID-19 Vaccine (4 - 2024-25 season) 10/23/2022   Yearly kidney health urinalysis for diabetes  02/09/2023   Hemoglobin A1C  08/23/2023   Eye exam for diabetics  12/23/2023   Yearly kidney function blood test for diabetes  03/06/2024   Medicare Annual Wellness Visit  04/30/2024   Pneumonia Vaccine  Completed   Flu Shot  Completed   HPV Vaccine  Aged Out   Zoster (Shingles) Vaccine  Discontinued   Opioid Pain Medicine Management Opioids are powerful medicines that are used to treat moderate to severe pain. When used for short periods of time, they can help you to: Sleep better. Do better in physical or occupational therapy. Feel better in the first few days after an injury. Recover from surgery. Opioids should be taken with the supervision of a trained health care provider. They should be taken for the shortest period of time possible. This is because opioids can be addictive, and the longer you take opioids, the greater your risk of addiction. This addiction can also be called opioid use disorder. What are the risks? Using opioid pain medicines for longer than 3 days increases your risk of side effects. Side effects include: Constipation. Nausea and vomiting. Breathing difficulties (respiratory depression). Drowsiness. Confusion. Opioid use disorder. Itching. Taking opioid pain medicine for a long period of time can affect your ability to do daily tasks.  It also puts you at risk for: Motor vehicle crashes. Depression. Suicide. Heart attack. Overdose, which can be life-threatening. What is a pain treatment plan? A pain treatment plan is an agreement between you and your health care provider. Pain is unique to each person, and treatments vary depending on your condition. To manage your pain, you and your health care provider need to work together. To help you do this: Discuss the goals of your treatment, including how much pain you might expect to have and how you will manage the pain. Review the risks and benefits of taking opioid medicines. Remember that a good treatment plan uses more than one approach and minimizes the chance of side effects. Be honest about the amount of medicines you take and about any drug or alcohol use. Get pain medicine prescriptions from only one health care provider. Pain can be managed with many types of alternative treatments. Ask your health care provider to refer you to one or more specialists who can help you manage pain through: Physical or occupational therapy. Counseling (cognitive behavioral therapy). Good nutrition. Biofeedback. Massage. Meditation. Non-opioid medicine. Following a gentle exercise program. How to use opioid pain medicine Taking medicine Take your pain medicine exactly as told by your health care provider. Take it only when you need it. If your pain gets less severe, you may take less than your prescribed dose if your health care provider approves. If you are not having pain, do nottake pain medicine unless your health care provider tells you to take it. If your pain is severe,  do nottry to treat it yourself by taking more pills than instructed on your prescription. Contact your health care provider for help. Write down the times when you take your pain medicine. It is easy to become confused while on pain medicine. Writing the time can help you avoid overdose. Take other  over-the-counter or prescription medicines only as told by your health care provider. Keeping yourself and others safe  While you are taking opioid pain medicine: Do not drive, use machinery, or power tools. Do not sign legal documents. Do not drink alcohol. Do not take sleeping pills. Do not supervise children by yourself. Do not do activities that require climbing or being in high places. Do not go to a lake, river, ocean, spa, or swimming pool. Do not share your pain medicine with anyone. Keep pain medicine in a locked cabinet or in a secure area where pets and children cannot reach it. Stopping your use of opioids If you have been taking opioid medicine for more than a few weeks, you may need to slowly decrease (taper) how much you take until you stop completely. Tapering your use of opioids can decrease your risk of symptoms of withdrawal, such as: Pain and cramping in the abdomen. Nausea. Sweating. Sleepiness. Restlessness. Uncontrollable shaking (tremors). Cravings for the medicine. Do not attempt to taper your use of opioids on your own. Talk with your health care provider about how to do this. Your health care provider may prescribe a step-down schedule based on how much medicine you are taking and how long you have been taking it. Getting rid of leftover pills Do not save any leftover pills. Get rid of leftover pills safely by: Taking the medicine to a prescription take-back program. This is usually offered by the county or law enforcement. Bringing them to a pharmacy that has a drug disposal container. Flushing them down the toilet. Check the label or package insert of your medicine to see whether this is safe to do. Throwing them out in the trash. Check the label or package insert of your medicine to see whether this is safe to do. If it is safe to throw it out, remove the medicine from the original container, put it into a sealable bag or container, and mix it with used  coffee grounds, food scraps, dirt, or cat litter before putting it in the trash. Follow these instructions at home: Activity Do exercises as told by your health care provider. Avoid activities that make your pain worse. Return to your normal activities as told by your health care provider. Ask your health care provider what activities are safe for you. General instructions You may need to take these actions to prevent or treat constipation: Drink enough fluid to keep your urine pale yellow. Take over-the-counter or prescription medicines. Eat foods that are high in fiber, such as beans, whole grains, and fresh fruits and vegetables. Limit foods that are high in fat and processed sugars, such as fried or sweet foods. Keep all follow-up visits. This is important. Where to find support If you have been taking opioids for a long time, you may benefit from receiving support for quitting from a local support group or counselor. Ask your health care provider for a referral to these resources in your area. Where to find more information Centers for Disease Control and Prevention (CDC): FootballExhibition.com.br [place] Food and Drug Administration (FDA): PumpkinSearch.com.ee Get help right away if: You may have taken too much of an opioid (overdosed). Common symptoms of an  overdose: Your breathing is slower or more shallow than normal. You have a very slow heartbeat (pulse). You have slurred speech. You have nausea and vomiting. Your pupils become very small. You have other potential symptoms: You are very confused. You faint or feel like you will faint. You have cold, clammy skin. You have blue lips or fingernails. You have thoughts of harming yourself or harming others. These symptoms may represent a serious problem that is an emergency. Do not wait to see if the symptoms will go away. Get medical help right away. Call your local emergency services (911 in the [place] Do not drive yourself to the hospital.  If you  ever feel like you may hurt yourself or others, or have thoughts about taking your own life, get help right away. Go to your nearest emergency department or: Call your local emergency services (911 in the [place] Call the [facility] (949-374-0943 in the [place] Call a suicide crisis helpline, such as the National Suicide Prevention Lifeline at (412) 483-0060 or 988 in the [place] This is open 24 hours a day in the [place] If you're a Veteran: Call 988 and press 1. This is open 24 hours a day. Text the [employer] at (712)703-9532. Summary Opioid medicines can help you manage moderate to severe pain for a short period of time. A pain treatment plan is an agreement between you and your health care provider. Discuss the goals of your treatment, including how much pain you might expect to have and how you will manage the pain. If you think that you or someone else may have taken too much of an opioid, get medical help right away. This information is not intended to replace advice given to you by your health care provider. Make sure you discuss any questions you have with your health care provider. Document Revised: 11/14/2022 Document Reviewed: 05/20/2020 Elsevier Patient Education  2024 [employer] Advanced directives: (Declined) Advance directive discussed with you today. Even though you declined this today, please call our office should you change your mind, and we can give you the proper paperwork for you to fill out.  Next Medicare Annual Wellness Visit scheduled for next year: Yes

## 2023-05-02 DIAGNOSIS — M25661 Stiffness of right knee, not elsewhere classified: Secondary | ICD-10-CM | POA: Diagnosis not present

## 2023-05-02 DIAGNOSIS — M25561 Pain in right knee: Secondary | ICD-10-CM | POA: Diagnosis not present

## 2023-05-04 DIAGNOSIS — M25561 Pain in right knee: Secondary | ICD-10-CM | POA: Diagnosis not present

## 2023-05-04 DIAGNOSIS — M25661 Stiffness of right knee, not elsewhere classified: Secondary | ICD-10-CM | POA: Diagnosis not present

## 2023-05-09 DIAGNOSIS — M25661 Stiffness of right knee, not elsewhere classified: Secondary | ICD-10-CM | POA: Diagnosis not present

## 2023-05-09 DIAGNOSIS — M25561 Pain in right knee: Secondary | ICD-10-CM | POA: Diagnosis not present

## 2023-05-11 DIAGNOSIS — M25661 Stiffness of right knee, not elsewhere classified: Secondary | ICD-10-CM | POA: Diagnosis not present

## 2023-05-11 DIAGNOSIS — M25561 Pain in right knee: Secondary | ICD-10-CM | POA: Diagnosis not present

## 2023-06-27 DIAGNOSIS — L821 Other seborrheic keratosis: Secondary | ICD-10-CM | POA: Diagnosis not present

## 2023-06-27 DIAGNOSIS — C44219 Basal cell carcinoma of skin of left ear and external auricular canal: Secondary | ICD-10-CM | POA: Diagnosis not present

## 2023-06-27 DIAGNOSIS — Z85828 Personal history of other malignant neoplasm of skin: Secondary | ICD-10-CM | POA: Diagnosis not present

## 2023-06-27 DIAGNOSIS — L570 Actinic keratosis: Secondary | ICD-10-CM | POA: Diagnosis not present

## 2023-06-27 DIAGNOSIS — D485 Neoplasm of uncertain behavior of skin: Secondary | ICD-10-CM | POA: Diagnosis not present

## 2023-07-11 ENCOUNTER — Ambulatory Visit (INDEPENDENT_AMBULATORY_CARE_PROVIDER_SITE_OTHER): Admitting: Adult Health

## 2023-07-11 VITALS — BP 120/62 | HR 86 | Temp 97.9°F

## 2023-07-11 DIAGNOSIS — M79604 Pain in right leg: Secondary | ICD-10-CM

## 2023-07-11 DIAGNOSIS — Z7984 Long term (current) use of oral hypoglycemic drugs: Secondary | ICD-10-CM

## 2023-07-11 DIAGNOSIS — E0840 Diabetes mellitus due to underlying condition with diabetic neuropathy, unspecified: Secondary | ICD-10-CM | POA: Diagnosis not present

## 2023-07-11 LAB — POCT GLYCOSYLATED HEMOGLOBIN (HGB A1C): Hemoglobin A1C: 6.2 % — AB (ref 4.0–5.6)

## 2023-07-11 MED ORDER — METFORMIN HCL ER 500 MG PO TB24: ORAL_TABLET | ORAL | 1 refills | Status: AC

## 2023-07-11 NOTE — Patient Instructions (Signed)
 Health Maintenance Due  Topic Date Due   FOOT EXAM  08/07/2020   DTaP/Tdap/Td (2 - Td or Tdap) 11/25/2020   COVID-19 Vaccine (4 - 2024-25 season) 10/23/2022   Diabetic kidney evaluation - Urine ACR  02/09/2023       05/01/2023   11:48 AM 06/06/2022    2:05 PM 04/20/2022   10:29 AM  Depression screen PHQ 2/9  Decreased Interest 0 0 0  Down, Depressed, Hopeless 0 3 0  PHQ - 2 Score 0 3 0  Altered sleeping  0   Tired, decreased energy  1   Change in appetite  0   Feeling bad or failure about yourself   0   Trouble concentrating  0   Moving slowly or fidgety/restless  0   Suicidal thoughts  0   PHQ-9 Score  4   Difficult doing work/chores  Not difficult at all

## 2023-07-11 NOTE — Progress Notes (Signed)
 Subjective:    Patient ID: Tyler Greer, male    DOB: 1935/08/30, 88 y.o.   MRN: [id number]  HPI 88 year old male who  has a past medical history of Arthritis, Basal cell carcinoma, arm, Basal cell carcinoma, face, Chest pain, Diabetes mellitus type II (10/23/2007), Intention tremor, Migraine, Osteoarthritis, Paresthesia of foot, and Stroke (HCC) (02/22/2003).   He presents to the office today for follow up regarding DM Type 2. He is currently maintained on Metformin  500 mg daily XR. He has been tolerating this medication well. He has not had any hypoglycemic events. He had right sided knee surgery about 3 months ago but continues to walk about a mile a day.   He reports that since the surgery he has had pretty significant pain in his right leg, he will get sharp burning pain that radiates down his leg and up into his groin and buttocks. Pain seems to be worse with resting. When he gets up after sitting or laying down his right leg will quiver due the pain. He believes that something went wrong during the surgery. He has seen Dr. Rossie Coon about it but did not get an answer Lab Results  Component Value Date   HGBA1C 6.7 (H) 02/23/2023    Review of Systems See HPI   Past Medical History:  Diagnosis Date   Arthritis    "hands and knees" (06/10/2015)   Basal cell carcinoma, arm    "both"   Basal cell carcinoma, face    Chest pain    Diabetes mellitus type II 10/23/2007   Intention tremor    Migraine    "none in years" (06/10/2015)   Osteoarthritis    Paresthesia of foot    bilateral   Stroke (HCC) 02/22/2003   mini stroke    Social History   Socioeconomic History   Marital status: Widowed    Spouse name: Not on file   Number of children: 2   Years of education: Not on file   Highest education level: Not on file  Occupational History   Occupation: Retired-Contractor  Tobacco Use   Smoking status: Never   Smokeless tobacco: Never   Tobacco comments:    "might have  smoked 1 pack of cigarettes in my life"  Vaping Use   Vaping status: Never Used  Substance and Sexual Activity   Alcohol use: No   Drug use: No   Sexual activity: Not Currently  Other Topics Concern   Not on file  Social History Narrative   Retired Product/process development scientist    Social Drivers of Health   Financial Resource Strain: Low Risk  (05/01/2023)   Overall Financial Resource Strain (CARDIA)    Difficulty of Paying Living Expenses: Not hard at all  Food Insecurity: No Food Insecurity (05/01/2023)   Hunger Vital Sign    Worried About Running Out of Food in the Last Year: Never true    Ran Out of Food in the Last Year: Never true  Transportation Needs: No Transportation Needs (05/01/2023)   PRAPARE - Administrator, Civil Service (Medical): No    Lack of Transportation (Non-Medical): No  Physical Activity: Insufficiently Active (05/01/2023)   Exercise Vital Sign    Days of Exercise per Week: 2 days    Minutes of Exercise per Session: 60 min  Stress: No Stress Concern Present (05/01/2023)   [employer] of Occupational Health - Occupational Stress Questionnaire    Feeling of Stress : Not at all  Social Connections: Moderately Integrated (05/01/2023)   Social Connection and Isolation Panel [NHANES]    Frequency of Communication with Friends and Family: More than three times a week    Frequency of Social Gatherings with Friends and Family: More than three times a week    Attends Religious Services: More than 4 times per year    Active Member of [employer] or Organizations: Yes    Attends Banker Meetings: More than 4 times per year    Marital Status: Widowed  Recent Concern: Social Connections - Socially Isolated (03/06/2023)   Social Connection and Isolation Panel [NHANES]    Frequency of Communication with Friends and Family: More than three times a week    Frequency of Social Gatherings with Friends and Family: More than three times a week    Attends Religious  Services: Never    Database administrator or Organizations: No    Attends Banker Meetings: Never    Marital Status: Widowed  Intimate Partner Violence: Not At Risk (05/01/2023)   Humiliation, Afraid, Rape, and Kick questionnaire    Fear of Current or Ex-Partner: No    Emotionally Abused: No    Physically Abused: No    Sexually Abused: No    Past Surgical History:  Procedure Laterality Date   HAMMER TOE SURGERY Bilateral    JOINT REPLACEMENT     KNEE ARTHROPLASTY Left 06/10/2015   Procedure: COMPUTER ASSISTED TOTAL KNEE ARTHROPLASTY;  Surgeon: Adah Acron, MD;  Location: [facility] OR;  Service: Orthopedics;  Laterality: Left;   KNEE ARTHROSCOPY Bilateral 02/22/2000   LUMBAR LAMINECTOMY  X 2   TONSILLECTOMY     TOTAL KNEE ARTHROPLASTY Right 03/06/2023   Procedure: TOTAL KNEE ARTHROPLASTY;  Surgeon: Liliane Rei, MD;  Location: [facility];  Service: Orthopedics;  Laterality: Right;    Family History  Problem Relation Age of Onset   Heart disease Father        died age 11-MI   Diabetes type II Brother    Melanoma Brother     No Known Allergies  Current Outpatient Medications on File Prior to Visit  Medication Sig Dispense Refill   cyanocobalamin  (VITAMIN B12) 1000 MCG tablet Take 1,000 mcg by mouth daily.     fluticasone  (FLONASE ) 50 MCG/ACT nasal spray Place 1 spray into both nostrils 2 (two) times daily. 16 g 3   metFORMIN  (GLUCOPHAGE -XR) 500 MG 24 hr tablet TAKE ONE TABLET DAILY WITH BREAKFAST 180 tablet 1   methocarbamol  (ROBAXIN ) 500 MG tablet Take 1 tablet (500 mg total) by mouth every 6 (six) hours as needed for muscle spasms. 40 tablet 0   ondansetron  (ZOFRAN ) 4 MG tablet Take 1 tablet (4 mg total) by mouth every 6 (six) hours as needed for nausea. 20 tablet 0   oxyCODONE  (OXY IR/ROXICODONE ) 5 MG immediate release tablet Take 1-2 tablets (5-10 mg total) by mouth every 6 (six) hours as needed for severe pain (pain score 7-10). 42 tablet 0   traMADol  (ULTRAM ) 50 MG  tablet Take 1-2 tablets (50-100 mg total) by mouth every 6 (six) hours as needed for moderate pain (pain score 4-6). 40 tablet 0   Zinc  50 MG TABS Take 50 mg by mouth daily.     No current facility-administered medications on file prior to visit.    BP 120/62   Pulse 86   Temp 97.9 F (36.6 C) (Oral)   SpO2 98%       Objective:   Physical Exam  Vitals and nursing note reviewed.  Constitutional:      Appearance: Normal appearance.  Cardiovascular:     Rate and Rhythm: Normal rate and regular rhythm.     Pulses: Normal pulses.     Heart sounds: Normal heart sounds.  Pulmonary:     Effort: Pulmonary effort is normal.     Breath sounds: Normal breath sounds.  Musculoskeletal:        General: Swelling (s/p right knee surgery) and tenderness present.     Right lower leg: No edema.     Left lower leg: No edema.  Skin:    General: Skin is warm and dry.  Neurological:     General: No focal deficit present.     Mental Status: He is alert and oriented to person, place, and time.  Psychiatric:        Mood and Affect: Mood normal.        Behavior: Behavior normal.        Thought Content: Thought content normal.        Judgment: Judgment normal.       Assessment & Plan:  1. Diabetes mellitus due to underlying condition with diabetic neuropathy, without long-term current use of insulin  (HCC) (Primary)  - POC HgB A1c- 6.2 - no change in medication therapy  - metFORMIN  (GLUCOPHAGE -XR) 500 MG 24 hr tablet; TAKE ONE TABLET DAILY WITH BREAKFAST  Dispense: 180 tablet; Refill: 1   2. Right leg pain - no signs of DVT  - Possibly muscular or nerve related.  - Can consider Flexeril  or Gabapentin - He would like to try OTC medication first and will let me know   Alto Atta, NP  Time spent with patient today was 32 minutes which consisted of chart review, discussing DM and right sided leg pain, work up, treatment answering questions and documentation.

## 2023-09-04 DIAGNOSIS — Z96651 Presence of right artificial knee joint: Secondary | ICD-10-CM | POA: Diagnosis not present

## 2023-09-04 DIAGNOSIS — Z471 Aftercare following joint replacement surgery: Secondary | ICD-10-CM | POA: Diagnosis not present

## 2023-10-16 DIAGNOSIS — Z96651 Presence of right artificial knee joint: Secondary | ICD-10-CM | POA: Diagnosis not present

## 2023-12-28 DIAGNOSIS — Z85828 Personal history of other malignant neoplasm of skin: Secondary | ICD-10-CM | POA: Diagnosis not present

## 2023-12-28 DIAGNOSIS — D1801 Hemangioma of skin and subcutaneous tissue: Secondary | ICD-10-CM | POA: Diagnosis not present

## 2023-12-28 DIAGNOSIS — L821 Other seborrheic keratosis: Secondary | ICD-10-CM | POA: Diagnosis not present

## 2023-12-28 DIAGNOSIS — C44629 Squamous cell carcinoma of skin of left upper limb, including shoulder: Secondary | ICD-10-CM | POA: Diagnosis not present

## 2023-12-28 DIAGNOSIS — L570 Actinic keratosis: Secondary | ICD-10-CM | POA: Diagnosis not present

## 2023-12-28 DIAGNOSIS — D485 Neoplasm of uncertain behavior of skin: Secondary | ICD-10-CM | POA: Diagnosis not present

## 2024-05-06 ENCOUNTER — Ambulatory Visit
